# Patient Record
Sex: Female | Born: 1935 | Race: White | Hispanic: No | Smoking: Former smoker
Health system: Southern US, Community
[De-identification: ages and names within clinical notes are randomized; demographics above are authoritative.]

## PROBLEM LIST (undated history)

## (undated) DIAGNOSIS — E041 Nontoxic single thyroid nodule: Secondary | ICD-10-CM

## (undated) DIAGNOSIS — C801 Malignant (primary) neoplasm, unspecified: Secondary | ICD-10-CM

## (undated) DIAGNOSIS — D179 Benign lipomatous neoplasm, unspecified: Secondary | ICD-10-CM

## (undated) DIAGNOSIS — E669 Obesity, unspecified: Secondary | ICD-10-CM

## (undated) DIAGNOSIS — K760 Fatty (change of) liver, not elsewhere classified: Secondary | ICD-10-CM

## (undated) DIAGNOSIS — M1990 Unspecified osteoarthritis, unspecified site: Secondary | ICD-10-CM

## (undated) DIAGNOSIS — E785 Hyperlipidemia, unspecified: Secondary | ICD-10-CM

## (undated) DIAGNOSIS — E042 Nontoxic multinodular goiter: Secondary | ICD-10-CM

## (undated) DIAGNOSIS — O020 Blighted ovum and nonhydatidiform mole: Secondary | ICD-10-CM

## (undated) DIAGNOSIS — K219 Gastro-esophageal reflux disease without esophagitis: Secondary | ICD-10-CM

## (undated) DIAGNOSIS — K5000 Crohn's disease of small intestine without complications: Secondary | ICD-10-CM

## (undated) DIAGNOSIS — E559 Vitamin D deficiency, unspecified: Secondary | ICD-10-CM

## (undated) DIAGNOSIS — N189 Chronic kidney disease, unspecified: Secondary | ICD-10-CM

## (undated) DIAGNOSIS — E119 Type 2 diabetes mellitus without complications: Secondary | ICD-10-CM

## (undated) DIAGNOSIS — I10 Essential (primary) hypertension: Secondary | ICD-10-CM

## (undated) DIAGNOSIS — J189 Pneumonia, unspecified organism: Secondary | ICD-10-CM

## (undated) DIAGNOSIS — F411 Generalized anxiety disorder: Secondary | ICD-10-CM

## (undated) DIAGNOSIS — D649 Anemia, unspecified: Secondary | ICD-10-CM

## (undated) DIAGNOSIS — F329 Major depressive disorder, single episode, unspecified: Secondary | ICD-10-CM

## (undated) HISTORY — DX: Obesity, unspecified: E66.9

## (undated) HISTORY — PX: APPENDECTOMY: SHX54

## (undated) HISTORY — DX: Fatty (change of) liver, not elsewhere classified: K76.0

## (undated) HISTORY — DX: Anemia, unspecified: D64.9

## (undated) HISTORY — DX: Generalized anxiety disorder: F41.1

## (undated) HISTORY — DX: Major depressive disorder, single episode, unspecified: F32.9

## (undated) HISTORY — DX: Hyperlipidemia, unspecified: E78.5

## (undated) HISTORY — DX: Crohn's disease of small intestine without complications: K50.00

## (undated) HISTORY — DX: Gastro-esophageal reflux disease without esophagitis: K21.9

## (undated) HISTORY — PX: MECKEL DIVERTICULUM EXCISION: SHX314

## (undated) HISTORY — DX: Type 2 diabetes mellitus without complications: E11.9

## (undated) HISTORY — DX: Vitamin D deficiency, unspecified: E55.9

## (undated) HISTORY — DX: Nontoxic multinodular goiter: E04.2

## (undated) HISTORY — PX: BIOPSY THYROID: PRO38

## (undated) HISTORY — DX: Essential (primary) hypertension: I10

## (undated) HISTORY — DX: Benign lipomatous neoplasm, unspecified: D17.9

---
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
## 1965-09-28 DIAGNOSIS — O020 Blighted ovum and nonhydatidiform mole: Secondary | ICD-10-CM

## 1965-09-28 HISTORY — DX: Blighted ovum and nonhydatidiform mole: O02.0

## 1990-09-28 DIAGNOSIS — K760 Fatty (change of) liver, not elsewhere classified: Secondary | ICD-10-CM

## 1990-09-28 HISTORY — DX: Fatty (change of) liver, not elsewhere classified: K76.0

## 1990-09-28 HISTORY — PX: LEFT OOPHORECTOMY: SHX1961

## 1999-02-07 ENCOUNTER — Other Ambulatory Visit: Admission: RE | Admit: 1999-02-07 | Discharge: 1999-02-07 | Payer: Self-pay | Admitting: Gastroenterology

## 2005-09-07 ENCOUNTER — Ambulatory Visit: Payer: Self-pay | Admitting: Gastroenterology

## 2005-09-14 ENCOUNTER — Ambulatory Visit: Payer: Self-pay | Admitting: Gastroenterology

## 2005-09-29 ENCOUNTER — Ambulatory Visit: Payer: Self-pay | Admitting: Endocrinology

## 2005-10-06 ENCOUNTER — Encounter: Admission: RE | Admit: 2005-10-06 | Discharge: 2006-01-04 | Payer: Self-pay | Admitting: Endocrinology

## 2005-10-08 ENCOUNTER — Ambulatory Visit (HOSPITAL_COMMUNITY): Admission: RE | Admit: 2005-10-08 | Discharge: 2005-10-08 | Payer: Self-pay | Admitting: Endocrinology

## 2005-10-12 ENCOUNTER — Ambulatory Visit: Payer: Self-pay | Admitting: Endocrinology

## 2005-10-19 ENCOUNTER — Encounter: Admission: RE | Admit: 2005-10-19 | Discharge: 2005-10-19 | Payer: Self-pay | Admitting: Endocrinology

## 2005-10-19 ENCOUNTER — Other Ambulatory Visit: Admission: RE | Admit: 2005-10-19 | Discharge: 2005-10-19 | Payer: Self-pay | Admitting: Interventional Radiology

## 2005-10-19 ENCOUNTER — Encounter (INDEPENDENT_AMBULATORY_CARE_PROVIDER_SITE_OTHER): Payer: Self-pay | Admitting: *Deleted

## 2005-10-20 ENCOUNTER — Ambulatory Visit: Payer: Self-pay | Admitting: Gastroenterology

## 2005-10-23 ENCOUNTER — Ambulatory Visit: Payer: Self-pay

## 2005-10-26 ENCOUNTER — Ambulatory Visit: Payer: Self-pay | Admitting: Endocrinology

## 2005-12-08 ENCOUNTER — Ambulatory Visit: Payer: Self-pay | Admitting: Gastroenterology

## 2005-12-12 ENCOUNTER — Emergency Department (HOSPITAL_COMMUNITY): Admission: EM | Admit: 2005-12-12 | Discharge: 2005-12-12 | Payer: Self-pay | Admitting: Emergency Medicine

## 2006-01-26 ENCOUNTER — Ambulatory Visit: Payer: Self-pay | Admitting: Endocrinology

## 2006-02-02 ENCOUNTER — Ambulatory Visit: Payer: Self-pay | Admitting: Endocrinology

## 2006-04-26 ENCOUNTER — Ambulatory Visit: Payer: Self-pay | Admitting: Endocrinology

## 2006-05-04 ENCOUNTER — Ambulatory Visit: Payer: Self-pay | Admitting: Endocrinology

## 2006-07-27 ENCOUNTER — Ambulatory Visit: Payer: Self-pay | Admitting: Endocrinology

## 2006-08-02 ENCOUNTER — Ambulatory Visit: Payer: Self-pay | Admitting: Endocrinology

## 2006-08-02 ENCOUNTER — Ambulatory Visit: Payer: Self-pay | Admitting: Gastroenterology

## 2006-08-10 ENCOUNTER — Ambulatory Visit: Payer: Self-pay | Admitting: Gastroenterology

## 2006-08-10 HISTORY — PX: COLONOSCOPY: SHX5424

## 2006-08-10 LAB — HM COLONOSCOPY: HM Colonoscopy: NORMAL

## 2007-01-21 ENCOUNTER — Encounter: Admission: RE | Admit: 2007-01-21 | Discharge: 2007-03-28 | Payer: Self-pay | Admitting: Endocrinology

## 2007-02-04 ENCOUNTER — Ambulatory Visit: Payer: Self-pay | Admitting: Endocrinology

## 2007-02-04 ENCOUNTER — Ambulatory Visit: Payer: Self-pay | Admitting: Internal Medicine

## 2007-02-04 LAB — CONVERTED CEMR LAB
AST: 29 units/L (ref 0–37)
BUN: 18 mg/dL (ref 6–23)
Basophils Absolute: 0.1 10*3/uL (ref 0.0–0.1)
CO2: 30 meq/L (ref 19–32)
Calcium: 9.9 mg/dL (ref 8.4–10.5)
Cholesterol: 140 mg/dL (ref 0–200)
Creatinine,U: 183.2 mg/dL
HCT: 34.6 % — ABNORMAL LOW (ref 36.0–46.0)
Hemoglobin, Urine: NEGATIVE
Hgb A1c MFr Bld: 6.8 % — ABNORMAL HIGH (ref 4.6–6.0)
LDL Cholesterol: 55 mg/dL (ref 0–99)
MCHC: 34.2 g/dL (ref 30.0–36.0)
MCV: 88.8 fL (ref 78.0–100.0)
Microalb, Ur: 1.1 mg/dL (ref 0.0–1.9)
Neutro Abs: 3.8 10*3/uL (ref 1.4–7.7)
Platelets: 271 10*3/uL (ref 150–400)
RBC: 3.9 M/uL (ref 3.87–5.11)
TSH: 1.8 microintl units/mL (ref 0.35–5.50)
Total Protein: 6.8 g/dL (ref 6.0–8.3)
Urobilinogen, UA: 0.2 (ref 0.0–1.0)
VLDL: 38 mg/dL (ref 0–40)
WBC: 7.4 10*3/uL (ref 4.5–10.5)
pH: 5.5 (ref 5.0–8.0)

## 2007-02-11 ENCOUNTER — Ambulatory Visit: Payer: Self-pay | Admitting: Endocrinology

## 2007-03-09 ENCOUNTER — Ambulatory Visit: Payer: Self-pay | Admitting: Gastroenterology

## 2007-03-09 LAB — CONVERTED CEMR LAB
Basophils Absolute: 0.1 10*3/uL (ref 0.0–0.1)
Folate: >20 ng/mL
HCT: 34.5 % — ABNORMAL LOW (ref 36.0–46.0)
Lymphocytes Relative: 26 % (ref 12.0–46.0)
MCHC: 35 g/dL (ref 30.0–36.0)
Monocytes Relative: 3.3 % (ref 3.0–11.0)
Neutro Abs: 5.8 10*3/uL (ref 1.4–7.7)
Saturation Ratios: 19.1 % — ABNORMAL LOW (ref 20.0–50.0)
Transferrin: 377.6 mg/dL — ABNORMAL HIGH (ref >=212.0)
Vitamin B-12: 292 pg/mL (ref 211–911)

## 2007-04-11 ENCOUNTER — Ambulatory Visit: Payer: Self-pay | Admitting: Internal Medicine

## 2007-04-27 ENCOUNTER — Encounter: Payer: Self-pay | Admitting: Endocrinology

## 2007-04-27 DIAGNOSIS — F411 Generalized anxiety disorder: Secondary | ICD-10-CM | POA: Insufficient documentation

## 2007-04-27 DIAGNOSIS — E119 Type 2 diabetes mellitus without complications: Secondary | ICD-10-CM

## 2007-04-27 DIAGNOSIS — F329 Major depressive disorder, single episode, unspecified: Secondary | ICD-10-CM

## 2007-04-27 DIAGNOSIS — F3289 Other specified depressive episodes: Secondary | ICD-10-CM

## 2007-04-27 DIAGNOSIS — I10 Essential (primary) hypertension: Secondary | ICD-10-CM | POA: Insufficient documentation

## 2007-04-27 HISTORY — DX: Other specified depressive episodes: F32.89

## 2007-04-27 HISTORY — DX: Generalized anxiety disorder: F41.1

## 2007-04-27 HISTORY — DX: Major depressive disorder, single episode, unspecified: F32.9

## 2008-01-19 DIAGNOSIS — K219 Gastro-esophageal reflux disease without esophagitis: Secondary | ICD-10-CM | POA: Insufficient documentation

## 2008-05-04 ENCOUNTER — Ambulatory Visit: Payer: Self-pay | Admitting: Endocrinology

## 2008-05-04 DIAGNOSIS — E042 Nontoxic multinodular goiter: Secondary | ICD-10-CM

## 2008-05-04 DIAGNOSIS — D649 Anemia, unspecified: Secondary | ICD-10-CM | POA: Insufficient documentation

## 2008-05-04 HISTORY — DX: Nontoxic multinodular goiter: E04.2

## 2008-05-04 HISTORY — DX: Anemia, unspecified: D64.9

## 2008-05-10 ENCOUNTER — Telehealth: Payer: Self-pay | Admitting: Endocrinology

## 2008-05-15 ENCOUNTER — Encounter: Payer: Self-pay | Admitting: Internal Medicine

## 2008-05-15 ENCOUNTER — Ambulatory Visit: Payer: Self-pay | Admitting: Endocrinology

## 2008-05-15 ENCOUNTER — Encounter: Admission: RE | Admit: 2008-05-15 | Discharge: 2008-05-15 | Payer: Self-pay | Admitting: Obstetrics and Gynecology

## 2008-06-08 ENCOUNTER — Ambulatory Visit: Payer: Self-pay | Admitting: Internal Medicine

## 2008-06-08 DIAGNOSIS — K5000 Crohn's disease of small intestine without complications: Secondary | ICD-10-CM

## 2008-06-08 HISTORY — DX: Crohn's disease of small intestine without complications: K50.00

## 2008-07-24 ENCOUNTER — Telehealth: Payer: Self-pay | Admitting: Endocrinology

## 2008-09-03 ENCOUNTER — Encounter: Payer: Self-pay | Admitting: Endocrinology

## 2008-09-07 ENCOUNTER — Encounter: Payer: Self-pay | Admitting: Endocrinology

## 2009-04-22 ENCOUNTER — Telehealth: Payer: Self-pay | Admitting: Endocrinology

## 2009-05-30 ENCOUNTER — Telehealth: Payer: Self-pay | Admitting: Endocrinology

## 2009-06-07 ENCOUNTER — Ambulatory Visit: Payer: Self-pay | Admitting: Internal Medicine

## 2009-06-07 ENCOUNTER — Encounter: Payer: Self-pay | Admitting: Endocrinology

## 2009-06-10 ENCOUNTER — Encounter: Payer: Self-pay | Admitting: Endocrinology

## 2009-06-17 ENCOUNTER — Ambulatory Visit: Payer: Self-pay | Admitting: Endocrinology

## 2009-06-23 ENCOUNTER — Encounter: Payer: Self-pay | Admitting: Endocrinology

## 2009-06-23 DIAGNOSIS — K7581 Nonalcoholic steatohepatitis (NASH): Secondary | ICD-10-CM | POA: Insufficient documentation

## 2009-07-08 ENCOUNTER — Ambulatory Visit: Payer: Self-pay | Admitting: Internal Medicine

## 2010-03-26 ENCOUNTER — Telehealth: Payer: Self-pay | Admitting: Endocrinology

## 2010-06-24 ENCOUNTER — Ambulatory Visit: Payer: Self-pay | Admitting: Endocrinology

## 2010-06-24 ENCOUNTER — Encounter: Payer: Self-pay | Admitting: Endocrinology

## 2010-06-24 DIAGNOSIS — E7800 Pure hypercholesterolemia, unspecified: Secondary | ICD-10-CM | POA: Insufficient documentation

## 2010-07-01 ENCOUNTER — Telehealth: Payer: Self-pay | Admitting: Endocrinology

## 2010-07-01 ENCOUNTER — Telehealth: Payer: Self-pay | Admitting: Internal Medicine

## 2010-07-29 ENCOUNTER — Telehealth: Payer: Self-pay | Admitting: Endocrinology

## 2010-08-01 ENCOUNTER — Ambulatory Visit: Payer: Self-pay | Admitting: Internal Medicine

## 2010-08-01 DIAGNOSIS — E669 Obesity, unspecified: Secondary | ICD-10-CM | POA: Insufficient documentation

## 2010-08-01 HISTORY — DX: Obesity, unspecified: E66.9

## 2010-10-26 LAB — CONVERTED CEMR LAB
Albumin: 4.2 g/dL (ref 3.5–5.2)
Alkaline Phosphatase: 100 units/L (ref 39–117)
Alkaline Phosphatase: 88 units/L (ref 39–117)
Alkaline Phosphatase: 94 units/L (ref 39–117)
BUN: 26 mg/dL — ABNORMAL HIGH (ref 6–23)
BUN: 27 mg/dL — ABNORMAL HIGH (ref 6–23)
Basophils Absolute: 0 10*3/uL (ref 0.0–0.1)
Bilirubin, Direct: 0.1 mg/dL (ref 0.0–0.3)
CO2: 27 meq/L (ref 19–32)
CO2: 28 meq/L (ref 19–32)
Calcium: 9.5 mg/dL (ref 8.4–10.5)
Chloride: 103 meq/L (ref 96–112)
Cholesterol: 178 mg/dL (ref 0–200)
Creatinine, Ser: 1.1 mg/dL (ref 0.4–1.2)
Creatinine, Ser: 1.2 mg/dL (ref 0.4–1.2)
Creatinine, Ser: 1.3 mg/dL — ABNORMAL HIGH (ref 0.4–1.2)
Creatinine,U: 336.8 mg/dL
Creatinine,U: 729.3 mg/dL
Crystals: NEGATIVE
Direct LDL: 51.8 mg/dL
Direct LDL: 71.6 mg/dL
Eosinophils Relative: 4.7 % (ref 0.0–5.0)
Eosinophils Relative: 4.8 % (ref 0.0–5.0)
Folate: >20 ng/mL
GFR calc Af Amer: 63 mL/min
GFR calc non Af Amer: 52 mL/min
Glucose, Bld: 140 mg/dL — ABNORMAL HIGH (ref 70–99)
HCT: 34.8 % — ABNORMAL LOW (ref 36.0–46.0)
HCT: 37.5 % (ref 36.0–46.0)
Hemoglobin, Urine: NEGATIVE
Hemoglobin: 11.9 g/dL — ABNORMAL LOW (ref 12.0–15.0)
Hemoglobin: 12 g/dL (ref 12.0–15.0)
Hgb A1c MFr Bld: 7.4 % — ABNORMAL HIGH (ref 4.6–6.5)
Lymphocytes Relative: 25.6 % (ref 12.0–46.0)
Lymphocytes Relative: 27.8 % (ref 12.0–46.0)
Lymphocytes Relative: 29.2 % (ref 12.0–46.0)
MCHC: 33.9 g/dL (ref 30.0–36.0)
Microalb Creat Ratio: 2.4 mg/g (ref 0.0–30.0)
Microalb Creat Ratio: 2.6 mg/g (ref 0.0–30.0)
Microalb, Ur: 17.6 mg/dL — ABNORMAL HIGH (ref 0.0–1.9)
Monocytes Absolute: 0.4 10*3/uL (ref 0.1–1.0)
Monocytes Relative: 6 % (ref 3.0–12.0)
Neutro Abs: 4.7 10*3/uL (ref 1.4–7.7)
Neutro Abs: 5 10*3/uL (ref 1.4–7.7)
Neutrophils Relative %: 62.5 % (ref 43.0–77.0)
Neutrophils Relative %: 63.4 % (ref 43.0–77.0)
Nitrite: NEGATIVE
Nitrite: NEGATIVE
Platelets: 213 10*3/uL (ref 150–400)
Platelets: 235 10*3/uL (ref 150.0–400.0)
Platelets: 250 10*3/uL (ref 150.0–400.0)
Potassium: 4.5 meq/L (ref 3.5–5.1)
RBC: 4.19 M/uL (ref 3.87–5.11)
RDW: 12.5 % (ref 11.5–14.6)
RDW: 13.1 % (ref 11.5–14.6)
Sodium: 139 meq/L (ref 135–145)
Specific Gravity, Urine: >=1.03 (ref 1.000–1.030)
TSH: 2.33 microintl units/mL (ref 0.35–5.50)
TSH: 2.68 microintl units/mL (ref 0.35–5.50)
Total Bilirubin: 0.5 mg/dL (ref 0.3–1.2)
Total Bilirubin: 0.8 mg/dL (ref 0.3–1.2)
Total CHOL/HDL Ratio: 2.7
Total CHOL/HDL Ratio: 4
Total Protein, Urine: NEGATIVE mg/dL
Total Protein: 7.5 g/dL (ref 6.0–8.3)
Total Protein: 7.8 g/dL (ref 6.0–8.3)
Transferrin: 363.1 mg/dL — ABNORMAL HIGH (ref >=212.0)
Transferrin: 372.8 mg/dL — ABNORMAL HIGH (ref 212.0–360.0)
Triglycerides: 222 mg/dL — ABNORMAL HIGH (ref 0.0–149.0)
Triglycerides: 272 mg/dL (ref 0–149)
Urine Glucose: NEGATIVE mg/dL
Urine Glucose: NEGATIVE mg/dL
Urobilinogen, UA: 0.2 (ref 0.0–1.0)
Urobilinogen, UA: 0.2 (ref 0.0–1.0)
VLDL: 54 mg/dL — ABNORMAL HIGH (ref 0–40)
Vitamin B-12: 1420 pg/mL — ABNORMAL HIGH (ref 211–911)
WBC: 7.9 10*3/uL (ref 4.5–10.5)
WBC: 7.9 10*3/uL (ref 4.5–10.5)
pH: 5 (ref 5.0–8.0)
pH: 5 (ref 5.0–8.0)
pH: 5 (ref 5.0–8.0)

## 2010-10-28 NOTE — Progress Notes (Signed)
Summary: metformin/prandin/actos rx  Phone Note Refill Request Message from:  Fax from Pharmacy  Refills Requested: Medication #1:  ACTOS 45 MG  TABS take 1 by mouth qd   Dosage confirmed as above?Dosage Confirmed   Last Refilled: 07/24/2009  Medication #2:  PRANDIN 2 MG  TABS take 1 three times a day qd   Dosage confirmed as above?Dosage Confirmed   Last Refilled: 07/24/2009  Medication #3:  METFORMIN HCL 500 MG  TB24 take 2 by mouth bid   Dosage confirmed as above?Dosage Confirmed   Last Refilled: 07/24/2009 Initial call taken by: Rebeca Alert MA,  March 26, 2010 9:14 AM    Prescriptions: PRANDIN 2 MG  TABS (REPAGLINIDE) take 1 three times a day qd  #270 x 0   Entered by:   Rebeca Alert MA   Authorized by:   Donavan Foil MD   Signed by:   Rebeca Alert MA on 03/26/2010   Method used:   Faxed to ...       [facility] (retail)       [address] [place], [place]  [id number]       Ph: [phone]       Fax: [phone]   RxID:   [id number] METFORMIN HCL 500 MG  TB24 (METFORMIN HCL) take 2 by mouth bid  #360 x 0   Entered by:   Rebeca Alert MA   Authorized by:   Donavan Foil MD   Signed by:   Rebeca Alert MA on 03/26/2010   Method used:   Faxed to ...       [facility] (retail)       [address], [place]  [address]       Ph: [phone]       Fax: [phone]   RxID:   [id number] ACTOS 45 MG  TABS (PIOGLITAZONE HCL) take 1 by mouth qd  #90 x 0   Entered by:   Rebeca Alert MA   Authorized by:   Donavan Foil MD   Signed by:   Rebeca Alert MA on 03/26/2010   Method used:   Faxed to ...       [facility] (retail)       [address], [place]  [id number]       Ph: [phone]       Fax: [phone]   RxID:   [id number]

## 2010-10-28 NOTE — Assessment & Plan Note (Signed)
Summary: yearly check up...em   History of Present Illness Visit Type: Follow-up Visit Primary GI MD: Silvano Rusk MD [facility] Primary Lene Mckay: Luisa Hart Requesting Khalid Lacko: n/a Chief Complaint: Crohn's and GERD History of Present Illness:   75 yo ww with Crohn's disease in remission and GERD. She has no diarrhea or abdominal pain problems. She does well on very low-dose Pentasa and Prilosec OTC.         Preventive Screening-Counseling & Management  Caffeine-Diet-Exercise     Diet Counseling: to improve diet; diet is suboptimal     Nutrition Referrals: yes     Does Patient Exercise: no     Exercise Counseling: to improve exercise regimen  Comments: Asvised weight watchers    Clinical Reports Reviewed:  Colonoscopy:  08/10/2006:  Normal Exam Performed by Dr. Velora Heckler   Current Medications (verified): 1)  Multivitamins   Tabs (Multiple Vitamin) .... Take 1 By Mouth Qd 2)  Actos 45 Mg  Tabs (Pioglitazone Hcl) .... Take 1 By Mouth Qd 3)  Metformin Hcl 500 Mg  Tb24 (Metformin Hcl) .... Take 2 By Mouth Bid 4)  Prandin 2 Mg  Tabs (Repaglinide) .... Take 1 Three Times A Day Qd 5)  Micardis Hct 80-25 Mg  Tabs (Telmisartan-Hctz) .... Take 1 By Mouth Qd 6)  Folic Acid 093 Mcg Tabs (Folic Acid) .Marland Kitchen.. 1 Tablet By Mouth Once Daily 7)  Pravastatin Sodium 40 Mg  Tabs (Pravastatin Sodium) .... Take 1 By Mouth Qhs 8)  Metoprolol Tartrate 25 Mg  Tabs (Metoprolol Tartrate) .... 1/2 Tab Bid 9)  Alprazolam 0.25 Mg  Tabs (Alprazolam) .... As Needed Anxiety 10)  Pentasa 500 Mg Cr-Caps (Mesalamine) .... Take 1 Two Times A Day 11)  Vitamin B-12 2500 Mcg Subl (Cyanocobalamin) .Marland Kitchen.. 1 By Mouth in The Morning 12)  Prilosec Otc 20 Mg  Tbec (Omeprazole Magnesium) .Marland Kitchen.. 1 Each Day 30 Minutes Before Meal 13)  Glucose Test Strips .... Once Daily, and Lancets, Any Brand 250.00 14)  Biotin 5000 5 Mg Caps (Biotin) .Marland Kitchen.. 1 Tab Qam 15)  Onetouch Test  Strp (Glucose Blood) .... Check Blood Sugar Once  Daily 250.00  Allergies (verified): No Known Drug Allergies  Past History:  Past Medical History: Diabetes mellitus, type II Crohn's Disease, ileum, cecum Mulit-Nodular Goiter Cough due to Zestril Hypertension Obesity Mild Anemia Fatty liver Anxiety Depression THYROID BX  specialists: gessner henley bertrand jacklin  Past Surgical History: Reviewed history from 01/19/2008 and no changes required. Stress Cardiolite (10/23/2005) EKG (02/11/2007) Appendectomy Ileal and cecal resection w/Meckel's diverticulum resection and left oophorectomy(1992)  Family History: Reviewed history from 06/08/2008 and no changes required. mother had breast cancer at age 22 father had skin cancer Family History of Colon Cancer: uncle Family History of Kidney Disease:mother Family History of Diabetes:   Social History: Reviewed history from 06/24/2010 and no changes required. 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  Alcohol Use - no Illicit Drug Use - no Patient does not get regular exercise.   Review of Systems       joint pains, especially lower extremities she has had some falls with temporary injury  Vital Signs:  Patient profile:   75 year old female Height:      67 inches Weight:      244.13 pounds BMI:     38.37 Pulse rate:   90 / minute Pulse rhythm:   regular BP sitting:   124 / 64  (left arm) Cuff size:   large  Vitals Entered By: Marlon Pel CMA Deborra Medina) (August 01, 2010 4:04 PM)  Physical Exam  General:  obese.  NAD Lungs:  Clear throughout to auscultation. Heart:  Regular rate and rhythm; no murmurs, rubs,  or bruits. Abdomen:  obese, soft non-tener without obvious HSM no masses or herniae midline scar Psych:  Alert and cooperative. Normal mood and affect.   Impression & Recommendations:  Problem # 1:  CROHN'S DISEASE, SMALL INTESTINE (ICD-555.0) Assessment Unchanged  Problem # 2:  GERD (ICD-530.81) Assessment:  Unchanged  Problem # 3:  OBESITY (ICD-278.00) Assessment: Deteriorated she is gaining weight she had trie Alli but ended up cooking and eating more she thinks is aware of problem and had lost in the past with Weight Watchers - recommended to retry that advised that even a 10# weight loss could help with comorbidities due to obesitty she was receptive  Problem # 4:  DIABETES MELLITUS, TYPE II (ICD-250.00) Assessment: Comment Only We reviewed her Hgb A1C She asked why she was only getting it checked annually.  Patient Instructions: 1)  Your Pentasa and Prilosec OTC have been refilled for 1 year. 2)  You should have a colonoscpy in (around) November 2012 - you should receive a letter. Dr. Carlean Purl can see you next at that time and sooner if needed. 3)  Try Weight Watchers to lose weight and here are some other hnts. 4)  You need to lose weight. Start by limiting portions, amounts. Avoid eating when not hungry. Limit desserts.Look for high fructose corn syrup on food labels and if in first 3 ingredients, avoid that food. Also try to eat whole grains, avoid "white foods" (e.g. white rice, white bread).   5)  The medication list was reviewed and reconciled.  All changed / newly prescribed medications were explained.  A complete medication list was provided to the patient / caregiver. Prescriptions: PRILOSEC OTC 20 MG  TBEC (OMEPRAZOLE MAGNESIUM) 1 each day 30 minutes before meal  #30 x 11   Entered and Authorized by:   Gatha Mayer MD, [facility]   Signed by:   Gatha Mayer MD, FACG on 08/01/2010   Method used:   Electronically to        [facility] (retail)       [address], [place]  [address]       Ph: [phone]       Fax: [phone]   RxID:   [id number] PENTASA 500 MG CR-CAPS (MESALAMINE) take 1 two times a day  #60 Each x 11   Entered and Authorized by:   Gatha Mayer MD, [facility]   Signed by:   Gatha Mayer MD, FACG on 08/01/2010   Method  used:   Electronically to        [facility] (retail)       [place], [place]  [address]       Ph: [phone]       Fax: [phone]   RxID:   [id number]

## 2010-10-28 NOTE — Assessment & Plan Note (Signed)
Summary: YEARLY FU/ MEDICARE /NWS  #   Vital Signs:  Patient profile:   75 year old female Height:      67 inches (170.18 cm) Weight:      238 pounds (108.18 kg) BMI:     37.41 O2 Sat:      96 % on Room air Temp:     98.5 degrees F (36.94 degrees C) oral Pulse rate:   88 / minute Pulse rhythm:   regular BP sitting:   108 / 64  (left arm) Cuff size:   large  Vitals Entered By: Rebeca Alert MA (June 24, 2010 8:35 AM)  O2 Flow:  Room air CC: Yearly Medicare Visit/pt is no longer taking Alli/aj Is Patient Diabetic? Yes   Primary Provider:  Luisa Hart  CC:  Yearly Medicare Visit/pt is no longer taking Alli/aj.  History of Present Illness: pt is here for medicare welllness visit.  she denies memory loss and depression.  she says she is able to perform activities of daily living without assistance.  she has no limitations to physical activity.   Current Medications (verified): 1)  Multivitamins   Tabs (Multiple Vitamin) .... Take 1 By Mouth Qd 2)  Actos 45 Mg  Tabs (Pioglitazone Hcl) .... Take 1 By Mouth Qd 3)  Metformin Hcl 500 Mg  Tb24 (Metformin Hcl) .... Take 2 By Mouth Bid 4)  Prandin 2 Mg  Tabs (Repaglinide) .... Take 1 Three Times A Day Qd 5)  Micardis Hct 80-25 Mg  Tabs (Telmisartan-Hctz) .... Take 1 By Mouth Qd 6)  Folic Acid 840 Mcg Tabs (Folic Acid) .Marland Kitchen.. 1 Tablet By Mouth Once Daily 7)  Pravastatin Sodium 40 Mg  Tabs (Pravastatin Sodium) .... Take 1 By Mouth Qhs 8)  Metoprolol Tartrate 25 Mg  Tabs (Metoprolol Tartrate) .... 1/2 Tab Bid 9)  Alprazolam 0.25 Mg  Tabs (Alprazolam) .... As Needed Anxiety 10)  Pentasa 500 Mg Cr-Caps (Mesalamine) .... Take 1 Two Times A Day 11)  Alli .Marland Kitchen.. 1 Before Each Meal 12)  Vitamin B-12 2500 Mcg Subl (Cyanocobalamin) .Marland Kitchen.. 1 By Mouth in The Morning 13)  Prilosec Otc 20 Mg  Tbec (Omeprazole Magnesium) .Marland Kitchen.. 1 Each Day 30 Minutes Before Meal 14)  Glucose Test Strips .... Once Daily, and Lancets, Any Brand 250.00 15)  Biotin  5000 5 Mg Caps (Biotin) .Marland Kitchen.. 1 Tab Qam 16)  Onetouch Test  Strp (Glucose Blood) .... Check Blood Sugar Once Daily 250.00  Allergies (verified): No Known Drug Allergies  Past History:  Past Medical History: Diabetes mellitus, type II Crohn's Disease, ileum, cecum NASH Mulit-Nodular Goiter Cough due to Zestril Hypertension Obesity Mild Anemia Fatty liver Anxiety Depression THYROID BX  specialists: gessner henley bertrand jacklin  Family History: Reviewed history from 06/08/2008 and no changes required. mother had breast cancer at age 66 father had skin cancer Family History of Colon Cancer: uncle Family History of Kidney Disease:mother Family History of Diabetes:   Social History: Reviewed history from 06/08/2008 and no changes required. 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  Alcohol Use - no Illicit Drug Use - no Patient does not get regular exercise.   Review of Systems       normal vision with glasses  Physical Exam  General:  normal appearance.   Eyes:  (sees opthal) Breasts:  sees gyn  Rectal:  sees gyn  Genitalia:  sees gyn  Msk:  pt easily and quickly performs "get-up-and-go" from a sitting position Psych:  remembers 3/3 at 5 minutes.  excellent recall.  can easily read and write a sentence.  alert and oriented x 3    Impression & Recommendations:  Problem # 1:  ROUTINE GENERAL MEDICAL EXAM@HEALTH  CARE FACL (ICD-V70.0)  Medications Added to Medication List This Visit: 1)  Folic Acid 132 Mcg Tabs (Folic acid) .Marland Kitchen.. 1 tablet by mouth once daily 2)  Vitamin B-12 2500 Mcg Subl (Cyanocobalamin) .Marland Kitchen.. 1 by mouth in the morning  Other Orders: EKG w/ Interpretation (93000) TLB-Lipid Panel (80061-LIPID) TLB-BMP (Basic Metabolic Panel-BMET) (44010-UVOZDGU) TLB-CBC Platelet - w/Differential (85025-CBCD) TLB-Hepatic/Liver Function Pnl (80076-HEPATIC) TLB-TSH (Thyroid Stimulating Hormone) (84443-TSH) TLB-A1C / Hgb A1C  (Glycohemoglobin) (83036-A1C) TLB-Microalbumin/Creat Ratio, Urine (82043-MALB) TLB-Udip w/ Micro (81001-URINE) TLB-IBC Pnl (Iron/FE;Transferrin) (83550-IBC) TLB-B12, Serum-Total ONLY (44034-V42) Medicare -1st Annual Wellness Visit [phone])   Patient Instructions: 1)  blood tests are being ordered for you today.  please call [phone] to hear your test results. 2)  please consider these measures for your health:  minimize alcohol.  do not use tobacco products.  have a colonoscopy at least every 10 years from age 35.  keep firearms safely stored.  always use seat belts.  have working smoke alarms in your home.  see an eye doctor and dentist regularly.  never drive under the influence of alcohol or drugs (including prescription drugs).  those with fair skin should take precautions against the sun. 3)  please let me know what your wishes would be, if artificial life support measures should become necessary.  it is critically important to prevent falling down (keep floor areas well-lit, dry, and free of loose objects) 4)  Please schedule a follow-up appointment in 6 months. 5)  it is critically important to maintain a healthy body weight.  i would be happy to refer you to a dietician.  we are checking your diabetes and cholesterol today. 6)  here is a list of medicare-covered preventive services. 7)  (update: i left message on phone-tree:  i advised addition of parlodel or welchol).

## 2010-10-28 NOTE — Progress Notes (Signed)
  Phone Note Refill Request Message from:  Fax from Pharmacy on July 01, 2010 10:36 AM  Refills Requested: Medication #1:  ACTOS 45 MG  TABS take 1 by mouth qd   Dosage confirmed as above?Dosage Confirmed   Last Refilled: 02/2010   Notes: [facility] Initial call taken by: Shirlean Mylar Ewing CMA Deborra Medina),  July 01, 2010 10:37 AM    Prescriptions: ACTOS 45 MG  TABS (PIOGLITAZONE HCL) take 1 by mouth qd  #90 x 3   Entered by:   Sharon Seller CMA ([place])   Authorized by:   Donavan Foil MD   Signed by:   Sharon Seller CMA ([place]) on 07/01/2010   Method used:   Faxed to ...       [facility] (retail)       [address], [place]  [id number]       Ph: [phone]       Fax: [phone]   RxID:   [id number]

## 2010-10-28 NOTE — Progress Notes (Signed)
  Phone Note Refill Request Message from:  Fax from Pharmacy on July 01, 2010 10:38 AM  Refills Requested: Medication #1:  METOPROLOL TARTRATE 25 MG  TABS 1/2 tab bid   Dosage confirmed as above?Dosage Confirmed   Notes: [facility] Initial call taken by: Shirlean Mylar Ewing CMA Deborra Medina),  July 01, 2010 10:38 AM    Prescriptions: METOPROLOL TARTRATE 25 MG  TABS (METOPROLOL TARTRATE) 1/2 tab bid  #30 Each x 10   Entered by:   Shirlean Mylar Ewing CMA ([place])   Authorized by:   Donavan Foil MD   Signed by:   Sharon Seller CMA ([place]) on 07/01/2010   Method used:   Faxed to ...       [facility] (retail)       [address], [place]  [id number]       Ph: [phone]       Fax: [phone]   RxID:   [id number]

## 2010-10-28 NOTE — Progress Notes (Signed)
  Phone Note Refill Request Message from:  Fax from Pharmacy on July 01, 2010 11:34 AM  Refills Requested: Medication #1:  MICARDIS HCT 80-25 MG  TABS take 1 by mouth qd   Dosage confirmed as above?Dosage Confirmed   Last Refilled: 08/26/2009   Notes: [facility] Initial call taken by: Shirlean Mylar Ewing CMA Deborra Medina),  July 01, 2010 11:34 AM    Prescriptions: MICARDIS HCT 80-25 MG  TABS (TELMISARTAN-HCTZ) take 1 by mouth qd  #30 Each x 11   Entered by:   Shirlean Mylar Ewing CMA ([place])   Authorized by:   Biagio Borg MD   Signed by:   Sharon Seller CMA ([place]) on 07/01/2010   Method used:   Faxed to ...       [facility] (retail)       [address] [place], [place]  [address]       Ph: [phone]       Fax: [phone]   RxID:   [id number]

## 2010-10-28 NOTE — Progress Notes (Signed)
Summary: prandin  Phone Note Refill Request Message from:  Pharmacy on July 29, 2010 8:36 AM  Refills Requested: Medication #1:  PRANDIN 2 MG  TABS take 1 three times a day qd   Dosage confirmed as above?Dosage Confirmed   Last Refilled: 06/20/2010  Method Requested: Electronic Initial call taken by: Rebeca Alert CMA Deborra Medina),  July 29, 2010 8:37 AM    Prescriptions: PRANDIN 2 MG  TABS (REPAGLINIDE) take 1 three times a day qd  #270 x 2   Entered by:   Rebeca Alert CMA ([place])   Authorized by:   Donavan Foil MD   Signed by:   Rebeca Alert CMA ([place]) on 07/29/2010   Method used:   Electronically to        [facility] (retail)       [address] [place], [place]  [id number]       Ph: [phone]       Fax: [phone]   RxID:   [id number]

## 2010-12-22 ENCOUNTER — Other Ambulatory Visit (INDEPENDENT_AMBULATORY_CARE_PROVIDER_SITE_OTHER): Payer: Medicare Other

## 2010-12-22 ENCOUNTER — Ambulatory Visit (INDEPENDENT_AMBULATORY_CARE_PROVIDER_SITE_OTHER): Payer: Medicare Other | Admitting: Endocrinology

## 2010-12-22 ENCOUNTER — Other Ambulatory Visit (INDEPENDENT_AMBULATORY_CARE_PROVIDER_SITE_OTHER): Payer: Medicare Other | Admitting: Endocrinology

## 2010-12-22 ENCOUNTER — Encounter: Payer: Self-pay | Admitting: Endocrinology

## 2010-12-22 DIAGNOSIS — E119 Type 2 diabetes mellitus without complications: Secondary | ICD-10-CM

## 2010-12-22 DIAGNOSIS — I10 Essential (primary) hypertension: Secondary | ICD-10-CM

## 2010-12-22 DIAGNOSIS — N289 Disorder of kidney and ureter, unspecified: Secondary | ICD-10-CM

## 2010-12-22 LAB — BASIC METABOLIC PANEL
GFR: 41.98 mL/min — ABNORMAL LOW (ref >60.00)
Potassium: 4.9 mEq/L (ref 3.5–5.1)

## 2010-12-22 LAB — HEMOGLOBIN A1C: Hgb A1c MFr Bld: 8.2 % — ABNORMAL HIGH (ref 4.6–6.5)

## 2010-12-22 MED ORDER — BROMOCRIPTINE MESYLATE 2.5 MG PO TABS: 2.5000 mg | ORAL_TABLET | Freq: Every day | ORAL | Status: DC

## 2010-12-22 MED ORDER — LOSARTAN POTASSIUM-HCTZ 100-25 MG PO TABS: 1.0000 | ORAL_TABLET | Freq: Every day | ORAL | Status: DC

## 2010-12-22 MED ORDER — ALPRAZOLAM 0.25 MG PO TABS: 0.2500 mg | ORAL_TABLET | Freq: Three times a day (TID) | ORAL | Status: DC | PRN

## 2010-12-22 NOTE — Patient Instructions (Addendum)
blood tests are being ordered for you today.  please call [phone] to hear your test results. pending the test results, please change micardis-hct to hyzaar 1 tab daily.  Please schedule a physical in 6 months. Here is a refill of your alprazolam.

## 2010-12-22 NOTE — Progress Notes (Signed)
  Subjective:    Patient ID: Marie Rhodes, female    DOB: Jul 16, 1936, 75 y.o.   MRN: [id number]  HPI The state of at least three ongoing medical problems is addressed today: Dm:  She is unaware of edema Htn:  She says her med is too expensive.  Denies chest pain. Renal insufficiency.  Denies sob Past Medical History  Diagnosis Date  . Multinodular goiter   . Fatty liver   . DIABETES MELLITUS, TYPE II 04/27/2007  . CROHN'S DISEASE, SMALL INTESTINE 06/08/2008    ileum, cecum  . GOITER, MULTINODULAR 05/04/2008  . ANXIETY 04/27/2007  . UNSPECIFIED ANEMIA 05/04/2008    Mild Anemia  . HYPERTENSION 04/27/2007  . OBESITY 08/01/2010  . DEPRESSION 04/27/2007   Past Surgical History  Procedure Date  . Appendectomy   . Meckel diverticulum excision     Ileal and cecal resection   . Left oophorectomy 1992  . Biopsy thyroid     reports that she has quit smoking. She does not have any smokeless tobacco history on file. She reports that she does not drink alcohol or use illicit drugs. family history includes Cancer in her father and other; Cancer (age of onset:78) in her mother; Diabetes in her other; and Kidney disease in her mother. Allergies not on file  Review of Systems She reports weight gain.  No numbness.   Objective:   Physical Exam GENERAL: no distress Pulses: dorsalis pedis intact bilat.   Extremities: no deformity.  no ulcer on the feet.  feet are of normal color and temp.  There is trace bilat edema. Neuro: sensation is intact to touch on the feet         Assessment & Plan:  Real insuff, uncertain etiology Dm.  She may need addition of prlodel Htn:  She wants a cheaper med.

## 2010-12-22 NOTE — Progress Notes (Signed)
Addended by: Renato Shin on: 12/22/2010 06:43 PM   Modules accepted: Orders

## 2011-01-09 ENCOUNTER — Other Ambulatory Visit: Payer: Self-pay | Admitting: Endocrinology

## 2011-01-12 NOTE — Telephone Encounter (Signed)
Please advise 

## 2011-01-12 NOTE — Telephone Encounter (Signed)
Please add to med list and refill x 1, thank you

## 2011-01-13 ENCOUNTER — Encounter: Payer: Self-pay | Admitting: Endocrinology

## 2011-01-13 NOTE — Telephone Encounter (Signed)
A user error has taken place: encounter opened in error, closed for administrative reasons.

## 2011-02-10 NOTE — Assessment & Plan Note (Signed)
[facility] OFFICE NOTE   Rhodes, ZILBERMAN                      MRN:          [id number]  DATE:04/11/2007                            DOB:          04/07/1936    CHIEF COMPLAINT:  Followup of prior anemia and Crohn's disease, previous  patient of Dr. Franki Cabot.   PROBLEMS:  1. Crohn's disease, status post ileal resection and cecal resection      1992 with little, if any, symptoms since then.  Maintained on      Pentasa 50 mg b.i.d.  2. Meckel's diverticulum resected at the same time.  3. Obesity.  4. Diabetes mellitus type 2 followed by Dr. Loanne Drilling.  5. Fatty liver.  6. Multi-nodular goiter.  7. Cough due to Zestril.  8. Hypertension.  9. Prior appendectomy 1991.  10.Anxiety/depression, she is a widow.  11.History of mild anemia, normocytic indices.  Hemoglobin was 11.9 on      Feb 04, 2007; 12.1 on March 09, 2007.   She says her stools have turned coal colored on iron in the  multivitamin.  She is frustrated about her weight issues, though she  wants to try to lose and she had done well on Weight Watchers for a week  but did not stick with it but says she is going to go back to that.  Iron saturation was 19% with transferrin 377 which was slightly higher  on iron with 101 but her ferritin was 106.  B-12 was 292, folate greater  than 20.  She has started on a multivitamin Centrum Silver, as well as  vitamin B-12 p.o. 500 mcg daily.  She wonders if that is enough as she  was not given specific instructions on that.  Other medications are  listed and reviewed in the chart.  Actos has caused her to gain weight  she thinks and Dr. Loanne Drilling has told her that is good for her fatty liver  probably and that the next step would insulin and that would make her  gain weight as well.   THERE ARE NO KNOWN DRUG ALLERGIES.   PHYSICAL EXAMINATION:  Shows a weight of 216 pounds, pulse 100, blood  pressure  138/84.  ABDOMEN:  Soft, nontender, well-healed surgical scars, no  hepatosplenomegaly or mass.  No stigmata of cirrhosis on the skin.   ASSESSMENT:  1. History of Crohn's ileitis.  She has done very well with low dose      Pentasa since that time.  2. Low normal B-12 level.  3. There is fatigue, weight gain, obesity, diabetes, probably      multifactorial as far the fatigue.  She certainly has some      depressive symptomatology.  4. Family history of colon cancer in a second degree relative (uncle).      Her last colonoscopy was in 2007 and was fine into the neo-terminal      ileum.   PLAN:  1. I am going to give her a one time injection of vitamin B-12 at 1000      mcg and she will continue p.o.  1000 mcg daily.  We will need to      monitor her B-12 level at least yearly.  2. Continue her Pentasa 500 mg b.i.d., she has been on that for 10      years.  3. Continue ongoing medical care with Dr. Loanne Drilling.  4. Regarding this anemia, I would not be surprised if there was not an      element of some chronic disease anemia though it is trivial.  5. We discussed weight loss and portion control and caloric intake as      the main way to lose weight, though exercise is encouraged as it is      beneficial to her mood and losing weight.   I will see her back as needed at this point.     Gatha Mayer, MD,FACG  Electronically Signed    CEG/MedQ  DD: 04/11/2007  DT: 04/11/2007  Job #: [id number]   cc:   Hilliard Clark A. Loanne Drilling, MD

## 2011-02-10 NOTE — Assessment & Plan Note (Signed)
[facility] OFFICE NOTE   TASHONNA, DESCOTEAUX                      MRN:          [id number]  DATE:03/09/2007                            DOB:          [birth year]    Marie Rhodes comes in and says she has been a little bit anemic and been on  some Aciphex and wondered about her hemoglobin dropping with a normal  MCV.  We assessed that with her.  She has had resection of terminal  ileum.  I told her it could be some B12 deficiency and may be folate  because she has been on Pentasa which she takes 250 mg, 2 t.i.d. along  with Aciphex one a day which she has for quite some time.  She takes  Centrum Silver and Glucophage and Actos and Micardis.   ON PHYSICAL EXAMINATION:  GENERAL:  She looks good.  VITAL SIGNS:  She weighs 215.  Blood pressure 110/60.  Pulse 120.  OROPHARYNX:  Negative.  NECK:  Negative.  CHEST:  Clear.  HEART:  Revealed a regular rhythm.  ABDOMEN:  Soft.  No masses organomegaly.  Nontender.   IMPRESSION:  1. Status post Crohn's disease and __________  diverticulum with      resection of the terminal ileum as described.  2. Gastroesophageal reflux disease.  3. Mild obesity.  4. History of steatosis.  5. Mild anxiety and depression.  6. Diabetes.  7. Anemia of questionable type.   RECOMMENDATIONS:  My recommendation is that she have a CBC and anemia  profile, check on her B12, folate, and iron studies.  She was told  to  take some iron, B12 and folate in the meantime and I am going to have  her followup with Dr. Carlean Purl in the next few weeks because of this.  I  am concerned that she might have a mixed picture here and therefore the  iron deficiency and __________  B12 because of her resection of the  terminal ileum.  I am sure Dr. Carlean Purl can get this all under control.     Clarene Reamer, MD  Electronically Signed    SML/MedQ  DD: 03/09/2007  DT: 03/09/2007  Job #: [id number]

## 2011-03-04 ENCOUNTER — Other Ambulatory Visit: Payer: Self-pay | Admitting: Endocrinology

## 2011-03-05 NOTE — Telephone Encounter (Signed)
Please advise on pt's refill of Alprazolam

## 2011-03-05 NOTE — Telephone Encounter (Signed)
Rx faxed to [facility].

## 2011-05-06 ENCOUNTER — Other Ambulatory Visit: Payer: Self-pay | Admitting: *Deleted

## 2011-05-06 MED ORDER — METFORMIN HCL ER 500 MG PO TB24: ORAL_TABLET | ORAL | Status: DC

## 2011-05-06 MED ORDER — REPAGLINIDE 2 MG PO TABS: 2.0000 mg | ORAL_TABLET | Freq: Three times a day (TID) | ORAL | Status: DC

## 2011-05-06 NOTE — Telephone Encounter (Signed)
R'cd fax from [facility] for refill of Prandin and Metformin  Last OV-12/22/2010

## 2011-06-06 ENCOUNTER — Other Ambulatory Visit: Payer: Self-pay | Admitting: Endocrinology

## 2011-07-08 ENCOUNTER — Other Ambulatory Visit: Payer: Self-pay | Admitting: Endocrinology

## 2011-09-08 ENCOUNTER — Other Ambulatory Visit: Payer: Self-pay | Admitting: Internal Medicine

## 2011-09-08 ENCOUNTER — Other Ambulatory Visit: Payer: Self-pay | Admitting: Endocrinology

## 2011-09-10 ENCOUNTER — Other Ambulatory Visit (INDEPENDENT_AMBULATORY_CARE_PROVIDER_SITE_OTHER): Payer: Medicare Other

## 2011-09-10 ENCOUNTER — Encounter: Payer: Self-pay | Admitting: Endocrinology

## 2011-09-10 ENCOUNTER — Ambulatory Visit (INDEPENDENT_AMBULATORY_CARE_PROVIDER_SITE_OTHER): Payer: Medicare Other | Admitting: Endocrinology

## 2011-09-10 ENCOUNTER — Ambulatory Visit (INDEPENDENT_AMBULATORY_CARE_PROVIDER_SITE_OTHER)
Admission: RE | Admit: 2011-09-10 | Discharge: 2011-09-10 | Disposition: A | Discharge status: Home / Self Care | Payer: Medicare Other | Source: Ambulatory Visit | Attending: Endocrinology | Admitting: Endocrinology

## 2011-09-10 VITALS — BP 114/64 | HR 92 | Temp 98.3°F | Ht 67.0 in | Wt 227.4 lb

## 2011-09-10 DIAGNOSIS — Z780 Asymptomatic menopausal state: Secondary | ICD-10-CM

## 2011-09-10 DIAGNOSIS — E119 Type 2 diabetes mellitus without complications: Secondary | ICD-10-CM

## 2011-09-10 DIAGNOSIS — E7800 Pure hypercholesterolemia, unspecified: Secondary | ICD-10-CM

## 2011-09-10 DIAGNOSIS — I10 Essential (primary) hypertension: Secondary | ICD-10-CM

## 2011-09-10 DIAGNOSIS — N289 Disorder of kidney and ureter, unspecified: Secondary | ICD-10-CM

## 2011-09-10 DIAGNOSIS — E042 Nontoxic multinodular goiter: Secondary | ICD-10-CM

## 2011-09-10 DIAGNOSIS — Z0000 Encounter for general adult medical examination without abnormal findings: Secondary | ICD-10-CM

## 2011-09-10 DIAGNOSIS — Z23 Encounter for immunization: Secondary | ICD-10-CM

## 2011-09-10 DIAGNOSIS — K7689 Other specified diseases of liver: Secondary | ICD-10-CM

## 2011-09-10 DIAGNOSIS — D649 Anemia, unspecified: Secondary | ICD-10-CM

## 2011-09-10 DIAGNOSIS — Z13820 Encounter for screening for osteoporosis: Secondary | ICD-10-CM

## 2011-09-10 LAB — URINALYSIS, ROUTINE W REFLEX MICROSCOPIC
Bilirubin Urine: NEGATIVE
Ketones, ur: NEGATIVE
Leukocytes, UA: NEGATIVE
Urobilinogen, UA: 0.2 (ref 0.0–1.0)

## 2011-09-10 LAB — BASIC METABOLIC PANEL
CO2: 28 mEq/L (ref 19–32)
Calcium: 10.2 mg/dL (ref 8.4–10.5)
GFR: 43.41 mL/min — ABNORMAL LOW (ref >60.00)
Sodium: 139 mEq/L (ref 135–145)

## 2011-09-10 LAB — MICROALBUMIN / CREATININE URINE RATIO
Creatinine,U: 160.8 mg/dL
Microalb Creat Ratio: 0.4 mg/g (ref 0.0–30.0)

## 2011-09-10 LAB — CBC WITH DIFFERENTIAL/PLATELET
Basophils Absolute: 0 10*3/uL (ref 0.0–0.1)
Eosinophils Absolute: 0.3 10*3/uL (ref 0.0–0.7)
HCT: 36 % (ref 36.0–46.0)
Lymphocytes Relative: 30.3 % (ref 12.0–46.0)
Lymphs Abs: 1.9 10*3/uL (ref 0.7–4.0)
Monocytes Relative: 6.4 % (ref 3.0–12.0)
Platelets: 258 10*3/uL (ref 150.0–400.0)
RDW: 13.2 % (ref 11.5–14.6)

## 2011-09-10 LAB — HEPATIC FUNCTION PANEL
AST: 26 U/L (ref 0–37)
Total Bilirubin: 0.5 mg/dL (ref 0.3–1.2)

## 2011-09-10 LAB — LIPID PANEL
HDL: 55.8 mg/dL (ref >39.00)
LDL Cholesterol: 51 mg/dL (ref 0–99)
Total CHOL/HDL Ratio: 2
Triglycerides: 126 mg/dL (ref 0.0–149.0)

## 2011-09-10 LAB — IBC PANEL
Iron: 62 ug/dL (ref 42–145)
Saturation Ratios: 11.3 % — ABNORMAL LOW (ref 20.0–50.0)

## 2011-09-10 LAB — TSH: TSH: 1.46 u[IU]/mL (ref 0.35–5.50)

## 2011-09-10 NOTE — Patient Instructions (Addendum)
blood tests are being requested for you today.  please call [phone] to hear your test results.  You will be prompted to enter the 9-digit "MRN" number that appears at the top left of this page, followed by #.  Then you will hear the message. please consider these measures for your health:  minimize alcohol.  do not use tobacco products.  have a colonoscopy at least every 10 years from age 73.  Women should have an annual mammogram from age 62.  keep firearms safely stored.  always use seat belts.  have working smoke alarms in your home.  see an eye doctor and dentist regularly.  never drive under the influence of alcohol or drugs (including prescription drugs).  those with fair skin should take precautions against the sun. please let me know what your wishes would be, if artificial life support measures should become necessary.  it is critically important to prevent falling down (keep floor areas well-lit, dry, and free of loose objects.  If you have a cane, walker, or wheelchair, you should use it, even for short trips around the house.  Also, try not to rush) most of the time, a "lumpy thyroid" will eventually become overactive.  this is usually a slow process, happening over the span of many years. good diet and exercise habits significanly improve the control of your diabetes.  please let me know if you wish to be referred to a dietician.  high blood sugar is very risky to your health.  you should see an eye doctor every year. controlling your blood pressure and cholesterol drastically reduces the damage diabetes does to your body.  this also applies to quitting smoking.  please discuss these with your doctor.  you should take an aspirin every day, unless you have been advised by a doctor not to. Please come back for a follow-up appointment in 6 months. Check bone-density test.  Please make an appointment. please call if you want to get the shingles vaccine. (update: i left message on phone-tree:  rx  as we discussed)

## 2011-09-10 NOTE — Progress Notes (Signed)
Subjective:    Patient ID: Marie Rhodes, female    DOB: 1935-11-29, 75 y.o.   MRN: [id number]  HPI The state of at least three ongoing medical problems is addressed today: DM: she is not taking parlodel. HTN: she denies sob.  she has lost weight, due to her efforts. Multinodular goiter: she does not notice the goiter Dyslipidemia:  Denies chest pain Past Medical History  Diagnosis Date  . Multinodular goiter   . Fatty liver   . DIABETES MELLITUS, TYPE II 04/27/2007  . CROHN'S DISEASE, SMALL INTESTINE 06/08/2008    ileum, cecum  . GOITER, MULTINODULAR 05/04/2008  . ANXIETY 04/27/2007  . UNSPECIFIED ANEMIA 05/04/2008    Mild Anemia  . HYPERTENSION 04/27/2007  . OBESITY 08/01/2010  . DEPRESSION 04/27/2007    Past Surgical History  Procedure Date  . Appendectomy   . Meckel diverticulum excision     Ileal and cecal resection   . Left oophorectomy 1992  . Biopsy thyroid     History   Social History  . Marital Status: Widowed    Spouse Name: N/A    Number of Children: N/A  . Years of Education: N/A   Occupational History  .      retired [employer]chant [employer] officer)   Social History Main Topics  . Smoking status: Former Research scientist (life sciences)  . Smokeless tobacco: Not on file  . Alcohol Use: No  . Drug Use: No  . Sexually Active:    Other Topics Concern  . Not on file   Social History Narrative   Pt does not get regular exerciseFormer Smoker (college only)    Current Outpatient Prescriptions on File Prior to Visit  Medication Sig Dispense Refill  . ACTOS 45 MG tablet TAKE 1 TABLET EACH DAY.  30 each  5  . Cyanocobalamin (VITAMIN B-12) 2500 MCG SUBL Place under the tongue. 1 by mouth in the morning       . folic acid (FOLVITE) 631 MCG tablet Take 400 mcg by mouth daily.        Marland Kitchen GLUCOPHAGE XR 500 MG 24 hr tablet TAKE (2) TABLETS TWICE DAILY.  120 each  3  . glucose blood (ONE TOUCH TEST STRIPS) test strip Check blood sugar once daily dx 250.00       . Hydrocortisone Micronized POWD RINSE AND SWALLOW  1      TEASPOONFUL EVERY 4 HOURSAS NEEDED.  1 Bottle  1  . losartan-hydrochlorothiazide (HYZAAR) 100-25 MG per tablet Take 1 tablet by mouth daily.  30 tablet  11  . metoprolol tartrate (LOPRESSOR) 25 MG tablet TAKE (1/2) TABLET TWICE DAILY.  30 tablet  5  . Multiple Vitamin (MULTIVITAMIN) tablet Take 1 tablet by mouth daily.        Marland Kitchen omeprazole (PRILOSEC OTC) 20 MG tablet 1 each day 30 minutes before meal       . PENTASA 500 MG CR capsule TAKE (1) CAPSULE TWICE DAILY.  60 each  0  . PRANDIN 2 MG tablet TAKE 1 TABLET THREE TIMES DAILY BEFORE MEALS.  90 each  3  . PRAVACHOL 40 MG tablet TAKE (1) TABLET DAILY AT BEDTIME.  30 each  5  . bromocriptine (PARLODEL) 2.5 MG tablet Take 1 tablet (2.5 mg total) by mouth at bedtime.  30 tablet  11  . XANAX 0.25 MG tablet TAKE AS NEEDED FOR       ANXIETY.  30 each  2    No Known Allergies  Family History  Problem Relation  Age of Onset  . Cancer Mother 73    Breast Cancer  . Kidney disease Mother   . Cancer Father     skin cancer  . Cancer Other     colon cancer (uncle)  . Diabetes Other     BP 114/64  Pulse 92  Temp(Src) 98.3 F (36.8 C) (Oral)  Ht 5' 7"  (1.702 m)  Wt 227 lb 6.4 oz (103.148 kg)  BMI 35.62 kg/m2  SpO2 95%   Review of Systems  Gastrointestinal: Negative for anal bleeding.  Genitourinary: Negative for hematuria.       Objective:   Physical Exam VS: see vs page GEN: no distress HEAD: head: no deformity eyes: no periorbital swelling, no proptosis external nose and ears are normal NECK: small multinodular goiter, left >> right CHEST WALL: no deformity LUNGS:  Clear to auscultation BREASTS:  sees gyn CV: reg rate and rhythm, no murmur GENITALIA/RECTAL: sees gyn MUSCULOSKELETAL: muscle bulk and strength are grossly normal.  no obvious joint swelling.  gait is normal and steady EXTEMITIES: no deformity.  no ulcer on the feet.  feet are of normal color and temp.  no edema PULSES: dorsalis pedis intact bilat.  no  carotid bruit NEURO:  cn 2-12 grossly intact.   readily moves all 4's.  sensation is intact to touch on the feet SKIN:  Normal texture and temperature.  No rash or suspicious lesion is visible.   NODES:  None palpable at the neck PSYCH: alert, oriented x3.  Does not appear anxious nor depressed.    Lab Results  Component Value Date   WBC 6.1 09/10/2011   HGB 12.2 09/10/2011   HCT 36.0 09/10/2011   PLT 258.0 09/10/2011   GLUCOSE 109* 09/10/2011   CHOL 132 09/10/2011   TRIG 126.0 09/10/2011   HDL 55.80 09/10/2011   LDLDIRECT 71.6 06/24/2010   LDLCALC 51 09/10/2011   ALT 27 09/10/2011   AST 26 09/10/2011   NA 139 09/10/2011   K 4.4 09/10/2011   CL 102 09/10/2011   CREATININE 1.3* 09/10/2011   BUN 33* 09/10/2011   CO2 28 09/10/2011   TSH 1.46 09/10/2011   HGBA1C 6.8* 09/10/2011   MICROALBUR 0.6 09/10/2011      Assessment & Plan:  DM, well-controlled HTN, well-controlled Goiter, unchanged Dyslipidemia, well-controlled   Subjective:   Patient here for Medicare annual wellness visit and management of other chronic and acute problems.     Risk factors: advanced age    26 of Physicians Providing Medical Care to Patient: GI: gessner Gyn: henley Opthal: jacklin   Activities of Daily Living: In your present state of health, do you have any difficulty performing the following activities?:  Preparing food and eating?: No  Bathing yourself: No  Getting dressed: No  Using the toilet:No  Moving around from place to place: No  In the past year have you fallen or had a near fall?: No    Home Safety: Has smoke detector and wears seat belts. No firearms. No excess sun exposure.  Diet and Exercise  Current exercise habits: pt says "better" Dietary issues discussed: pt reports a healthy diet   Depression Screen  Q1: Over the past two weeks, have you felt down, depressed or hopeless? no  Q2: Over the past two weeks, have you felt little interest or pleasure in doing  things? no   The following portions of the patient's history were reviewed and updated as appropriate: allergies, current medications, past family history, past medical  history, past social history, past surgical history and problem list.  Past Medical History  Diagnosis Date  . Multinodular goiter   . Fatty liver   . DIABETES MELLITUS, TYPE II 04/27/2007  . CROHN'S DISEASE, SMALL INTESTINE 06/08/2008    ileum, cecum  . GOITER, MULTINODULAR 05/04/2008  . ANXIETY 04/27/2007  . UNSPECIFIED ANEMIA 05/04/2008    Mild Anemia  . HYPERTENSION 04/27/2007  . OBESITY 08/01/2010  . DEPRESSION 04/27/2007    Past Surgical History  Procedure Date  . Appendectomy   . Meckel diverticulum excision     Ileal and cecal resection   . Left oophorectomy 1992  . Biopsy thyroid     History   Social History  . Marital Status: Widowed    Spouse Name: N/A    Number of Children: N/A  . Years of Education: N/A   Occupational History  .      retired Merchant [employer] officer)   Social History Main Topics  . Smoking status: Former Research scientist (life sciences)  . Smokeless tobacco: Not on file  . Alcohol Use: No  . Drug Use: No  . Sexually Active:    Other Topics Concern  . Not on file   Social History Narrative   Pt does not get regular exerciseFormer Smoker (college only)    Current Outpatient Prescriptions on File Prior to Visit  Medication Sig Dispense Refill  . ACTOS 45 MG tablet TAKE 1 TABLET EACH DAY.  30 each  5  . Cyanocobalamin (VITAMIN B-12) 2500 MCG SUBL Place under the tongue. 1 by mouth in the morning       . folic acid (FOLVITE) 716 MCG tablet Take 400 mcg by mouth daily.        Marland Kitchen GLUCOPHAGE XR 500 MG 24 hr tablet TAKE (2) TABLETS TWICE DAILY.  120 each  3  . glucose blood (ONE TOUCH TEST STRIPS) test strip Check blood sugar once daily dx 250.00       . Hydrocortisone Micronized POWD RINSE AND SWALLOW 1      TEASPOONFUL EVERY 4 HOURSAS NEEDED.  1 Bottle  1  . losartan-hydrochlorothiazide (HYZAAR) 100-25 MG per tablet  Take 1 tablet by mouth daily.  30 tablet  11  . metoprolol tartrate (LOPRESSOR) 25 MG tablet TAKE (1/2) TABLET TWICE DAILY.  30 tablet  5  . Multiple Vitamin (MULTIVITAMIN) tablet Take 1 tablet by mouth daily.        Marland Kitchen omeprazole (PRILOSEC OTC) 20 MG tablet 1 each day 30 minutes before meal       . PENTASA 500 MG CR capsule TAKE (1) CAPSULE TWICE DAILY.  60 each  0  . PRANDIN 2 MG tablet TAKE 1 TABLET THREE TIMES DAILY BEFORE MEALS.  90 each  3  . PRAVACHOL 40 MG tablet TAKE (1) TABLET DAILY AT BEDTIME.  30 each  5  . bromocriptine (PARLODEL) 2.5 MG tablet Take 1 tablet (2.5 mg total) by mouth at bedtime.  30 tablet  11  . XANAX 0.25 MG tablet TAKE AS NEEDED FOR       ANXIETY.  30 each  2    No Known Allergies  Family History  Problem Relation Age of Onset  . Cancer Mother 8    Breast Cancer  . Kidney disease Mother   . Cancer Father     skin cancer  . Cancer Other     colon cancer (uncle)  . Diabetes Other     BP 114/64  Pulse 92  Temp(Src) 98.3  F (36.8 C) (Oral)  Ht 5' 7"  (1.702 m)  Wt 227 lb 6.4 oz (103.148 kg)  BMI 35.62 kg/m2  SpO2 95%   Review of Systems  Denies hearing loss, and visual loss Objective:   Vision:  Sees opthalmologist Hearing: grossly normal Body mass index:  See vs page Msk: pt easily and quickly performs "get-up-and-go" from a sitting position Cognitive Impairment Assessment: cognition, memory and judgment appear normal.  remembers 3/3 at 5 minutes.  excellent recall.  can easily read and write a sentence.  alert and oriented x 3   Assessment:   Medicare wellness utd on preventive parameters    Plan:   During the course of the visit the patient was educated and counseled about appropriate screening and preventive services including:        Fall prevention   Screening mammography (gyn does this) Bone densitometry screening--ordered Diabetes screening--today's labs  Nutrition counseling   Vaccines / LABS  Patient Instructions (the  written plan) was given to the patient.

## 2011-10-01 ENCOUNTER — Ambulatory Visit (INDEPENDENT_AMBULATORY_CARE_PROVIDER_SITE_OTHER): Payer: Medicare Other | Admitting: Internal Medicine

## 2011-10-01 ENCOUNTER — Encounter: Payer: Self-pay | Admitting: Internal Medicine

## 2011-10-01 DIAGNOSIS — E669 Obesity, unspecified: Secondary | ICD-10-CM

## 2011-10-01 DIAGNOSIS — K219 Gastro-esophageal reflux disease without esophagitis: Secondary | ICD-10-CM

## 2011-10-01 DIAGNOSIS — K5000 Crohn's disease of small intestine without complications: Secondary | ICD-10-CM

## 2011-10-01 MED ORDER — MESALAMINE ER 500 MG PO CPCR: 500.0000 mg | ORAL_CAPSULE | Freq: Two times a day (BID) | ORAL | Status: DC

## 2011-10-01 MED ORDER — OMEPRAZOLE MAGNESIUM 20 MG PO TBEC: DELAYED_RELEASE_TABLET | ORAL | Status: DC

## 2011-10-01 NOTE — Progress Notes (Signed)
Subjective:    Patient ID: Marie Rhodes, female    DOB: Aug 02, 1936, 76 y.o.   MRN: [id number]  HPI Crohn's disease of the small bowel is asymptomatic. She takes low-dose Pentasa and is not having any problems. She had a resection of the ileum and cecum years ago.  GERD is under control with no heartburn or indigestion on omeprazole. She is considering trying to stop the medication.  Obesity is improved. She has been walking and using Weight Watchers. Wt Readings from Last 3 Encounters:  10/01/11 230 lb (104.327 kg)  09/10/11 227 lb 6.4 oz (103.148 kg)  12/22/10 241 lb 12.8 oz (109.68 kg)   No Known Allergies Outpatient Prescriptions Prior to Visit  Medication Sig Dispense Refill  . ACTOS 45 MG tablet TAKE 1 TABLET EACH DAY.  30 each  5  . Cyanocobalamin (VITAMIN B-12) 2500 MCG SUBL Place under the tongue. 1 by mouth in the morning       . folic acid (FOLVITE) 557 MCG tablet Take 400 mcg by mouth daily.        Marland Kitchen GLUCOPHAGE XR 500 MG 24 hr tablet TAKE (2) TABLETS TWICE DAILY.  120 each  3  . glucose blood (ONE TOUCH TEST STRIPS) test strip Check blood sugar once daily dx 250.00       . Hydrocortisone Micronized POWD RINSE AND SWALLOW 1      TEASPOONFUL EVERY 4 HOURSAS NEEDED.  1 Bottle  1  . losartan-hydrochlorothiazide (HYZAAR) 100-25 MG per tablet Take 1 tablet by mouth daily.  30 tablet  11  . metoprolol tartrate (LOPRESSOR) 25 MG tablet TAKE (1/2) TABLET TWICE DAILY.  30 tablet  5  . Multiple Vitamin (MULTIVITAMIN) tablet Take 1 tablet by mouth daily.        Marland Kitchen PRANDIN 2 MG tablet TAKE 1 TABLET THREE TIMES DAILY BEFORE MEALS.  90 each  3  . PRAVACHOL 40 MG tablet TAKE (1) TABLET DAILY AT BEDTIME.  30 each  5  . XANAX 0.25 MG tablet TAKE AS NEEDED FOR       ANXIETY.  30 each  2  . omeprazole (PRILOSEC OTC) 20 MG tablet 1 each day 30 minutes before meal       . PENTASA 500 MG CR capsule TAKE (1) CAPSULE TWICE DAILY.  60 each  0  . bromocriptine (PARLODEL) 2.5 MG tablet Take 1 tablet  (2.5 mg total) by mouth at bedtime.  30 tablet  11   Past Medical History  Diagnosis Date  . Fatty liver   . DIABETES MELLITUS, TYPE II 04/27/2007  . CROHN'S DISEASE, SMALL INTESTINE 06/08/2008    ileum, cecum  . GOITER, MULTINODULAR 05/04/2008  . ANXIETY 04/27/2007  . UNSPECIFIED ANEMIA 05/04/2008    Mild Anemia  . HYPERTENSION 04/27/2007  . OBESITY 08/01/2010  . DEPRESSION 04/27/2007  . Hyperlipemia   . GERD (gastroesophageal reflux disease)    Past Surgical History  Procedure Date  . Appendectomy   . Meckel diverticulum excision     Ileal and cecal resection   . Left oophorectomy 1992  . Biopsy thyroid   . Colonoscopy 08/10/2006    normal   History   Social History  . Marital Status: Widowed            .     Occupational History  . 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 Smoking status: Former Research scientist (life sciences)  . Smokeless tobacco: Never Used  .  Alcohol Use: No  . Drug Use: No    Social History Narrative   Walking at gym fairly regularlyFormer Smoker (college only)   Family History  Problem Relation Age of Onset  . Breast cancer Mother 30  . Kidney disease Mother   . Skin cancer Father   . Colon cancer Other     uncle  . Diabetes Other          Review of Systems Diabetes is under better control with hemoglobin A1c 6.8% last month.    Objective:   Physical Exam General:  NAD obese Eyes:   anicteric Lungs:  clear Heart:  S1S2 no rubs, murmurs or gallops Abdomen:  soft and nontender, BS+     Data Reviewed: December laboratory studies, including CBC, hemoglobin A1c, chemistries and lipids         Assessment & Plan:

## 2011-10-01 NOTE — Assessment & Plan Note (Signed)
Stable - no symptoms. Continue low-dose Pentasa

## 2011-10-01 NOTE — Patient Instructions (Addendum)
If you decide to stop the Omeprazole try this by taking the medication every other day over a few weeks as discussed then stop. Your prescription(s) has(have) been sent to your pharmacy for you to pick up. Return to see Dr. Carlean Purl in 1 year.

## 2011-10-01 NOTE — Assessment & Plan Note (Signed)
Improved - doing weight watchers  Wt Readings from Last 3 Encounters:  10/01/11 230 lb (104.327 kg)  09/10/11 227 lb 6.4 oz (103.148 kg)  12/22/10 241 lb 12.8 oz (109.68 kg)

## 2011-10-01 NOTE — Assessment & Plan Note (Addendum)
No problems - continue current therapy - she is considering stopping the omeprazole as has been on for years (started after Crohn's surgery) and cannot remember exactly why it was started. She is losing weight (Weight Watchers) and will continue to do that and consider trying to wean the omeprazole to off at some point.

## 2011-10-13 ENCOUNTER — Encounter: Payer: Self-pay | Admitting: Endocrinology

## 2011-12-13 ENCOUNTER — Other Ambulatory Visit: Payer: Self-pay | Admitting: Endocrinology

## 2012-01-12 ENCOUNTER — Other Ambulatory Visit: Payer: Self-pay | Admitting: Endocrinology

## 2012-01-13 ENCOUNTER — Other Ambulatory Visit: Payer: Self-pay | Admitting: *Deleted

## 2012-01-13 DIAGNOSIS — I10 Essential (primary) hypertension: Secondary | ICD-10-CM

## 2012-01-13 MED ORDER — LOSARTAN POTASSIUM-HCTZ 100-25 MG PO TABS: 1.0000 | ORAL_TABLET | Freq: Every day | ORAL | Status: DC

## 2012-01-13 MED ORDER — METFORMIN HCL ER 500 MG PO TB24: ORAL_TABLET | ORAL | Status: DC

## 2012-01-13 NOTE — Telephone Encounter (Signed)
R'cd fax from [facility] for refill of Losartan-HCTZ and Metformin

## 2012-02-12 ENCOUNTER — Other Ambulatory Visit: Payer: Self-pay | Admitting: *Deleted

## 2012-02-12 MED ORDER — REPAGLINIDE 2 MG PO TABS: ORAL_TABLET | ORAL | Status: DC

## 2012-02-12 NOTE — Telephone Encounter (Signed)
R'cd fax from [facility] for refill of Prandin.

## 2012-02-26 ENCOUNTER — Other Ambulatory Visit: Payer: Self-pay | Admitting: Dermatology

## 2012-03-10 ENCOUNTER — Encounter: Payer: Self-pay | Admitting: Endocrinology

## 2012-03-10 ENCOUNTER — Other Ambulatory Visit (INDEPENDENT_AMBULATORY_CARE_PROVIDER_SITE_OTHER): Payer: Medicare Other

## 2012-03-10 ENCOUNTER — Ambulatory Visit (INDEPENDENT_AMBULATORY_CARE_PROVIDER_SITE_OTHER): Payer: Medicare Other | Admitting: Endocrinology

## 2012-03-10 VITALS — BP 114/78 | HR 95 | Temp 98.5°F | Ht 67.0 in | Wt 245.0 lb

## 2012-03-10 DIAGNOSIS — E119 Type 2 diabetes mellitus without complications: Secondary | ICD-10-CM

## 2012-03-10 LAB — HEMOGLOBIN A1C: Hgb A1c MFr Bld: 6.3 % (ref 4.6–6.5)

## 2012-03-10 NOTE — Progress Notes (Signed)
Subjective:    Patient ID: Marie Rhodes, female    DOB: 1935/12/28, 76 y.o.   MRN: [id number]  HPI Pt returns for f/u of type 2 DM (6063; complicated by renal insufficiency).  pt states she feels well in general.  no cbg record, but states cbg's are well-controlled. Past Medical History  Diagnosis Date  . Fatty liver   . DIABETES MELLITUS, TYPE II 04/27/2007  . CROHN'S DISEASE, SMALL INTESTINE 06/08/2008    ileum, cecum  . GOITER, MULTINODULAR 05/04/2008  . ANXIETY 04/27/2007  . UNSPECIFIED ANEMIA 05/04/2008    Mild Anemia  . HYPERTENSION 04/27/2007  . OBESITY 08/01/2010  . DEPRESSION 04/27/2007  . Hyperlipemia   . GERD (gastroesophageal reflux disease)     Past Surgical History  Procedure Date  . Appendectomy   . Meckel diverticulum excision     Ileal and cecal resection   . Left oophorectomy 1992  . Biopsy thyroid   . Colonoscopy 08/10/2006    normal    History   Social History  . Marital Status: Widowed    Spouse Name: N/A    Number of Children: N/A  . Years of Education: N/A   Occupational History  . 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 Smoking status: Former Research scientist (life sciences)  . Smokeless tobacco: Never Used  . Alcohol Use: No  . Drug Use: No  . Sexually Active: Not on file   Other Topics Concern  . Not on file   Social History Narrative   Pt does get regular exercise - walking at gymFormer Smoker (college only)    Current Outpatient Prescriptions on File Prior to Visit  Medication Sig Dispense Refill  . ACTOS 45 MG tablet TAKE 1 TABLET EACH DAY.  30 each  5  . Cyanocobalamin (VITAMIN B-12) 2500 MCG SUBL Place under the tongue. 1 by mouth in the morning       . folic acid (FOLVITE) 016 MCG tablet Take 400 mcg by mouth daily.        Marland Kitchen glucose blood (ONE TOUCH TEST STRIPS) test strip Check blood sugar once daily dx 250.00       . Hydrocortisone Micronized POWD RINSE AND SWALLOW 1      TEASPOONFUL EVERY 4 HOURSAS NEEDED.  1 Bottle  1    . losartan-hydrochlorothiazide (HYZAAR) 100-25 MG per tablet Take 1 tablet by mouth daily.  30 tablet  7  . mesalamine (PENTASA) 500 MG CR capsule Take 1 capsule (500 mg total) by mouth 2 (two) times daily.  60 capsule  11  . metFORMIN (GLUCOPHAGE XR) 500 MG 24 hr tablet TAKE (2) TABLETS TWICE DAILY.  120 tablet  7  . metoprolol tartrate (LOPRESSOR) 25 MG tablet TAKE (1/2) TABLET TWICE DAILY.  30 tablet  5  . Multiple Vitamin (MULTIVITAMIN) tablet Take 1 tablet by mouth daily.        Marland Kitchen omeprazole (PRILOSEC OTC) 20 MG tablet take1 each day 30 minutes before a meal  30 tablet  11  . PRAVACHOL 40 MG tablet TAKE (1) TABLET DAILY AT BEDTIME.  30 each  5  . repaglinide (PRANDIN) 2 MG tablet TAKE 1 TABLET THREE TIMES DAILY BEFORE MEALS.  90 tablet  3  . XANAX 0.25 MG tablet TAKE AS NEEDED FOR       ANXIETY.  30 each  2    No Known Allergies  Family History  Problem Relation Age of Onset  .  Breast cancer Mother 72  . Kidney disease Mother   . Skin cancer Father   . Colon cancer Other     uncle  . Diabetes Other     BP 114/78  Pulse 95  Temp 98.5 F (36.9 C) (Oral)  Ht 5' 7"  (1.702 m)  Wt 245 lb (111.131 kg)  BMI 38.37 kg/m2  SpO2 95%  Review of Systems denies sob    Objective:   Physical Exam VITAL SIGNS:  See vs page GENERAL: no distress Pulses: dorsalis pedis intact bilat.   Feet: no deformity.  no ulcer on the feet.  feet are of normal color and temp.  no edema Neuro: sensation is intact to touch on the feet.     Lab Results  Component Value Date   HGBA1C 6.3 03/10/2012      Assessment & Plan:  DM.  well-controlled

## 2012-03-10 NOTE — Patient Instructions (Addendum)
Please come back for a "medicare wellness" appointment in 6 months. blood tests are being requested for you today.  You will receive a letter with results.

## 2012-03-11 ENCOUNTER — Telehealth: Payer: Self-pay | Admitting: *Deleted

## 2012-03-11 NOTE — Telephone Encounter (Signed)
Called pt to inform of lab results, pt informed (letter also mailed to pt). 

## 2012-05-16 ENCOUNTER — Other Ambulatory Visit: Payer: Self-pay | Admitting: *Deleted

## 2012-05-16 MED ORDER — REPAGLINIDE 2 MG PO TABS: ORAL_TABLET | ORAL | Status: DC

## 2012-05-16 NOTE — Telephone Encounter (Signed)
R'cd fax form [facility] for refill of Prandin.

## 2012-06-15 ENCOUNTER — Other Ambulatory Visit: Payer: Self-pay | Admitting: *Deleted

## 2012-06-15 MED ORDER — PRAVASTATIN SODIUM 40 MG PO TABS: ORAL_TABLET | ORAL | Status: DC

## 2012-06-15 MED ORDER — METOPROLOL TARTRATE 25 MG PO TABS: ORAL_TABLET | ORAL | Status: DC

## 2012-06-15 NOTE — Telephone Encounter (Signed)
R'cd fax from [facility] for refill of Metoprolol and Pravastatin.

## 2012-06-22 ENCOUNTER — Encounter: Payer: Self-pay | Admitting: Internal Medicine

## 2012-06-28 ENCOUNTER — Ambulatory Visit (INDEPENDENT_AMBULATORY_CARE_PROVIDER_SITE_OTHER): Payer: Medicare Other | Admitting: Family Medicine

## 2012-06-28 ENCOUNTER — Ambulatory Visit: Payer: Medicare Other

## 2012-06-28 VITALS — BP 115/70 | HR 94 | Temp 98.4°F | Resp 16 | Ht 67.0 in | Wt 246.0 lb

## 2012-06-28 DIAGNOSIS — J9801 Acute bronchospasm: Secondary | ICD-10-CM

## 2012-06-28 DIAGNOSIS — R05 Cough: Secondary | ICD-10-CM

## 2012-06-28 DIAGNOSIS — R059 Cough, unspecified: Secondary | ICD-10-CM

## 2012-06-28 MED ORDER — AZITHROMYCIN 250 MG PO TABS: ORAL_TABLET | ORAL | Status: DC

## 2012-06-28 MED ORDER — ALBUTEROL SULFATE HFA 108 (90 BASE) MCG/ACT IN AERS: 2.0000 | INHALATION_SPRAY | Freq: Four times a day (QID) | RESPIRATORY_TRACT | Status: DC | PRN

## 2012-06-28 MED ORDER — PREDNISONE 50 MG PO TABS: 50.0000 mg | ORAL_TABLET | Freq: Every day | ORAL | Status: DC

## 2012-06-28 NOTE — Patient Instructions (Signed)
Thank you for coming in today. Take prednisone 1 daily for 5 days.  If not getting better by Friday please take the zpack.  Use albuterol every 6 hours as needed.  Come back if not getting better.  Call or go to the emergency room if you get worse, have trouble breathing, have chest pains, or palpitations.

## 2012-06-28 NOTE — Progress Notes (Signed)
Marie Rhodes is a 76 y.o. female who presents to [facility] today for cough congestion wheezing for 8 days. No fever or chills. Subjective shortness of breath. No chest pains. Consistent with prior episodes of bronchitis. Bothersome enough that she needs attention tonight. Feels well otherwise. No medications tried. No history of COPD or asthma. Additionally patient notes productive cough to clear sputum.   PMH: Reviewed diabetes Crohn's disease no history of COPD or asthma. Light former smoker History  Substance Use Topics  . Smoking status: Former Research scientist (life sciences)  . Smokeless tobacco: Never Used  . Alcohol Use: No   ROS as above no chest pain palpitations Medications reviewed. Current Outpatient Prescriptions  Medication Sig Dispense Refill  . ACTOS 45 MG tablet TAKE 1 TABLET EACH DAY.  30 each  5  . Cyanocobalamin (VITAMIN B-12) 2500 MCG SUBL Place under the tongue. 1 by mouth in the morning       . folic acid (FOLVITE) 680 MCG tablet Take 400 mcg by mouth daily.        Marland Kitchen glucose blood (ONE TOUCH TEST STRIPS) test strip Check blood sugar once daily dx 250.00       . losartan-hydrochlorothiazide (HYZAAR) 100-25 MG per tablet Take 1 tablet by mouth daily.  30 tablet  7  . mesalamine (PENTASA) 500 MG CR capsule Take 1 capsule (500 mg total) by mouth 2 (two) times daily.  60 capsule  11  . metFORMIN (GLUCOPHAGE XR) 500 MG 24 hr tablet TAKE (2) TABLETS TWICE DAILY.  120 tablet  7  . metoprolol tartrate (LOPRESSOR) 25 MG tablet TAKE (1/2) TABLET TWICE DAILY.  30 tablet  5  . Multiple Vitamin (MULTIVITAMIN) tablet Take 1 tablet by mouth daily.        Marland Kitchen omeprazole (PRILOSEC OTC) 20 MG tablet take1 each day 30 minutes before a meal  30 tablet  11  . pravastatin (PRAVACHOL) 40 MG tablet TAKE (1) TABLET DAILY AT BEDTIME.  30 tablet  5  . repaglinide (PRANDIN) 2 MG tablet TAKE 1 TABLET THREE TIMES DAILY BEFORE MEALS.  90 tablet  5  . XANAX 0.25 MG tablet TAKE AS NEEDED FOR       ANXIETY.  30 each  2    Exam:   BP 115/70  Pulse 94  Temp 98.4 F (36.9 C)  Resp 16  Ht 5' 7"  (1.702 m)  Wt 246 lb (111.585 kg)  BMI 38.53 kg/m2  SpO2 95% Gen: Well NAD HEENT: EOMI,  MMM Lungs: Expiratory wheeze with prolonged expiratory phase bilaterally no crackles present. Normal work of breathing. Heart: RRR no MRG Abd: NABS, NT, ND Exts: Non edematous BL  LE, warm and well perfused.   2 view chest x-ray no infiltrates seen .  Haziness on left lower corner on AP but not on the lateral. No definitive acute changes   Patient received a albuterol and Atrovent breathing treatment. Following the treatment her lungs cleared however she still maintained a cough.  Assessment and Plan: 76 y.o. female with bronchitis versus COPD exacerbation. No prior history of asthma or COPD however was a smoker in the past. Plan to treat as though COPD with prednisone and albuterol. Azithromycin if not improving. Discussed warning signs or symptoms. Please see discharge instructions. Patient expresses understanding.

## 2012-06-29 ENCOUNTER — Other Ambulatory Visit: Payer: Self-pay | Admitting: General Practice

## 2012-06-29 MED ORDER — GLUCOSE BLOOD VI STRP: ORAL_STRIP | Status: DC

## 2012-07-11 ENCOUNTER — Other Ambulatory Visit: Payer: Self-pay | Admitting: General Practice

## 2012-07-11 MED ORDER — PIOGLITAZONE HCL 45 MG PO TABS: ORAL_TABLET | ORAL | Status: DC

## 2012-08-16 ENCOUNTER — Other Ambulatory Visit: Payer: Self-pay | Admitting: *Deleted

## 2012-08-16 MED ORDER — METFORMIN HCL ER 500 MG PO TB24: ORAL_TABLET | ORAL | Status: DC

## 2012-08-16 NOTE — Telephone Encounter (Signed)
Refill sent to [facility] for metfromin

## 2012-08-19 ENCOUNTER — Other Ambulatory Visit: Payer: Self-pay | Admitting: Dermatology

## 2012-09-12 ENCOUNTER — Ambulatory Visit (INDEPENDENT_AMBULATORY_CARE_PROVIDER_SITE_OTHER): Payer: Medicare Other | Admitting: Endocrinology

## 2012-09-12 ENCOUNTER — Encounter: Payer: Self-pay | Admitting: Endocrinology

## 2012-09-12 ENCOUNTER — Other Ambulatory Visit: Payer: Self-pay | Admitting: *Deleted

## 2012-09-12 VITALS — BP 134/80 | HR 100 | Wt 247.0 lb

## 2012-09-12 DIAGNOSIS — I10 Essential (primary) hypertension: Secondary | ICD-10-CM

## 2012-09-12 DIAGNOSIS — N289 Disorder of kidney and ureter, unspecified: Secondary | ICD-10-CM

## 2012-09-12 DIAGNOSIS — Z0000 Encounter for general adult medical examination without abnormal findings: Secondary | ICD-10-CM

## 2012-09-12 DIAGNOSIS — E119 Type 2 diabetes mellitus without complications: Secondary | ICD-10-CM

## 2012-09-12 DIAGNOSIS — R9431 Abnormal electrocardiogram [ECG] [EKG]: Secondary | ICD-10-CM

## 2012-09-12 DIAGNOSIS — E7800 Pure hypercholesterolemia, unspecified: Secondary | ICD-10-CM

## 2012-09-12 DIAGNOSIS — K5000 Crohn's disease of small intestine without complications: Secondary | ICD-10-CM

## 2012-09-12 DIAGNOSIS — H3320 Serous retinal detachment, unspecified eye: Secondary | ICD-10-CM

## 2012-09-12 DIAGNOSIS — K7689 Other specified diseases of liver: Secondary | ICD-10-CM

## 2012-09-12 DIAGNOSIS — E042 Nontoxic multinodular goiter: Secondary | ICD-10-CM

## 2012-09-12 LAB — CBC WITH DIFFERENTIAL/PLATELET
Basophils Absolute: 0 10*3/uL (ref 0.0–0.1)
Basophils Relative: 0 % (ref 0–1)
Hemoglobin: 12.4 g/dL (ref 12.0–15.0)
MCHC: 34.3 g/dL (ref 30.0–36.0)
Monocytes Relative: 6 % (ref 3–12)
Neutro Abs: 4.1 10*3/uL (ref 1.7–7.7)
Neutrophils Relative %: 60 % (ref 43–77)
RDW: 13.8 % (ref 11.5–15.5)

## 2012-09-12 LAB — LIPID PANEL: Cholesterol: 146 mg/dL (ref 0–200)

## 2012-09-12 LAB — BASIC METABOLIC PANEL
Chloride: 103 mEq/L (ref 96–112)
Creat: 1.25 mg/dL — ABNORMAL HIGH (ref 0.50–1.10)
Potassium: 4.5 mEq/L (ref 3.5–5.3)

## 2012-09-12 LAB — HEPATIC FUNCTION PANEL
Albumin: 4.4 g/dL (ref 3.5–5.2)
Alkaline Phosphatase: 90 U/L (ref 39–117)
Total Protein: 6.9 g/dL (ref 6.0–8.3)

## 2012-09-12 LAB — HEMOGLOBIN A1C: Mean Plasma Glucose: 160 mg/dL — ABNORMAL HIGH (ref <117)

## 2012-09-12 LAB — TSH: TSH: 2.136 u[IU]/mL (ref 0.350–4.500)

## 2012-09-12 MED ORDER — LOSARTAN POTASSIUM-HCTZ 100-25 MG PO TABS: 1.0000 | ORAL_TABLET | Freq: Every day | ORAL | Status: DC

## 2012-09-12 NOTE — Progress Notes (Signed)
Subjective:    Patient ID: Marie Rhodes, female    DOB: January 23, 1936, 76 y.o.   MRN: [id number]  HPI The state of at least three ongoing medical problems is addressed today, with interval history of each noted here: DM: she denies chest pain Dyslipidemia: she denies SOB Renal insufficiency: she denies dysuria Past Medical History  Diagnosis Date  . Fatty liver   . DIABETES MELLITUS, TYPE II 04/27/2007  . CROHN'S DISEASE, SMALL INTESTINE 06/08/2008    ileum, cecum  . GOITER, MULTINODULAR 05/04/2008  . ANXIETY 04/27/2007  . UNSPECIFIED ANEMIA 05/04/2008    Mild Anemia  . HYPERTENSION 04/27/2007  . OBESITY 08/01/2010  . DEPRESSION 04/27/2007  . Hyperlipemia   . GERD (gastroesophageal reflux disease)     Past Surgical History  Procedure Date  . Appendectomy   . Meckel diverticulum excision     Ileal and cecal resection   . Left oophorectomy 1992  . Biopsy thyroid   . Colonoscopy 08/10/2006    normal    History   Social History  . Marital Status: Widowed    Spouse Name: N/A    Number of Children: N/A  . Years of Education: N/A   Occupational History  . 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 Smoking status: Former Research scientist (life sciences)  . Smokeless tobacco: Never Used  . Alcohol Use: No  . Drug Use: No  . Sexually Active: Not on file   Other Topics Concern  . Not on file   Social History Narrative   Pt does get regular exercise - walking at gymFormer Smoker (college only)    Current Outpatient Prescriptions on File Prior to Visit  Medication Sig Dispense Refill  . albuterol (PROVENTIL HFA;VENTOLIN HFA) 108 (90 BASE) MCG/ACT inhaler Inhale 2 puffs into the lungs every 6 (six) hours as needed for wheezing.  1 Inhaler  0  . Cyanocobalamin (VITAMIN B-12) 2500 MCG SUBL Place under the tongue. 1 by mouth in the morning       . folic acid (FOLVITE) 937 MCG tablet Take 400 mcg by mouth daily.        Marland Kitchen glucose blood (ONE TOUCH TEST STRIPS) test strip Check  blood sugar once daily dx 250.00  100 each  3  . mesalamine (PENTASA) 500 MG CR capsule Take 1 capsule (500 mg total) by mouth 2 (two) times daily.  60 capsule  11  . metFORMIN (GLUCOPHAGE XR) 500 MG 24 hr tablet TAKE (2) TABLETS TWICE DAILY.  60 tablet  6  . metoprolol tartrate (LOPRESSOR) 25 MG tablet TAKE (1/2) TABLET TWICE DAILY.  30 tablet  5  . Multiple Vitamin (MULTIVITAMIN) tablet Take 1 tablet by mouth daily.        Marland Kitchen omeprazole (PRILOSEC OTC) 20 MG tablet take1 each day 30 minutes before a meal  30 tablet  11  . pioglitazone (ACTOS) 45 MG tablet TAKE 1 TABLET EACH DAY.  30 tablet  5  . pravastatin (PRAVACHOL) 40 MG tablet TAKE (1) TABLET DAILY AT BEDTIME.  30 tablet  5  . repaglinide (PRANDIN) 2 MG tablet TAKE 1 TABLET THREE TIMES DAILY BEFORE MEALS.  90 tablet  5  . XANAX 0.25 MG tablet TAKE AS NEEDED FOR       ANXIETY.  30 each  2  . bromocriptine (PARLODEL) 2.5 MG tablet Take 1 tablet (2.5 mg total) by mouth daily.  30 tablet  11  . losartan-hydrochlorothiazide (HYZAAR) 100-25  MG per tablet Take 1 tablet by mouth daily.  30 tablet  7    No Known Allergies  Family History  Problem Relation Age of Onset  . Breast cancer Mother 94  . Kidney disease Mother   . Skin cancer Father   . Colon cancer Other     uncle  . Diabetes Other     BP 134/80  Pulse 100  Wt 247 lb (112.038 kg)  SpO2 93%    Review of Systems Denies brbpr and hematuria    Objective:   Physical Exam VS: see vs page GEN: no distress HEAD: head: no deformity eyes: no periorbital swelling, no proptosis external nose and ears are normal mouth: no lesion seen NECK: multinodular goiter is noted CHEST WALL: no deformity LUNGS:  Clear to auscultation BREASTS:  sees gyn CV: reg rate and rhythm, no murmur ABD: abdomen is soft, nontender.  no hepatosplenomegaly.  not distended.  no hernia GENITALIA/RECTAL: sees gyn MUSCULOSKELETAL: muscle bulk and strength are grossly normal.  no obvious joint swelling.   gait is normal and steady EXTEMITIES: no deformity.  no ulcer on the feet.  feet are of normal color and temp.  no edema PULSES: dorsalis pedis intact bilat.  no carotid bruit NEURO:  cn 2-12 grossly intact.   readily moves all 4's.  sensation is intact to touch on the feet SKIN:  Sees derm NODES:  None palpable at the neck PSYCH: alert, oriented x3.  Does not appear anxious nor depressed.  Lab Results  Component Value Date   WBC 6.8 09/12/2012   HGB 12.4 09/12/2012   HCT 36.1 09/12/2012   PLT 215 09/12/2012   GLUCOSE 163* 09/12/2012   CHOL 146 09/12/2012   TRIG 229* 09/12/2012   HDL 56 09/12/2012   LDLDIRECT 71.6 06/24/2010   LDLCALC 44 09/12/2012   ALT 31 09/12/2012   AST 25 09/12/2012   NA 142 09/12/2012   K 4.5 09/12/2012   CL 103 09/12/2012   CREATININE 1.25* 09/12/2012   BUN 26* 09/12/2012   CO2 23 09/12/2012   TSH 2.136 09/12/2012   HGBA1C 7.2* 09/12/2012   MICROALBUR 1.75 09/12/2012      Assessment & Plan:  DM, Needs increased rx, if it can be done with a regimen that avoids or minimizes hypoglycemia. Renal insuff, stable. UTI, new Dyslipidemia: well-controlled, considering this lab is not fasting     Subjective:   Patient here for Medicare annual wellness visit and management of other chronic and acute problems.     Risk factors: advanced age    69 of Physicians Providing Medical Care to Patient:  See "snapshot"   Activities of Daily Living: In your present state of health, do you have any difficulty performing the following activities?:  Preparing food and eating?: No  Bathing yourself: No  Getting dressed: No  Using the toilet:No  Moving around from place to place: No  In the past year have you fallen or had a near fall?: No    Home Safety: Has smoke detector and wears seat belts. No firearms. No excess sun exposure.  Diet and Exercise  Current exercise habits:  Pt says not good Dietary issues discussed: pt reports a healthy diet    Depression Screen  Q1: Over the past two weeks, have you felt down, depressed or hopeless? no  Q2: Over the past two weeks, have you felt little interest or pleasure in doing things? no   The following portions of the patient's history  were reviewed and updated as appropriate: allergies, current medications, past family history, past medical history, past social history, past surgical history and problem list.  Past Medical History  Diagnosis Date  . Fatty liver   . DIABETES MELLITUS, TYPE II 04/27/2007  . CROHN'S DISEASE, SMALL INTESTINE 06/08/2008    ileum, cecum  . GOITER, MULTINODULAR 05/04/2008  . ANXIETY 04/27/2007  . UNSPECIFIED ANEMIA 05/04/2008    Mild Anemia  . HYPERTENSION 04/27/2007  . OBESITY 08/01/2010  . DEPRESSION 04/27/2007  . Hyperlipemia   . GERD (gastroesophageal reflux disease)     Past Surgical History  Procedure Date  . Appendectomy   . Meckel diverticulum excision     Ileal and cecal resection   . Left oophorectomy 1992  . Biopsy thyroid   . Colonoscopy 08/10/2006    normal    History   Social History  . Marital Status: Widowed    Spouse Name: N/A    Number of Children: N/A  . Years of Education: N/A   Occupational History  . 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 Smoking status: Former Research scientist (life sciences)  . Smokeless tobacco: Never Used  . Alcohol Use: No  . Drug Use: No  . Sexually Active: Not on file   Other Topics Concern  . Not on file   Social History Narrative   Pt does get regular exercise - walking at gymFormer Smoker (college only)    Current Outpatient Prescriptions on File Prior to Visit  Medication Sig Dispense Refill  . albuterol (PROVENTIL HFA;VENTOLIN HFA) 108 (90 BASE) MCG/ACT inhaler Inhale 2 puffs into the lungs every 6 (six) hours as needed for wheezing.  1 Inhaler  0  . azithromycin (ZITHROMAX) 250 MG tablet 2 pills day 1, 1 pill days 2-5 po  6 each  0  . Cyanocobalamin (VITAMIN B-12) 2500 MCG  SUBL Place under the tongue. 1 by mouth in the morning       . folic acid (FOLVITE) 644 MCG tablet Take 400 mcg by mouth daily.        Marland Kitchen glucose blood (ONE TOUCH TEST STRIPS) test strip Check blood sugar once daily dx 250.00  100 each  3  . losartan-hydrochlorothiazide (HYZAAR) 100-25 MG per tablet Take 1 tablet by mouth daily.  30 tablet  7  . mesalamine (PENTASA) 500 MG CR capsule Take 1 capsule (500 mg total) by mouth 2 (two) times daily.  60 capsule  11  . metFORMIN (GLUCOPHAGE XR) 500 MG 24 hr tablet TAKE (2) TABLETS TWICE DAILY.  60 tablet  6  . metoprolol tartrate (LOPRESSOR) 25 MG tablet TAKE (1/2) TABLET TWICE DAILY.  30 tablet  5  . Multiple Vitamin (MULTIVITAMIN) tablet Take 1 tablet by mouth daily.        Marland Kitchen omeprazole (PRILOSEC OTC) 20 MG tablet take1 each day 30 minutes before a meal  30 tablet  11  . pioglitazone (ACTOS) 45 MG tablet TAKE 1 TABLET EACH DAY.  30 tablet  5  . pravastatin (PRAVACHOL) 40 MG tablet TAKE (1) TABLET DAILY AT BEDTIME.  30 tablet  5  . predniSONE (DELTASONE) 50 MG tablet Take 1 tablet (50 mg total) by mouth daily.  5 tablet  0  . repaglinide (PRANDIN) 2 MG tablet TAKE 1 TABLET THREE TIMES DAILY BEFORE MEALS.  90 tablet  5  . XANAX 0.25 MG tablet TAKE AS NEEDED FOR       ANXIETY.  30 each  2    No Known Allergies  Family History  Problem Relation Age of Onset  . Breast cancer Mother 69  . Kidney disease Mother   . Skin cancer Father   . Colon cancer Other     uncle  . Diabetes Other     BP 134/80  Pulse 100  Wt 247 lb (112.038 kg)  SpO2 93%   Review of Systems  Denies hearing loss, and visual loss Objective:   Vision:  Sees opthalmologist Hearing: grossly normal Body mass index:  See vs page Msk: pt easily and quickly performs "get-up-and-go" from a sitting position Cognitive Impairment Assessment: cognition, memory and judgment appear normal.  remembers 3/3 at 5 minutes.  excellent recall.  can easily read and write a sentence.  alert  and oriented x 3   Assessment:   Medicare wellness utd on preventive parameters    Plan:   During the course of the visit the patient was educated and counseled about appropriate screening and preventive services including:        Fall prevention   Screening mammography  Bone densitometry screening  Diabetes screening  Nutrition counseling   Vaccines / LABS Zostavax / Pnemonccoal Vaccine  today   Patient Instructions (the written plan) was given to the patient.

## 2012-09-12 NOTE — Patient Instructions (Addendum)
please consider these measures for your health:  minimize alcohol.  do not use tobacco products.  have a colonoscopy at least every 10 years from age 74.  Women should have an annual mammogram from age 50.  keep firearms safely stored.  always use seat belts.  have working smoke alarms in your home.  see an eye doctor and dentist regularly.  never drive under the influence of alcohol or drugs (including prescription drugs).  those with fair skin should take precautions against the sun. please let me know what your wishes would be, if artificial life support measures should become necessary.  it is critically important to prevent falling down (keep floor areas well-lit, dry, and free of loose objects.  If you have a cane, walker, or wheelchair, you should use it, even for short trips around the house.  Also, try not to rush) blood tests are being requested for you today.  We'll contact you with results. Please come back for a follow-up appointment in 6 months.   (update: we discussed code status.  pt requests full code, but would not want to be started or maintained on artificial life-support measures if there was not a reasonable chance of recovery).

## 2012-09-13 LAB — URINALYSIS, ROUTINE W REFLEX MICROSCOPIC
Protein, ur: NEGATIVE mg/dL
Urobilinogen, UA: 0.2 mg/dL (ref 0.0–1.0)

## 2012-09-13 LAB — MICROALBUMIN / CREATININE URINE RATIO
Creatinine, Urine: 298.1 mg/dL
Microalb Creat Ratio: 5.9 mg/g (ref 0.0–30.0)

## 2012-09-13 LAB — URINALYSIS, MICROSCOPIC ONLY
Casts: NONE SEEN
Crystals: NONE SEEN

## 2012-09-13 MED ORDER — BROMOCRIPTINE MESYLATE 2.5 MG PO TABS: 2.5000 mg | ORAL_TABLET | Freq: Every day | ORAL | Status: DC

## 2012-09-14 MED ORDER — CIPROFLOXACIN HCL 250 MG PO TABS: 250.0000 mg | ORAL_TABLET | Freq: Two times a day (BID) | ORAL | Status: DC

## 2012-10-04 ENCOUNTER — Encounter: Payer: Self-pay | Admitting: Endocrinology

## 2012-10-17 ENCOUNTER — Other Ambulatory Visit: Payer: Self-pay | Admitting: Internal Medicine

## 2012-10-17 ENCOUNTER — Other Ambulatory Visit: Payer: Self-pay | Admitting: Endocrinology

## 2012-11-17 ENCOUNTER — Other Ambulatory Visit: Payer: Self-pay | Admitting: Internal Medicine

## 2012-12-09 ENCOUNTER — Other Ambulatory Visit: Payer: Self-pay | Admitting: Dermatology

## 2012-12-26 ENCOUNTER — Other Ambulatory Visit: Payer: Self-pay | Admitting: Internal Medicine

## 2013-01-23 ENCOUNTER — Other Ambulatory Visit: Payer: Self-pay | Admitting: *Deleted

## 2013-01-23 MED ORDER — PRAVASTATIN SODIUM 40 MG PO TABS: ORAL_TABLET | ORAL | Status: DC

## 2013-01-23 MED ORDER — PIOGLITAZONE HCL 45 MG PO TABS: ORAL_TABLET | ORAL | Status: DC

## 2013-01-23 MED ORDER — METOPROLOL TARTRATE 25 MG PO TABS: ORAL_TABLET | ORAL | Status: DC

## 2013-03-13 ENCOUNTER — Ambulatory Visit (INDEPENDENT_AMBULATORY_CARE_PROVIDER_SITE_OTHER): Payer: Medicare Other | Admitting: Endocrinology

## 2013-03-13 ENCOUNTER — Encounter: Payer: Self-pay | Admitting: Endocrinology

## 2013-03-13 VITALS — BP 126/74 | HR 80 | Ht 67.0 in | Wt 247.0 lb

## 2013-03-13 DIAGNOSIS — E119 Type 2 diabetes mellitus without complications: Secondary | ICD-10-CM

## 2013-03-13 DIAGNOSIS — K5000 Crohn's disease of small intestine without complications: Secondary | ICD-10-CM

## 2013-03-13 DIAGNOSIS — N289 Disorder of kidney and ureter, unspecified: Secondary | ICD-10-CM

## 2013-03-13 DIAGNOSIS — E1129 Type 2 diabetes mellitus with other diabetic kidney complication: Secondary | ICD-10-CM

## 2013-03-13 DIAGNOSIS — K50019 Crohn's disease of small intestine with unspecified complications: Secondary | ICD-10-CM

## 2013-03-13 DIAGNOSIS — N390 Urinary tract infection, site not specified: Secondary | ICD-10-CM

## 2013-03-13 LAB — URINALYSIS, ROUTINE W REFLEX MICROSCOPIC
Bilirubin Urine: NEGATIVE
Hgb urine dipstick: NEGATIVE
Nitrite: NEGATIVE
Urobilinogen, UA: 0.2 (ref 0.0–1.0)

## 2013-03-13 LAB — BASIC METABOLIC PANEL
BUN: 21 mg/dL (ref 6–23)
Calcium: 10.3 mg/dL (ref 8.4–10.5)
Chloride: 106 mEq/L (ref 96–112)
Creatinine, Ser: 1.2 mg/dL (ref 0.4–1.2)
GFR: 47.03 mL/min — ABNORMAL LOW (ref >60.00)

## 2013-03-13 LAB — HEMOGLOBIN A1C: Hgb A1c MFr Bld: 8 % — ABNORMAL HIGH (ref 4.6–6.5)

## 2013-03-13 MED ORDER — BROMOCRIPTINE MESYLATE 2.5 MG PO TABS: 2.5000 mg | ORAL_TABLET | Freq: Every day | ORAL | Status: DC

## 2013-03-13 NOTE — Patient Instructions (Addendum)
blood tests are being requested for you today.  We'll contact you with results. check your blood sugar once a day.  vary the time of day when you check, between before the 3 meals, and at bedtime.  also check if you have symptoms of your blood sugar being too high or too low.  please keep a record of the readings and bring it to your next appointment here.  please call us sooner if your blood sugar goes below 70, or if you have a lot of readings over 200.   Please come back for a regular physical appointment in 6 months (after 09/12/13).

## 2013-03-13 NOTE — Progress Notes (Signed)
Subjective:    Patient ID: Marie Rhodes, female    DOB: [birth year], 77 y.o.   MRN: [id number]  HPI Pt returns for f/u of type 2 DM (2008; she has mild if any neuropathy of the lower extremities; she has associated renal insufficiency).  pt states she feels well in general.  no cbg record, but states cbg's are well-controlled.  She did not start parlodel.   Past Medical History  Diagnosis Date  . Fatty liver   . DIABETES MELLITUS, TYPE II 04/27/2007  . CROHN'S DISEASE, SMALL INTESTINE 06/08/2008    ileum, cecum  . GOITER, MULTINODULAR 05/04/2008  . ANXIETY 04/27/2007  . UNSPECIFIED ANEMIA 05/04/2008    Mild Anemia  . HYPERTENSION 04/27/2007  . OBESITY 08/01/2010  . DEPRESSION 04/27/2007  . Hyperlipemia   . GERD (gastroesophageal reflux disease)     Past Surgical History  Procedure Laterality Date  . Appendectomy    . Meckel diverticulum excision      Ileal and cecal resection   . Left oophorectomy  1992  . Biopsy thyroid    . Colonoscopy  08/10/2006    normal    History   Social History  . Marital Status: Widowed    Spouse Name: N/A    Number of Children: N/A  . Years of Education: N/A   Occupational History  . 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 Smoking status: Former Research scientist (life sciences)  . Smokeless tobacco: Never Used  . Alcohol Use: No  . Drug Use: No  . Sexually Active: Not on file   Other Topics Concern  . Not on file   Social History Narrative   Pt does get regular exercise - walking at gym   Former Smoker (college only)    Current Outpatient Prescriptions on File Prior to Visit  Medication Sig Dispense Refill  . Cyanocobalamin (VITAMIN B-12) 2500 MCG SUBL Place under the tongue. 1 by mouth in the morning       . folic acid (FOLVITE) 017 MCG tablet Take 400 mcg by mouth daily.        Marland Kitchen glucose blood (ONE TOUCH TEST STRIPS) test strip Check blood sugar once daily dx 250.00  100 each  3  . losartan-hydrochlorothiazide (HYZAAR) 100-25  MG per tablet Take 1 tablet by mouth daily.  30 tablet  6  . losartan-hydrochlorothiazide (HYZAAR) 100-25 MG per tablet Take 1 tablet by mouth daily.  30 tablet  7  . metFORMIN (GLUCOPHAGE XR) 500 MG 24 hr tablet TAKE (2) TABLETS TWICE DAILY.  60 tablet  6  . metoprolol tartrate (LOPRESSOR) 25 MG tablet TAKE (1/2) TABLET TWICE DAILY.  30 tablet  2  . Multiple Vitamin (MULTIVITAMIN) tablet Take 1 tablet by mouth daily.        Marland Kitchen omeprazole (PRILOSEC) 20 MG capsule TAKE 1 TABLET ONCE DAILY 30 MINUTES BEFORE A MEAL.  30 capsule  6  . PENTASA 500 MG CR capsule TAKE (1) CAPSULE TWICE DAILY.  60 capsule  10  . pioglitazone (ACTOS) 45 MG tablet TAKE 1 TABLET EACH DAY.  30 tablet  2  . pravastatin (PRAVACHOL) 40 MG tablet TAKE (1) TABLET DAILY AT BEDTIME.  30 tablet  2  . PRILOSEC OTC 20 MG tablet TAKE 1 TABLET ONCE DAILY 30 MINUTES BEFORE A MEAL.  42 tablet  0  . repaglinide (PRANDIN) 2 MG tablet TAKE 1 TABLET THREE TIMES DAILY BEFORE MEALS.  90 tablet  3  .  XANAX 0.25 MG tablet TAKE AS NEEDED FOR       ANXIETY.  30 each  2  . albuterol (PROVENTIL HFA;VENTOLIN HFA) 108 (90 BASE) MCG/ACT inhaler Inhale 2 puffs into the lungs every 6 (six) hours as needed for wheezing.  1 Inhaler  0  . ciprofloxacin (CIPRO) 250 MG tablet Take 1 tablet (250 mg total) by mouth 2 (two) times daily.  20 tablet  0   No current facility-administered medications on file prior to visit.    No Known Allergies  Family History  Problem Relation Age of Onset  . Breast cancer Mother 14  . Kidney disease Mother   . Skin cancer Father   . Colon cancer Other     uncle  . Diabetes Other     BP 126/74  Pulse 80  Ht 5' 7"  (1.702 m)  Wt 247 lb (112.038 kg)  BMI 38.68 kg/m2  SpO2 98%  Review of Systems denies hypoglycemia.  She has weight gain.     Objective:   Physical Exam VITAL SIGNS:  See vs page GENERAL: no distress  Lab Results  Component Value Date   WBC 6.8 09/12/2012   HGB 12.4 09/12/2012   HCT 36.1  09/12/2012   PLT 215 09/12/2012   GLUCOSE 88 03/13/2013   CHOL 146 09/12/2012   TRIG 229* 09/12/2012   HDL 56 09/12/2012   LDLDIRECT 71.6 06/24/2010   LDLCALC 44 09/12/2012   ALT 31 09/12/2012   AST 25 09/12/2012   NA 140 03/13/2013   K 4.6 03/13/2013   CL 106 03/13/2013   CREATININE 1.2 03/13/2013   BUN 21 03/13/2013   CO2 27 03/13/2013   TSH 2.136 09/12/2012   HGBA1C 8.0* 03/13/2013   MICROALBUR 1.75 09/12/2012      Assessment & Plan:  We discussed the nine oral agents available for type 2 diabetes.  This regimen gives the best risk-benefit ratio.  She needs increased rx. Renal insuff, improved

## 2013-04-05 ENCOUNTER — Other Ambulatory Visit: Payer: Self-pay | Admitting: Endocrinology

## 2013-05-08 ENCOUNTER — Other Ambulatory Visit: Payer: Self-pay | Admitting: Endocrinology

## 2013-05-10 ENCOUNTER — Other Ambulatory Visit: Payer: Self-pay | Admitting: Endocrinology

## 2013-06-06 ENCOUNTER — Other Ambulatory Visit: Payer: Self-pay | Admitting: Endocrinology

## 2013-06-07 ENCOUNTER — Other Ambulatory Visit: Payer: Self-pay | Admitting: *Deleted

## 2013-06-07 DIAGNOSIS — I10 Essential (primary) hypertension: Secondary | ICD-10-CM

## 2013-06-07 MED ORDER — LOSARTAN POTASSIUM-HCTZ 100-25 MG PO TABS: 1.0000 | ORAL_TABLET | Freq: Every day | ORAL | Status: DC

## 2013-06-07 NOTE — Telephone Encounter (Signed)
rx refill

## 2013-06-13 ENCOUNTER — Ambulatory Visit (INDEPENDENT_AMBULATORY_CARE_PROVIDER_SITE_OTHER): Payer: Medicare Other | Admitting: Endocrinology

## 2013-06-13 ENCOUNTER — Encounter: Payer: Self-pay | Admitting: Endocrinology

## 2013-06-13 VITALS — BP 128/80 | HR 80 | Wt 247.0 lb

## 2013-06-13 DIAGNOSIS — E119 Type 2 diabetes mellitus without complications: Secondary | ICD-10-CM

## 2013-06-13 NOTE — Patient Instructions (Addendum)
A diabetes blood test is requested for you today.  We'll contact you with results. check your blood sugar once a day.  vary the time of day when you check, between before the 3 meals, and at bedtime.  also check if you have symptoms of your blood sugar being too high or too low.  please keep a record of the readings and bring it to your next appointment here.  please call us sooner if your blood sugar goes below 70, or if you have a lot of readings over 200.   Please come back for a regular physical appointment in early 2015.

## 2013-06-13 NOTE — Progress Notes (Signed)
Subjective:    Patient ID: Marie Rhodes, female    DOB: 1935-11-14, 77 y.o.   MRN: [id number]  HPI Pt returns for f/u of type 2 DM (2008; she has mild if any neuropathy of the lower extremities; she has associated renal insufficiency).  pt states she feels well in general.  no cbg record, but states cbg's are well-controlled.  She again did not start parlodel.  She says she will consider if today's a1c is high.  She says she tolerates the metformin well. Past Medical History  Diagnosis Date  . Fatty liver   . DIABETES MELLITUS, TYPE II 04/27/2007  . CROHN'S DISEASE, SMALL INTESTINE 06/08/2008    ileum, cecum  . GOITER, MULTINODULAR 05/04/2008  . ANXIETY 04/27/2007  . UNSPECIFIED ANEMIA 05/04/2008    Mild Anemia  . HYPERTENSION 04/27/2007  . OBESITY 08/01/2010  . DEPRESSION 04/27/2007  . Hyperlipemia   . GERD (gastroesophageal reflux disease)     Past Surgical History  Procedure Laterality Date  . Appendectomy    . Meckel diverticulum excision      Ileal and cecal resection   . Left oophorectomy  1992  . Biopsy thyroid    . Colonoscopy  08/10/2006    normal    History   Social History  . Marital Status: Widowed    Spouse Name: N/A    Number of Children: N/A  . Years of Education: N/A   Occupational History  . 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 Smoking status: Former Research scientist (life sciences)  . Smokeless tobacco: Never Used  . Alcohol Use: No  . Drug Use: No  . Sexual Activity: Not on file   Other Topics Concern  . Not on file   Social History Narrative   Pt does get regular exercise - walking at gym   Former Smoker (college only)    Current Outpatient Prescriptions on File Prior to Visit  Medication Sig Dispense Refill  . albuterol (PROVENTIL HFA;VENTOLIN HFA) 108 (90 BASE) MCG/ACT inhaler Inhale 2 puffs into the lungs every 6 (six) hours as needed for wheezing.  1 Inhaler  0  . Cyanocobalamin (VITAMIN B-12) 2500 MCG SUBL Place under the tongue.  1 by mouth in the morning       . folic acid (FOLVITE) 606 MCG tablet Take 400 mcg by mouth daily.        Marland Kitchen glucose blood (ONE TOUCH TEST STRIPS) test strip Check blood sugar once daily dx 250.00  100 each  3  . losartan-hydrochlorothiazide (HYZAAR) 100-25 MG per tablet TAKE 1 TABLET DAILY.  30 tablet  3  . losartan-hydrochlorothiazide (HYZAAR) 100-25 MG per tablet Take 1 tablet by mouth daily.  30 tablet  7  . metFORMIN (GLUCOPHAGE-XR) 500 MG 24 hr tablet TAKE (2) TABLETS TWICE DAILY.  120 tablet  6  . metoprolol tartrate (LOPRESSOR) 25 MG tablet TAKE (1/2) TABLET TWICE DAILY.  30 tablet  3  . Multiple Vitamin (MULTIVITAMIN) tablet Take 1 tablet by mouth daily.        Marland Kitchen omeprazole (PRILOSEC) 20 MG capsule TAKE 1 TABLET ONCE DAILY 30 MINUTES BEFORE A MEAL.  30 capsule  6  . PENTASA 500 MG CR capsule TAKE (1) CAPSULE TWICE DAILY.  60 capsule  10  . pioglitazone (ACTOS) 45 MG tablet TAKE 1 TABLET EACH DAY.  30 tablet  3  . pravastatin (PRAVACHOL) 40 MG tablet TAKE (1) TABLET DAILY AT BEDTIME.  [place]  tablet  3  . repaglinide (PRANDIN) 2 MG tablet TAKE 1 TABLET THREE TIMES DAILY BEFORE MEALS.  90 tablet  3  . XANAX 0.25 MG tablet TAKE AS NEEDED FOR       ANXIETY.  30 each  2  . PRILOSEC OTC 20 MG tablet TAKE 1 TABLET ONCE DAILY 30 MINUTES BEFORE A MEAL.  42 tablet  0   No current facility-administered medications on file prior to visit.    No Known Allergies  Family History  Problem Relation Age of Onset  . Breast cancer Mother 85  . Kidney disease Mother   . Skin cancer Father   . Colon cancer Other     uncle  . Diabetes Other    BP 128/80  Pulse 80  Wt 247 lb (112.038 kg)  BMI 38.68 kg/m2  SpO2 97%  Review of Systems denies hypoglycemia and weight change    Objective:   Physical Exam VITAL SIGNS:  See vs page GENERAL: no distress   Lab Results  Component Value Date   HGBA1C 6.4 06/13/2013      Assessment & Plan:  DM: well-controlled Renal insufficiency: this increases  the risk of hypoglycemia. Crohn's dz.  Despite this, she tolerates the metformin well.

## 2013-09-08 ENCOUNTER — Other Ambulatory Visit: Payer: Self-pay | Admitting: Endocrinology

## 2013-09-15 ENCOUNTER — Encounter: Payer: Medicare Other | Admitting: Endocrinology

## 2013-10-02 ENCOUNTER — Encounter: Payer: Self-pay | Admitting: Endocrinology

## 2013-10-02 ENCOUNTER — Ambulatory Visit (INDEPENDENT_AMBULATORY_CARE_PROVIDER_SITE_OTHER): Payer: Medicare Other | Admitting: Endocrinology

## 2013-10-02 VITALS — BP 116/84 | HR 102 | Temp 97.8°F | Ht 67.0 in | Wt 247.0 lb

## 2013-10-02 DIAGNOSIS — R9431 Abnormal electrocardiogram [ECG] [EKG]: Secondary | ICD-10-CM

## 2013-10-02 DIAGNOSIS — Z780 Asymptomatic menopausal state: Secondary | ICD-10-CM

## 2013-10-02 DIAGNOSIS — E119 Type 2 diabetes mellitus without complications: Secondary | ICD-10-CM

## 2013-10-02 DIAGNOSIS — E7800 Pure hypercholesterolemia, unspecified: Secondary | ICD-10-CM

## 2013-10-02 DIAGNOSIS — K5000 Crohn's disease of small intestine without complications: Secondary | ICD-10-CM

## 2013-10-02 DIAGNOSIS — K7689 Other specified diseases of liver: Secondary | ICD-10-CM

## 2013-10-02 DIAGNOSIS — N951 Menopausal and female climacteric states: Secondary | ICD-10-CM

## 2013-10-02 DIAGNOSIS — E042 Nontoxic multinodular goiter: Secondary | ICD-10-CM

## 2013-10-02 DIAGNOSIS — N289 Disorder of kidney and ureter, unspecified: Secondary | ICD-10-CM

## 2013-10-02 LAB — LIPID PANEL
CHOL/HDL RATIO: 3
Cholesterol: 134 mg/dL (ref 0–200)
HDL: 49.2 mg/dL (ref >39.00)
LDL Cholesterol: 50 mg/dL (ref 0–99)
Triglycerides: 174 mg/dL — ABNORMAL HIGH (ref 0.0–149.0)
VLDL: 34.8 mg/dL (ref 0.0–40.0)

## 2013-10-02 LAB — URINALYSIS, ROUTINE W REFLEX MICROSCOPIC
Hgb urine dipstick: NEGATIVE
NITRITE: NEGATIVE
PH: 5 (ref 5.0–8.0)
UROBILINOGEN UA: 0.2 (ref 0.0–1.0)
Urine Glucose: NEGATIVE

## 2013-10-02 LAB — CBC WITH DIFFERENTIAL/PLATELET
Basophils Absolute: 0 10*3/uL (ref 0.0–0.1)
Basophils Relative: 0.3 % (ref 0.0–3.0)
Eosinophils Absolute: 0.3 10*3/uL (ref 0.0–0.7)
Eosinophils Relative: 4.7 % (ref 0.0–5.0)
HCT: 34.7 % — ABNORMAL LOW (ref 36.0–46.0)
HEMOGLOBIN: 11.6 g/dL — AB (ref 12.0–15.0)
LYMPHS PCT: 26.1 % (ref 12.0–46.0)
Lymphs Abs: 1.8 10*3/uL (ref 0.7–4.0)
MCHC: 33.6 g/dL (ref 30.0–36.0)
MCV: 89.3 fl (ref 78.0–100.0)
Monocytes Absolute: 0.4 10*3/uL (ref 0.1–1.0)
Monocytes Relative: 5.9 % (ref 3.0–12.0)
NEUTROS ABS: 4.2 10*3/uL (ref 1.4–7.7)
NEUTROS PCT: 63 % (ref 43.0–77.0)
Platelets: 263 10*3/uL (ref 150.0–400.0)
RBC: 3.88 Mil/uL (ref 3.87–5.11)
RDW: 13.4 % (ref 11.5–14.6)
WBC: 6.7 10*3/uL (ref 4.5–10.5)

## 2013-10-02 LAB — HEPATIC FUNCTION PANEL
ALBUMIN: 4 g/dL (ref 3.5–5.2)
ALT: 17 U/L (ref 0–35)
AST: 23 U/L (ref 0–37)
Alkaline Phosphatase: 75 U/L (ref 39–117)
Bilirubin, Direct: 0 mg/dL (ref 0.0–0.3)
Total Bilirubin: 0.6 mg/dL (ref 0.3–1.2)
Total Protein: 7.1 g/dL (ref 6.0–8.3)

## 2013-10-02 LAB — TSH: TSH: 1.79 u[IU]/mL (ref 0.35–5.50)

## 2013-10-02 LAB — BASIC METABOLIC PANEL
BUN: 27 mg/dL — ABNORMAL HIGH (ref 6–23)
CALCIUM: 10.1 mg/dL (ref 8.4–10.5)
CO2: 28 mEq/L (ref 19–32)
Chloride: 105 mEq/L (ref 96–112)
Creatinine, Ser: 1.6 mg/dL — ABNORMAL HIGH (ref 0.4–1.2)
GFR: 33.61 mL/min — ABNORMAL LOW (ref >60.00)
Glucose, Bld: 109 mg/dL — ABNORMAL HIGH (ref 70–99)
Potassium: 4.7 mEq/L (ref 3.5–5.1)
Sodium: 141 mEq/L (ref 135–145)

## 2013-10-02 LAB — HEMOGLOBIN A1C: Hgb A1c MFr Bld: 6 % (ref 4.6–6.5)

## 2013-10-02 NOTE — Progress Notes (Signed)
Subjective:    Patient ID: Marie Rhodes, female    DOB: [birth year], 78 y.o.   MRN: [id number]  HPI we discussed code status.  pt requests full code, but would not want to be started or maintained on artificial life-support measures if there was not a reasonable chance of recovery Past Medical History  Diagnosis Date  . Fatty liver   . DIABETES MELLITUS, TYPE II 04/27/2007  . CROHN'S DISEASE, SMALL INTESTINE 06/08/2008    ileum, cecum  . GOITER, MULTINODULAR 05/04/2008  . ANXIETY 04/27/2007  . UNSPECIFIED ANEMIA 05/04/2008    Mild Anemia  . HYPERTENSION 04/27/2007  . OBESITY 08/01/2010  . DEPRESSION 04/27/2007  . Hyperlipemia   . GERD (gastroesophageal reflux disease)     Past Surgical History  Procedure Laterality Date  . Appendectomy    . Meckel diverticulum excision      Ileal and cecal resection   . Left oophorectomy  1992  . Biopsy thyroid    . Colonoscopy  08/10/2006    normal    History   Social History  . Marital Status: Widowed    Spouse Name: N/A    Number of Children: N/A  . Years of Education: N/A   Occupational History  . 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 Smoking status: Former Research scientist (life sciences)  . Smokeless tobacco: Never Used  . Alcohol Use: No  . Drug Use: No  . Sexual Activity: Not on file   Other Topics Concern  . Not on file   Social History Narrative   Pt does get regular exercise - walking at gym   Former Smoker (college only)    Current Outpatient Prescriptions on File Prior to Visit  Medication Sig Dispense Refill  . albuterol (PROVENTIL HFA;VENTOLIN HFA) 108 (90 BASE) MCG/ACT inhaler Inhale 2 puffs into the lungs every 6 (six) hours as needed for wheezing.  1 Inhaler  0  . Cyanocobalamin (VITAMIN B-12) 2500 MCG SUBL Place under the tongue. 1 by mouth in the morning       . folic acid (FOLVITE) 270 MCG tablet Take 400 mcg by mouth daily.        Marland Kitchen glucose blood (ONE TOUCH TEST STRIPS) test strip Check blood  sugar once daily dx 250.00  100 each  3  . losartan-hydrochlorothiazide (HYZAAR) 100-25 MG per tablet Take 1 tablet by mouth daily.  30 tablet  7  . metFORMIN (GLUCOPHAGE-XR) 500 MG 24 hr tablet TAKE (2) TABLETS TWICE DAILY.  120 tablet  6  . metoprolol tartrate (LOPRESSOR) 25 MG tablet TAKE (1/2) TABLET TWICE DAILY.  30 tablet  3  . Multiple Vitamin (MULTIVITAMIN) tablet Take 1 tablet by mouth daily.        Marland Kitchen omeprazole (PRILOSEC) 20 MG capsule TAKE 1 TABLET ONCE DAILY 30 MINUTES BEFORE A MEAL.  30 capsule  6  . PENTASA 500 MG CR capsule TAKE (1) CAPSULE TWICE DAILY.  60 capsule  10  . pioglitazone (ACTOS) 45 MG tablet TAKE 1 TABLET EACH DAY.  30 tablet  3  . pravastatin (PRAVACHOL) 40 MG tablet TAKE (1) TABLET DAILY AT BEDTIME.  30 tablet  3  . PRILOSEC OTC 20 MG tablet TAKE 1 TABLET ONCE DAILY 30 MINUTES BEFORE A MEAL.  42 tablet  0  . XANAX 0.25 MG tablet TAKE AS NEEDED FOR       ANXIETY.  30 each  2  . repaglinide (PRANDIN) 2  MG tablet TAKE 1 TABLET THREE TIMES DAILY BEFORE MEALS.  90 tablet  3   No current facility-administered medications on file prior to visit.    No Known Allergies  Family History  Problem Relation Age of Onset  . Breast cancer Mother 25  . Kidney disease Mother   . Skin cancer Father   . Colon cancer Other     uncle  . Diabetes Other     BP 116/84  Pulse 102  Temp(Src) 97.8 F (36.6 C) (Oral)  Ht 5' 7"  (1.702 m)  Wt 247 lb (112.038 kg)  BMI 38.68 kg/m2  SpO2 97%   Review of Systems  Constitutional: Negative for fever and unexpected weight change.  HENT: Negative for hearing loss.   Eyes: Negative for visual disturbance.  Respiratory: Negative for shortness of breath.   Cardiovascular: Negative for chest pain.  Gastrointestinal: Negative for anal bleeding.  Endocrine: Negative for cold intolerance.  Genitourinary: Negative for hematuria.  Musculoskeletal: Positive for back pain.  Skin: Negative for rash.  Allergic/Immunologic: Negative for  environmental allergies.  Neurological: Negative for syncope and numbness.  Hematological: Bruises/bleeds easily.  Psychiatric/Behavioral: Negative for dysphoric mood.       Objective:   Physical Exam VS: see vs page GEN: no distress.  Morbid obesity. HEAD: head: no deformity eyes: no periorbital swelling, no proptosis external nose and ears are normal mouth: no lesion seen NECK: multinodular goiter is again noted. CHEST WALL: no deformity LUNGS:  Clear to auscultation BREASTS:  sees gyn CV: reg rate and rhythm, no murmur ABD: abdomen is soft, nontender.  no hepatosplenomegaly.  not distended.  no hernia GENITALIA/RECTAL: sees gyn MUSCULOSKELETAL: muscle bulk and strength are grossly normal.  no obvious joint swelling.  gait is normal and steady EXTEMITIES: no deformity.  no ulcer on the feet.  feet are of normal color and temp.  no edema PULSES: dorsalis pedis intact bilat.  no carotid bruit NEURO:  cn 2-12 grossly intact.   readily moves all 4's.  sensation is intact to touch on the feet SKIN:  Normal texture and temperature.  No rash or suspicious lesion is visible.   NODES:  None palpable at the neck PSYCH: alert, well-oriented.  Does not appear anxious nor depressed.   i reviewed electrocardiogram.     Assessment & Plan:  Wellness visit today, with problems stable, except as noted. we discussed code status.  pt requests full code, but would not want to be started or maintained on artificial life-support measures if there was not a reasonable chance of recovery.

## 2013-10-02 NOTE — Patient Instructions (Addendum)
Blood and urine tests are being requested for you today.  We'll contact you with results.   check your blood sugar once a day.  vary the time of day when you check, between before the 3 meals, and at bedtime.  also check if you have symptoms of your blood sugar being too high or too low.  please keep a record of the readings and bring it to your next appointment here.  please call us sooner if your blood sugar goes below 70, or if you have a lot of readings over 200.   please consider these measures for your health:  minimize alcohol.  do not use tobacco products.  have a colonoscopy at least every 10 years from age 74.  Women should have an annual mammogram from age 22.  keep firearms safely stored.  always use seat belts.  have working smoke alarms in your home.  see an eye doctor and dentist regularly.  never drive under the influence of alcohol or drugs (including prescription drugs).  those with fair skin should take precautions against the sun.   it is critically important to prevent falling down (keep floor areas well-lit, dry, and free of loose objects.  If you have a cane, walker, or wheelchair, you should use it, even for short trips around the house.  Also, try not to rush).   Please come back for a follow-up appointment in 6 months.

## 2013-10-03 LAB — MICROALBUMIN / CREATININE URINE RATIO
Creatinine,U: 547.4 mg/dL
MICROALB/CREAT RATIO: 1.3 mg/g (ref 0.0–30.0)
Microalb, Ur: 6.9 mg/dL — ABNORMAL HIGH (ref 0.0–1.9)

## 2013-10-03 LAB — PTH, INTACT AND CALCIUM
CALCIUM: 9.9 mg/dL (ref 8.4–10.5)
PTH: 70.7 pg/mL (ref 14.0–72.0)

## 2013-10-09 ENCOUNTER — Ambulatory Visit
Admission: RE | Admit: 2013-10-09 | Discharge: 2013-10-09 | Disposition: A | Discharge status: Home / Self Care | Payer: Medicare Other | Source: Ambulatory Visit | Attending: Endocrinology | Admitting: Endocrinology

## 2013-10-09 ENCOUNTER — Encounter: Payer: Self-pay | Admitting: Endocrinology

## 2013-10-09 ENCOUNTER — Ambulatory Visit (INDEPENDENT_AMBULATORY_CARE_PROVIDER_SITE_OTHER)
Admission: RE | Admit: 2013-10-09 | Discharge: 2013-10-09 | Disposition: A | Discharge status: Home / Self Care | Payer: Medicare Other | Source: Ambulatory Visit | Attending: Endocrinology | Admitting: Endocrinology

## 2013-10-09 DIAGNOSIS — Z780 Asymptomatic menopausal state: Secondary | ICD-10-CM

## 2013-10-09 DIAGNOSIS — N951 Menopausal and female climacteric states: Secondary | ICD-10-CM

## 2013-10-09 DIAGNOSIS — E042 Nontoxic multinodular goiter: Secondary | ICD-10-CM

## 2013-10-11 ENCOUNTER — Other Ambulatory Visit: Payer: Medicare Other

## 2013-11-12 ENCOUNTER — Other Ambulatory Visit: Payer: Self-pay | Admitting: Endocrinology

## 2013-12-13 ENCOUNTER — Telehealth: Payer: Self-pay

## 2013-12-13 NOTE — Telephone Encounter (Signed)
Left message for patient to call and set up appointment , last seen 09/2011.  Pharmacy sent refill request today for Pentasa, we can send enough in to cover until appointment once she makes one.

## 2013-12-14 ENCOUNTER — Other Ambulatory Visit: Payer: Self-pay | Admitting: Internal Medicine

## 2013-12-18 ENCOUNTER — Encounter: Payer: Self-pay | Admitting: Gastroenterology

## 2013-12-18 ENCOUNTER — Ambulatory Visit (INDEPENDENT_AMBULATORY_CARE_PROVIDER_SITE_OTHER): Payer: Medicare Other | Admitting: Gastroenterology

## 2013-12-18 VITALS — BP 104/58 | HR 88 | Ht 67.0 in | Wt 252.4 lb

## 2013-12-18 DIAGNOSIS — K5000 Crohn's disease of small intestine without complications: Secondary | ICD-10-CM

## 2013-12-18 MED ORDER — RANITIDINE HCL 75 MG PO TABS: 75.0000 mg | ORAL_TABLET | Freq: Every day | ORAL | Status: DC

## 2013-12-18 MED ORDER — MESALAMINE ER 500 MG PO CPCR: 500.0000 mg | ORAL_CAPSULE | Freq: Two times a day (BID) | ORAL | Status: DC

## 2013-12-18 NOTE — Telephone Encounter (Signed)
Patient has scheduled an appointment with Alonza Bogus, PA-C today.

## 2013-12-18 NOTE — Progress Notes (Signed)
12/18/2013 Marie Rhodes [id number] Dec 10, 1935   History of Present Illness:  This is a pleasant 78 year old female who is known to Dr. Carlean Purl for treatment of her small bowel Crohn's disease.  She is only taking Pentasa 500 mg twice a day and is asymptomatic on that medication. Has usually between 2 and 4 bowel movements a day, sometimes formed and sometimes soft to loose. She denies abdominal pain or blood in her stools.  Last colonoscopy was in November 2007 performed by Dr. Velora Heckler and at which time procedure was normal.  She is currently only taking Zantac 75 mg daily for her reflux and is doing well with that also.  Recent labs show a slightly low hemoglobin of 11.6 grams. Otherwise CBC, TSH, hepatic function panel, and BMP are unremarkable from a GI standpoint.   Current Medications, Allergies, Past Medical History, Past Surgical History, Family History and Social History were reviewed in [facility] record.   Physical Exam: BP 104/58  Pulse 88  Ht 5' 7"  (1.702 m)  Wt 252 lb 6.4 oz (114.488 kg)  BMI 39.52 kg/m2 General: Well developed, white female in no acute distress Head: Normocephalic and atraumatic Eyes:  Sclerae anicteric, conjunctiva pink  Ears: Normal auditory acuity Lungs: Clear throughout to auscultation Heart: Regular rate and rhythm Abdomen: Soft, non-distended.  Normal bowel sounds.  Non-tender. Rectal:  Not performed. Musculoskeletal: Symmetrical with no gross deformities  Extremities: No edema  Neurological: Alert oriented x 4, grossly non-focal Psychological:  Alert and cooperative. Normal mood and affect  Assessment and Recommendations: -Small bowel Crohn's disease:  Controlled on Pentasa 500 mg twice a day. We will refill this medication. She was also inquiring about colonoscopy and last procedure was in November 2007. We will schedule with Dr. Carlean Purl, but the patient wants to call back and return for a pre-visit appointment since  she has another appt to get to today and does not have time to stay and schedule. -GERD:  Well controlled on Zantac 75 mg daily. We will refill this medication for her as well.

## 2013-12-18 NOTE — Patient Instructions (Addendum)
It has been recommended to you by your physician that you have a(n) Colonoscopy completed. Per your request, we did not schedule the procedure(s) today. Please contact our office at [phone] should you decide to have the procedure completed.  We have sent the following medications to your pharmacy for you to pick up at your convenience: Pentasa  Zantac

## 2013-12-20 NOTE — Progress Notes (Signed)
Agree with Marie Rhodes. Colonoscopy reasonable to recheck Crohn's

## 2014-01-13 ENCOUNTER — Other Ambulatory Visit: Payer: Self-pay | Admitting: Endocrinology

## 2014-02-15 ENCOUNTER — Other Ambulatory Visit: Payer: Self-pay | Admitting: Gastroenterology

## 2014-02-15 ENCOUNTER — Other Ambulatory Visit: Payer: Self-pay | Admitting: Endocrinology

## 2014-03-05 ENCOUNTER — Other Ambulatory Visit: Payer: Medicare Other

## 2014-03-05 ENCOUNTER — Ambulatory Visit (INDEPENDENT_AMBULATORY_CARE_PROVIDER_SITE_OTHER): Payer: Medicare Other | Admitting: Endocrinology

## 2014-03-05 ENCOUNTER — Encounter: Payer: Self-pay | Admitting: Endocrinology

## 2014-03-05 ENCOUNTER — Other Ambulatory Visit (INDEPENDENT_AMBULATORY_CARE_PROVIDER_SITE_OTHER): Payer: Medicare Other

## 2014-03-05 VITALS — BP 122/72 | HR 82 | Temp 98.3°F | Ht 67.0 in | Wt 253.0 lb

## 2014-03-05 DIAGNOSIS — N289 Disorder of kidney and ureter, unspecified: Secondary | ICD-10-CM

## 2014-03-05 DIAGNOSIS — E1129 Type 2 diabetes mellitus with other diabetic kidney complication: Secondary | ICD-10-CM

## 2014-03-05 DIAGNOSIS — E119 Type 2 diabetes mellitus without complications: Secondary | ICD-10-CM | POA: Insufficient documentation

## 2014-03-05 DIAGNOSIS — D649 Anemia, unspecified: Secondary | ICD-10-CM

## 2014-03-05 LAB — CBC WITH DIFFERENTIAL/PLATELET
BASOS ABS: 0 10*3/uL (ref 0.0–0.1)
BASOS PCT: 0.3 % (ref 0.0–3.0)
EOS ABS: 0.3 10*3/uL (ref 0.0–0.7)
Eosinophils Relative: 4 % (ref 0.0–5.0)
HCT: 33 % — ABNORMAL LOW (ref 36.0–46.0)
HEMOGLOBIN: 10.9 g/dL — AB (ref 12.0–15.0)
LYMPHS ABS: 2.1 10*3/uL (ref 0.7–4.0)
LYMPHS PCT: 28.7 % (ref 12.0–46.0)
MCHC: 33 g/dL (ref 30.0–36.0)
MCV: 90.8 fl (ref 78.0–100.0)
Monocytes Absolute: 0.5 10*3/uL (ref 0.1–1.0)
Monocytes Relative: 7.6 % (ref 3.0–12.0)
NEUTROS ABS: 4.3 10*3/uL (ref 1.4–7.7)
Neutrophils Relative %: 59.4 % (ref 43.0–77.0)
Platelets: 236 10*3/uL (ref 150.0–400.0)
RBC: 3.63 Mil/uL — AB (ref 3.87–5.11)
RDW: 13.8 % (ref 11.5–15.5)
WBC: 7.2 10*3/uL (ref 4.0–10.5)

## 2014-03-05 LAB — BASIC METABOLIC PANEL
BUN: 28 mg/dL — ABNORMAL HIGH (ref 6–23)
CHLORIDE: 103 meq/L (ref 96–112)
CO2: 30 mEq/L (ref 19–32)
Calcium: 10.1 mg/dL (ref 8.4–10.5)
Creatinine, Ser: 1.3 mg/dL — ABNORMAL HIGH (ref 0.4–1.2)
GFR: 43.91 mL/min — ABNORMAL LOW (ref >60.00)
Glucose, Bld: 78 mg/dL (ref 70–99)
Potassium: 4.5 mEq/L (ref 3.5–5.1)
SODIUM: 138 meq/L (ref 135–145)

## 2014-03-05 LAB — HEMOGLOBIN A1C: HEMOGLOBIN A1C: 6.1 % (ref 4.6–6.5)

## 2014-03-05 LAB — IBC PANEL
Iron: 85 ug/dL (ref 42–145)
SATURATION RATIOS: 15.5 % — AB (ref 20.0–50.0)
Transferrin: 392.4 mg/dL — ABNORMAL HIGH (ref 212.0–360.0)

## 2014-03-05 MED ORDER — ALPRAZOLAM 0.25 MG PO TABS: ORAL_TABLET | ORAL | Status: DC

## 2014-03-05 NOTE — Progress Notes (Signed)
Subjective:    Patient ID: Marie Rhodes, female    DOB: 1935/11/06, 78 y.o.   MRN: [id number]  HPI Pt returns for f/u of type 2 DM (2008; she has mild if any neuropathy of the lower extremities; she has associated renal insufficiency; she has never taken insulin; she had never had GDM or pancreatitis).  pt states she feels well in general.  no cbg record, but states cbg's are well-controlled.  She says she tolerates the metformin well.   Past Medical History  Diagnosis Date  . Fatty liver   . DIABETES MELLITUS, TYPE II 04/27/2007  . CROHN'S DISEASE, SMALL INTESTINE 06/08/2008    ileum, cecum  . GOITER, MULTINODULAR 05/04/2008  . ANXIETY 04/27/2007  . UNSPECIFIED ANEMIA 05/04/2008    Mild Anemia  . HYPERTENSION 04/27/2007  . OBESITY 08/01/2010  . DEPRESSION 04/27/2007  . Hyperlipemia   . GERD (gastroesophageal reflux disease)     Past Surgical History  Procedure Laterality Date  . Appendectomy    . Meckel diverticulum excision      Ileal and cecal resection   . Left oophorectomy  1992  . Biopsy thyroid    . Colonoscopy  08/10/2006    normal    History   Social History  . Marital Status: Widowed    Spouse Name: N/A    Number of Children: N/A  . Years of Education: N/A   Occupational History  . 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 Smoking status: Former Research scientist (life sciences)  . Smokeless tobacco: Never Used  . Alcohol Use: No  . Drug Use: No  . Sexual Activity: Not on file   Other Topics Concern  . Not on file   Social History Narrative   Pt does get regular exercise - walking at gym   Former Smoker (college only)    Current Outpatient Prescriptions on File Prior to Visit  Medication Sig Dispense Refill  . Cyanocobalamin (VITAMIN B-12) 2500 MCG SUBL Place under the tongue. 1 by mouth in the morning       . folic acid (FOLVITE) 413 MCG tablet Take 400 mcg by mouth daily.        Marland Kitchen losartan-hydrochlorothiazide (HYZAAR) 100-25 MG per tablet TAKE 1  TABLET DAILY.  30 tablet  2  . metFORMIN (GLUCOPHAGE-XR) 500 MG 24 hr tablet TAKE (2) TABLETS TWICE DAILY.  120 tablet  1  . metoprolol tartrate (LOPRESSOR) 25 MG tablet TAKE (1/2) TABLET TWICE DAILY.  30 tablet  2  . Multiple Vitamin (MULTIVITAMIN) tablet Take 1 tablet by mouth daily.        . ONE TOUCH ULTRA TEST test strip CHECK BLOOD SUGAR ONCE A DAY.  50 each  2  . PENTASA 500 MG CR capsule TAKE (1) CAPSULE TWICE DAILY.  60 capsule  3  . pioglitazone (ACTOS) 45 MG tablet TAKE 1 TABLET EACH DAY.  30 tablet  2  . pravastatin (PRAVACHOL) 40 MG tablet TAKE (1) TABLET DAILY AT BEDTIME.  30 tablet  2  . ranitidine (ZANTAC 75) 75 MG tablet Take 1 tablet (75 mg total) by mouth daily.  30 tablet  1  . repaglinide (PRANDIN) 2 MG tablet TAKE 1 TABLET THREE TIMES DAILY BEFORE MEALS.  90 tablet  3   No current facility-administered medications on file prior to visit.    No Known Allergies  Family History  Problem Relation Age of Onset  . Breast cancer Mother 56  .  Kidney disease Mother   . Skin cancer Father   . Colon cancer Other     uncle  . Diabetes Other     BP 122/72  Pulse 82  Temp(Src) 98.3 F (36.8 C) (Oral)  Ht 5' 7"  (1.702 m)  Wt 253 lb (114.76 kg)  BMI 39.62 kg/m2  SpO2 93%    Review of Systems She denies hypoglycemia and weight change.      Objective:   Physical Exam VITAL SIGNS:  See vs page GENERAL: no distress Pulses: dorsalis pedis intact bilat.   Feet: no deformity. normal color and temp.  no edema Skin:  no ulcer on the feet.   Neuro: sensation is intact to touch on the feet   Lab Results  Component Value Date   HGBA1C 6.1 03/05/2014   Lab Results  Component Value Date   WBC 7.2 03/05/2014   HGB 10.9* 03/05/2014   HCT 33.0* 03/05/2014   MCV 90.8 03/05/2014   PLT 236.0 03/05/2014   Lab Results  Component Value Date   CHOL 134 10/02/2013   HDL 49.20 10/02/2013   LDLCALC 50 10/02/2013   LDLDIRECT 71.6 06/24/2010   TRIG 174.0* 10/02/2013   CHOLHDL 3 10/02/2013         Assessment & Plan:  Anemia: worse, uncertain etiology DM: well-controlled Dyslipidemia: well-controlled  Patient is advised the following: Patient Instructions  Please come back for a regular physical appointment in 6 months. check your blood sugar once a day.  vary the time of day when you check, between before the 3 meals, and at bedtime.  also check if you have symptoms of your blood sugar being too high or too low.  please keep a record of the readings and bring it to your next appointment here.  You can write it on any piece of paper.  please call us sooner if your blood sugar goes below 70, or if you have a lot of readings over 200. blood tests are being requested for you today.  We'll contact you with results. here are some tests for blood in the bowels.  please follow the instructions, and return to the lab.

## 2014-03-05 NOTE — Patient Instructions (Addendum)
Please come back for a regular physical appointment in 6 months. check your blood sugar once a day.  vary the time of day when you check, between before the 3 meals, and at bedtime.  also check if you have symptoms of your blood sugar being too high or too low.  please keep a record of the readings and bring it to your next appointment here.  You can write it on any piece of paper.  please call us sooner if your blood sugar goes below 70, or if you have a lot of readings over 200. blood tests are being requested for you today.  We'll contact you with results. here are some tests for blood in the bowels.  please follow the instructions, and return to the lab.

## 2014-04-17 ENCOUNTER — Other Ambulatory Visit: Payer: Self-pay

## 2014-04-17 MED ORDER — METFORMIN HCL ER 500 MG PO TB24: ORAL_TABLET | ORAL | Status: DC

## 2014-04-17 MED ORDER — PRAVASTATIN SODIUM 40 MG PO TABS: ORAL_TABLET | ORAL | Status: DC

## 2014-04-17 MED ORDER — PIOGLITAZONE HCL 45 MG PO TABS: ORAL_TABLET | ORAL | Status: DC

## 2014-04-17 MED ORDER — METOPROLOL TARTRATE 25 MG PO TABS: ORAL_TABLET | ORAL | Status: DC

## 2014-05-22 ENCOUNTER — Other Ambulatory Visit: Payer: Self-pay | Admitting: Endocrinology

## 2014-06-21 ENCOUNTER — Other Ambulatory Visit: Payer: Self-pay | Admitting: Endocrinology

## 2014-06-21 ENCOUNTER — Other Ambulatory Visit: Payer: Self-pay | Admitting: Internal Medicine

## 2014-07-28 ENCOUNTER — Other Ambulatory Visit: Payer: Self-pay | Admitting: Internal Medicine

## 2014-07-28 ENCOUNTER — Other Ambulatory Visit: Payer: Self-pay | Admitting: Endocrinology

## 2014-07-30 ENCOUNTER — Other Ambulatory Visit: Payer: Self-pay | Admitting: Endocrinology

## 2014-08-24 ENCOUNTER — Other Ambulatory Visit: Payer: Self-pay | Admitting: Endocrinology

## 2014-09-04 ENCOUNTER — Ambulatory Visit: Payer: Medicare Other | Admitting: Endocrinology

## 2014-09-29 ENCOUNTER — Ambulatory Visit (INDEPENDENT_AMBULATORY_CARE_PROVIDER_SITE_OTHER): Payer: Medicare Other | Admitting: Family Medicine

## 2014-09-29 VITALS — BP 142/68 | HR 94 | Temp 98.0°F | Resp 20 | Ht 66.0 in | Wt 257.2 lb

## 2014-09-29 DIAGNOSIS — J9801 Acute bronchospasm: Secondary | ICD-10-CM

## 2014-09-29 DIAGNOSIS — E119 Type 2 diabetes mellitus without complications: Secondary | ICD-10-CM

## 2014-09-29 DIAGNOSIS — J0100 Acute maxillary sinusitis, unspecified: Secondary | ICD-10-CM

## 2014-09-29 MED ORDER — ALBUTEROL SULFATE (2.5 MG/3ML) 0.083% IN NEBU
2.5000 mg | INHALATION_SOLUTION | Freq: Once | RESPIRATORY_TRACT | Status: AC
Start: 2014-09-29 — End: 2014-09-29
  Administered 2014-09-29: 2.5 mg via RESPIRATORY_TRACT

## 2014-09-29 MED ORDER — GUAIFENESIN-CODEINE 100-10 MG/5ML PO SOLN
5.0000 mL | ORAL | Status: DC | PRN
Start: 2014-09-29 — End: 2015-01-22

## 2014-09-29 MED ORDER — IPRATROPIUM BROMIDE 0.03 % NA SOLN: 2.0000 | Freq: Two times a day (BID) | NASAL | Status: DC

## 2014-09-29 MED ORDER — AMOXICILLIN-POT CLAVULANATE 875-125 MG PO TABS: 1.0000 | ORAL_TABLET | Freq: Two times a day (BID) | ORAL | Status: DC

## 2014-09-29 NOTE — Progress Notes (Signed)
Subjective:  This chart was scribed for Reginia Forts, MD by Dellis Filbert, ED Scribe at [facility].The patient was seen in exam room 04 and the patient's care was started at 3:31 PM.   Patient ID: Marie Rhodes, female    DOB: 25-Nov-1935, 79 y.o.   MRN: [id number]  09/29/2014  Cough; Sore Throat; Headache; and Facial Pain   HPI  HPI Comments: Marie Rhodes is a 79 y.o. female with a history of Chron's disease and DM who presents to [facility] complaining of a productive constant cough with green sputum, HA and facial pain due to sinus pressure, onset 5 days ago. She does have laryngitis, voice loss/change, sore throat, nasal congestion, wheezing, trouble sleeping and appetite change as associated symptoms. Pt does have diarrhea but this is baseline, she has noticed hyperactive bowel sounds. Pt has taken dayquil, nyquil, allegra, moonshine, johnny walker for relief. Pt notes codeine works well for her. She denies fever, chills, sweats, ear pain, SOB and vomiting.  Pt has not checked her blood sugar this week but states it has been fine in the past. Her Yolanda Bonine is getting married in next week and she is in charge of the rehearsal dinner. Last HgbA1c of 6.1.  Review of Systems  Constitutional: Positive for appetite change and fatigue. Negative for fever, chills and diaphoresis.  HENT: Positive for congestion, postnasal drip, rhinorrhea, sinus pressure, sore throat and voice change. Negative for ear discharge and ear pain.   Respiratory: Positive for cough and wheezing. Negative for shortness of breath.   Gastrointestinal: Positive for diarrhea. Negative for nausea and vomiting.  Neurological: Positive for headaches.  Psychiatric/Behavioral: Positive for sleep disturbance.    Past Medical History  Diagnosis Date  . Fatty liver   . DIABETES MELLITUS, TYPE II 04/27/2007  . CROHN'S DISEASE, SMALL INTESTINE 06/08/2008    ileum, cecum  . GOITER, MULTINODULAR 05/04/2008  .  ANXIETY 04/27/2007  . UNSPECIFIED ANEMIA 05/04/2008    Mild Anemia  . HYPERTENSION 04/27/2007  . OBESITY 08/01/2010  . DEPRESSION 04/27/2007  . Hyperlipemia   . GERD (gastroesophageal reflux disease)    Past Surgical History  Procedure Laterality Date  . Appendectomy    . Meckel diverticulum excision      Ileal and cecal resection   . Left oophorectomy  1992  . Biopsy thyroid    . Colonoscopy  08/10/2006    normal   No Known Allergies Current Outpatient Prescriptions  Medication Sig Dispense Refill  . ALPRAZolam (XANAX) 0.25 MG tablet TAKE AS NEEDED FOR       ANXIETY. 30 tablet 2  . Cyanocobalamin (VITAMIN B-12) 2500 MCG SUBL Place under the tongue. 1 by mouth in the morning     . folic acid (FOLVITE) 341 MCG tablet Take 400 mcg by mouth daily.      Marland Kitchen losartan-hydrochlorothiazide (HYZAAR) 100-25 MG per tablet TAKE 1 TABLET DAILY. 30 tablet 2  . metFORMIN (GLUCOPHAGE-XR) 500 MG 24 hr tablet TAKE (2) TABLETS TWICE DAILY. 120 tablet 1  . metoprolol tartrate (LOPRESSOR) 25 MG tablet TAKE (1/2) TABLET TWICE DAILY. 30 tablet 1  . Multiple Vitamin (MULTIVITAMIN) tablet Take 1 tablet by mouth daily.      . ONE TOUCH ULTRA TEST test strip CHECK BLOOD SUGAR ONCE A DAY. 50 each 2  . PENTASA 500 MG CR capsule TAKE (1) CAPSULE TWICE DAILY. 60 capsule 1  . pioglitazone (ACTOS) 45 MG tablet TAKE 1 TABLET EACH DAY. 30 tablet  1  . pravastatin (PRAVACHOL) 40 MG tablet TAKE (1) TABLET DAILY AT BEDTIME. 30 tablet 1  . ranitidine (ZANTAC 75) 75 MG tablet Take 1 tablet (75 mg total) by mouth daily. 30 tablet 1  . repaglinide (PRANDIN) 2 MG tablet TAKE 1 TABLET THREE TIMES DAILY BEFORE MEALS. 90 tablet 0  . amoxicillin-clavulanate (AUGMENTIN) 875-125 MG per tablet Take 1 tablet by mouth 2 (two) times daily. 20 tablet 0  . guaiFENesin-codeine 100-10 MG/5ML syrup Take 5 mLs by mouth every 4 (four) hours as needed for cough. 180 mL 0  . ipratropium (ATROVENT) 0.03 % nasal spray Place 2 sprays into the nose 2  (two) times daily. 30 mL 0   No current facility-administered medications for this visit.       Objective:    BP 142/68 mmHg  Pulse 94  Temp(Src) 98 F (36.7 C) (Oral)  Resp 20  Ht 5' 6"  (1.676 m)  Wt 257 lb 3.2 oz (116.665 kg)  BMI 41.53 kg/m2  SpO2 96% Physical Exam  Constitutional: She is oriented to person, place, and time. She appears well-developed and well-nourished. No distress.  HENT:  Head: Normocephalic and atraumatic.  Right Ear: External ear normal.  Left Ear: External ear normal.  Nose: Mucosal edema and rhinorrhea present. Right sinus exhibits no maxillary sinus tenderness and no frontal sinus tenderness. Left sinus exhibits no maxillary sinus tenderness and no frontal sinus tenderness.  Mouth/Throat: Oropharynx is clear and moist. No oropharyngeal exudate.  Eyes: Conjunctivae and EOM are normal. Pupils are equal, round, and reactive to light.  Neck: Normal range of motion. Neck supple.  Cardiovascular: Normal rate, regular rhythm and normal heart sounds.  Exam reveals no gallop and no friction rub.   No murmur heard. Pulmonary/Chest: Effort normal. She has wheezes in the right middle field, the left upper field, the left middle field and the left lower field. She has no rales.  Diffuse expiratory wheezes on the left.  Neurological: She is alert and oriented to person, place, and time. No cranial nerve deficit. She exhibits normal muscle tone. Coordination normal.  Skin: Skin is warm and dry. She is not diaphoretic.  Psychiatric: She has a normal mood and affect. Her behavior is normal.  Nursing note and vitals reviewed.  ALBUTEROL NEBULIZER ADMINISTERED IN OFFICE.    Assessment & Plan:   1. Acute maxillary sinusitis, recurrence not specified   2. Bronchospasm   3. Type 2 diabetes mellitus without complication      -New.   -Rx for Augmentin, Atrovent nasal spray, Robitussin AC PRN. -Start Mucinex DM bid.   -If no improvement in 72 hours, call.  Will  call in Prednisone taper and Albuterol HFA.    Meds ordered this encounter  Medications  . amoxicillin-clavulanate (AUGMENTIN) 875-125 MG per tablet    Sig: Take 1 tablet by mouth 2 (two) times daily.    Dispense:  20 tablet    Refill:  0  . ipratropium (ATROVENT) 0.03 % nasal spray    Sig: Place 2 sprays into the nose 2 (two) times daily.    Dispense:  30 mL    Refill:  0  . guaiFENesin-codeine 100-10 MG/5ML syrup    Sig: Take 5 mLs by mouth every 4 (four) hours as needed for cough.    Dispense:  180 mL    Refill:  0  . albuterol (PROVENTIL) (2.5 MG/3ML) 0.083% nebulizer solution 2.5 mg    Sig:     No Follow-up on file.  I personally performed the services described in this documentation, which was scribed in my presence. The recorded information has been reviewed and considered.   Charnette Younkin Elayne Guerin, M.D. [facility] [address] [place], [place]  [address] [phone] phone [phone] fax

## 2014-09-29 NOTE — Patient Instructions (Signed)
1. START MUCINEX DM ONE TABLET TWICE DAILY.   Sinusitis Sinusitis is redness, soreness, and inflammation of the paranasal sinuses. Paranasal sinuses are air pockets within the bones of your face (beneath the eyes, the middle of the forehead, or above the eyes). In healthy paranasal sinuses, mucus is able to drain out, and air is able to circulate through them by way of your nose. However, when your paranasal sinuses are inflamed, mucus and air can become trapped. This can allow bacteria and other germs to grow and cause infection. Sinusitis can develop quickly and last only a short time (acute) or continue over a long period (chronic). Sinusitis that lasts for more than 12 weeks is considered chronic.  CAUSES  Causes of sinusitis include:  Allergies.  Structural abnormalities, such as displacement of the cartilage that separates your nostrils (deviated septum), which can decrease the air flow through your nose and sinuses and affect sinus drainage.  Functional abnormalities, such as when the small hairs (cilia) that line your sinuses and help remove mucus do not work properly or are not present. SIGNS AND SYMPTOMS  Symptoms of acute and chronic sinusitis are the same. The primary symptoms are pain and pressure around the affected sinuses. Other symptoms include:  Upper toothache.  Earache.  Headache.  Bad breath.  Decreased sense of smell and taste.  A cough, which worsens when you are lying flat.  Fatigue.  Fever.  Thick drainage from your nose, which often is green and may contain pus (purulent).  Swelling and warmth over the affected sinuses. DIAGNOSIS  Your health care provider will perform a physical exam. During the exam, your health care provider may:  Look in your nose for signs of abnormal growths in your nostrils (nasal polyps).  Tap over the affected sinus to check for signs of infection.  View the inside of your sinuses (endoscopy) using an imaging device that  has a light attached (endoscope). If your health care provider suspects that you have chronic sinusitis, one or more of the following tests may be recommended:  Allergy tests.  Nasal culture. A sample of mucus is taken from your nose, sent to a lab, and screened for bacteria.  Nasal cytology. A sample of mucus is taken from your nose and examined by your health care provider to determine if your sinusitis is related to an allergy. TREATMENT  Most cases of acute sinusitis are related to a viral infection and will resolve on their own within 10 days. Sometimes medicines are prescribed to help relieve symptoms (pain medicine, decongestants, nasal steroid sprays, or saline sprays).  However, for sinusitis related to a bacterial infection, your health care provider will prescribe antibiotic medicines. These are medicines that will help kill the bacteria causing the infection.  Rarely, sinusitis is caused by a fungal infection. In theses cases, your health care provider will prescribe antifungal medicine. For some cases of chronic sinusitis, surgery is needed. Generally, these are cases in which sinusitis recurs more than 3 times per year, despite other treatments. HOME CARE INSTRUCTIONS   Drink plenty of water. Water helps thin the mucus so your sinuses can drain more easily.  Use a humidifier.  Inhale steam 3 to 4 times a day (for example, sit in the bathroom with the shower running).  Apply a warm, moist washcloth to your face 3 to 4 times a day, or as directed by your health care provider.  Use saline nasal sprays to help moisten and clean your sinuses.  Take  medicines only as directed by your health care provider.  If you were prescribed either an antibiotic or antifungal medicine, finish it all even if you start to feel better. SEEK IMMEDIATE MEDICAL CARE IF:  You have increasing pain or severe headaches.  You have nausea, vomiting, or drowsiness.  You have swelling around your  face.  You have vision problems.  You have a stiff neck.  You have difficulty breathing. MAKE SURE YOU:   Understand these instructions.  Will watch your condition.  Will get help right away if you are not doing well or get worse. Document Released: 09/14/2005 Document Revised: 01/29/2014 Document Reviewed: 09/29/2011 [facility] Patient Information 2015 [place], [place]. This information is not intended to replace advice given to you by your health care provider. Make sure you discuss any questions you have with your health care provider.

## 2014-10-05 ENCOUNTER — Other Ambulatory Visit: Payer: Self-pay | Admitting: Endocrinology

## 2014-10-05 ENCOUNTER — Other Ambulatory Visit: Payer: Self-pay | Admitting: Internal Medicine

## 2014-11-07 ENCOUNTER — Other Ambulatory Visit: Payer: Self-pay | Admitting: Endocrinology

## 2014-11-30 ENCOUNTER — Other Ambulatory Visit: Payer: Self-pay | Admitting: Endocrinology

## 2014-12-11 ENCOUNTER — Other Ambulatory Visit: Payer: Self-pay | Admitting: Internal Medicine

## 2015-01-15 ENCOUNTER — Other Ambulatory Visit: Payer: Self-pay

## 2015-01-15 ENCOUNTER — Other Ambulatory Visit (INDEPENDENT_AMBULATORY_CARE_PROVIDER_SITE_OTHER): Payer: Medicare Other

## 2015-01-15 DIAGNOSIS — I10 Essential (primary) hypertension: Secondary | ICD-10-CM | POA: Diagnosis not present

## 2015-01-15 DIAGNOSIS — Z0000 Encounter for general adult medical examination without abnormal findings: Secondary | ICD-10-CM | POA: Diagnosis not present

## 2015-01-15 DIAGNOSIS — Z79899 Other long term (current) drug therapy: Secondary | ICD-10-CM | POA: Diagnosis not present

## 2015-01-15 LAB — URINALYSIS, ROUTINE W REFLEX MICROSCOPIC
Bilirubin Urine: NEGATIVE
Hgb urine dipstick: NEGATIVE
LEUKOCYTES UA: NEGATIVE
Nitrite: NEGATIVE
RBC / HPF: NONE SEEN (ref 0–?)
Specific Gravity, Urine: >=1.03 — AB (ref 1.000–1.030)
Urine Glucose: NEGATIVE
Urobilinogen, UA: 0.2 (ref 0.0–1.0)
pH: 5.5 (ref 5.0–8.0)

## 2015-01-15 LAB — CBC WITH DIFFERENTIAL/PLATELET
BASOS ABS: 0 10*3/uL (ref 0.0–0.1)
Basophils Relative: 0.3 % (ref 0.0–3.0)
Eosinophils Absolute: 0.3 10*3/uL (ref 0.0–0.7)
Eosinophils Relative: 4.5 % (ref 0.0–5.0)
HCT: 33.9 % — ABNORMAL LOW (ref 36.0–46.0)
Hemoglobin: 11.4 g/dL — ABNORMAL LOW (ref 12.0–15.0)
Lymphocytes Relative: 32.4 % (ref 12.0–46.0)
Lymphs Abs: 2.2 10*3/uL (ref 0.7–4.0)
MCHC: 33.7 g/dL (ref 30.0–36.0)
MCV: 87.9 fl (ref 78.0–100.0)
MONO ABS: 0.4 10*3/uL (ref 0.1–1.0)
Monocytes Relative: 6.3 % (ref 3.0–12.0)
NEUTROS ABS: 3.8 10*3/uL (ref 1.4–7.7)
NEUTROS PCT: 56.5 % (ref 43.0–77.0)
Platelets: 246 10*3/uL (ref 150.0–400.0)
RBC: 3.85 Mil/uL — AB (ref 3.87–5.11)
RDW: 14.1 % (ref 11.5–15.5)
WBC: 6.8 10*3/uL (ref 4.0–10.5)

## 2015-01-15 LAB — HEPATIC FUNCTION PANEL
ALT: 21 U/L (ref 0–35)
AST: 34 U/L (ref 0–37)
Albumin: 4.1 g/dL (ref 3.5–5.2)
Alkaline Phosphatase: 90 U/L (ref 39–117)
BILIRUBIN DIRECT: 0.1 mg/dL (ref 0.0–0.3)
Total Bilirubin: 0.4 mg/dL (ref 0.2–1.2)
Total Protein: 7.1 g/dL (ref 6.0–8.3)

## 2015-01-15 LAB — BASIC METABOLIC PANEL
BUN: 25 mg/dL — ABNORMAL HIGH (ref 6–23)
CALCIUM: 10.1 mg/dL (ref 8.4–10.5)
CHLORIDE: 106 meq/L (ref 96–112)
CO2: 28 mEq/L (ref 19–32)
Creatinine, Ser: 1.42 mg/dL — ABNORMAL HIGH (ref 0.40–1.20)
GFR: 38.16 mL/min — ABNORMAL LOW (ref >60.00)
Glucose, Bld: 118 mg/dL — ABNORMAL HIGH (ref 70–99)
Potassium: 4.3 mEq/L (ref 3.5–5.1)
Sodium: 139 mEq/L (ref 135–145)

## 2015-01-15 LAB — TSH: TSH: 2.99 u[IU]/mL (ref 0.35–4.50)

## 2015-01-15 LAB — MICROALBUMIN / CREATININE URINE RATIO
Creatinine,U: 447.5 mg/dL
Microalb Creat Ratio: 1.3 mg/g (ref 0.0–30.0)
Microalb, Ur: 6 mg/dL — ABNORMAL HIGH (ref 0.0–1.9)

## 2015-01-15 LAB — HEMOGLOBIN A1C: Hgb A1c MFr Bld: 6.5 % (ref 4.6–6.5)

## 2015-01-15 MED ORDER — REPAGLINIDE 2 MG PO TABS: ORAL_TABLET | ORAL | Status: DC

## 2015-01-15 MED ORDER — METOPROLOL TARTRATE 25 MG PO TABS: ORAL_TABLET | ORAL | Status: DC

## 2015-01-15 MED ORDER — METFORMIN HCL ER 500 MG PO TB24: ORAL_TABLET | ORAL | Status: DC

## 2015-01-15 MED ORDER — PRAVASTATIN SODIUM 40 MG PO TABS: ORAL_TABLET | ORAL | Status: DC

## 2015-01-15 MED ORDER — LOSARTAN POTASSIUM-HCTZ 100-25 MG PO TABS: ORAL_TABLET | ORAL | Status: DC

## 2015-01-15 MED ORDER — PIOGLITAZONE HCL 45 MG PO TABS: ORAL_TABLET | ORAL | Status: DC

## 2015-01-15 NOTE — Addendum Note (Signed)
Addended by: Loney Loh on: 01/15/2015 09:00 AM   Modules accepted: Orders

## 2015-01-16 ENCOUNTER — Telehealth: Payer: Self-pay

## 2015-01-16 MED ORDER — ALPRAZOLAM 0.25 MG PO TABS: ORAL_TABLET | ORAL | Status: DC

## 2015-01-16 NOTE — Telephone Encounter (Signed)
i printed 

## 2015-01-16 NOTE — Telephone Encounter (Signed)
Pt is requesting a refill on her Alprazolam .62m. Pt's prescription was last filled on 03/05/2014. Pt has a appointment scheduled for 01/22/2015.

## 2015-01-16 NOTE — Telephone Encounter (Signed)
Rx faxed to pharmacy  

## 2015-01-22 ENCOUNTER — Ambulatory Visit (INDEPENDENT_AMBULATORY_CARE_PROVIDER_SITE_OTHER): Payer: Medicare Other | Admitting: Endocrinology

## 2015-01-22 ENCOUNTER — Encounter: Payer: Self-pay | Admitting: Endocrinology

## 2015-01-22 VITALS — BP 110/54 | HR 93 | Temp 98.1°F | Ht 66.0 in | Wt 261.0 lb

## 2015-01-22 DIAGNOSIS — Z0000 Encounter for general adult medical examination without abnormal findings: Secondary | ICD-10-CM | POA: Diagnosis not present

## 2015-01-22 DIAGNOSIS — M2550 Pain in unspecified joint: Secondary | ICD-10-CM

## 2015-01-22 DIAGNOSIS — D509 Iron deficiency anemia, unspecified: Secondary | ICD-10-CM

## 2015-01-22 DIAGNOSIS — I10 Essential (primary) hypertension: Secondary | ICD-10-CM | POA: Diagnosis not present

## 2015-01-22 DIAGNOSIS — E119 Type 2 diabetes mellitus without complications: Secondary | ICD-10-CM | POA: Diagnosis not present

## 2015-01-22 DIAGNOSIS — K7581 Nonalcoholic steatohepatitis (NASH): Secondary | ICD-10-CM | POA: Diagnosis not present

## 2015-01-22 DIAGNOSIS — D649 Anemia, unspecified: Secondary | ICD-10-CM

## 2015-01-22 DIAGNOSIS — E7800 Pure hypercholesterolemia, unspecified: Secondary | ICD-10-CM

## 2015-01-22 DIAGNOSIS — N289 Disorder of kidney and ureter, unspecified: Secondary | ICD-10-CM

## 2015-01-22 DIAGNOSIS — Z23 Encounter for immunization: Secondary | ICD-10-CM

## 2015-01-22 LAB — URIC ACID: Uric Acid, Serum: 7.1 mg/dL — ABNORMAL HIGH (ref 2.4–7.0)

## 2015-01-22 LAB — SEDIMENTATION RATE: Sed Rate: 41 mm/hr — ABNORMAL HIGH (ref 0–22)

## 2015-01-22 LAB — IBC PANEL
IRON: 89 ug/dL (ref 42–145)
Saturation Ratios: 16.1 % — ABNORMAL LOW (ref 20.0–50.0)
Transferrin: 394 mg/dL — ABNORMAL HIGH (ref 212.0–360.0)

## 2015-01-22 MED ORDER — PIOGLITAZONE HCL 30 MG PO TABS: 30.0000 mg | ORAL_TABLET | Freq: Every day | ORAL | Status: DC

## 2015-01-22 MED ORDER — METOPROLOL TARTRATE 25 MG PO TABS: 12.5000 mg | ORAL_TABLET | Freq: Every day | ORAL | Status: DC

## 2015-01-22 NOTE — Patient Instructions (Addendum)
please consider these measures for your health:  minimize alcohol.  do not use tobacco products.  have a colonoscopy at least every 10 years from age 77.  Women should have an annual mammogram from age 23.  keep firearms safely stored.  always use seat belts.  have working smoke alarms in your home.  see an eye doctor and dentist regularly.  never drive under the influence of alcohol or drugs (including prescription drugs).  those with fair skin should take precautions against the sun. it is critically important to prevent falling down (keep floor areas well-lit, dry, and free of loose objects.  If you have a cane, walker, or wheelchair, you should use it, even for short trips around the house.  Also, try not to rush).  blood tests are requested for you today.  We'll let you know about the results.  Please come back for a follow-up appointment in 6 months.  Please reduce the actos to 30 mg daily, and th metoprolol to 1/2 pill daily.

## 2015-01-22 NOTE — Progress Notes (Signed)
we discussed code status.  pt requests full code, but would not want to be started or maintained on artificial life-support measures if there was not a reasonable chance of recovery 

## 2015-01-22 NOTE — Progress Notes (Signed)
Subjective:    Patient ID: Marie Rhodes, female    DOB: May 27, 1936, 79 y.o.   MRN: [id number]  HPI Pt is here for regular wellness examination, and is feeling pretty well in general, and says chronic med probs are stable, except as noted below Past Medical History  Diagnosis Date  . Fatty liver   . DIABETES MELLITUS, TYPE II 04/27/2007  . CROHN'S DISEASE, SMALL INTESTINE 06/08/2008    ileum, cecum  . GOITER, MULTINODULAR 05/04/2008  . ANXIETY 04/27/2007  . UNSPECIFIED ANEMIA 05/04/2008    Mild Anemia  . HYPERTENSION 04/27/2007  . OBESITY 08/01/2010  . DEPRESSION 04/27/2007  . Hyperlipemia   . GERD (gastroesophageal reflux disease)     Past Surgical History  Procedure Laterality Date  . Appendectomy    . Meckel diverticulum excision      Ileal and cecal resection   . Left oophorectomy  1992  . Biopsy thyroid    . Colonoscopy  08/10/2006    normal    History   Social History  . Marital Status: Widowed    Spouse Name: N/A  . Number of Children: N/A  . Years of Education: N/A   Occupational History  . 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 Smoking status: Former Research scientist (life sciences)  . Smokeless tobacco: Never Used  . Alcohol Use: No  . Drug Use: No  . Sexual Activity: Not on file   Other Topics Concern  . Not on file   Social History Narrative   Pt does get regular exercise - walking at gym   Former Smoker (college only)    Current Outpatient Prescriptions on File Prior to Visit  Medication Sig Dispense Refill  . ALPRAZolam (XANAX) 0.25 MG tablet TAKE AS NEEDED FOR       ANXIETY. 30 tablet 2  . Cyanocobalamin (VITAMIN B-12) 2500 MCG SUBL Place under the tongue. 1 by mouth in the morning     . folic acid (FOLVITE) 409 MCG tablet Take 400 mcg by mouth daily.      Marland Kitchen losartan-hydrochlorothiazide (HYZAAR) 100-25 MG per tablet Take 1 tablet daily. 30 tablet 0  . metFORMIN (GLUCOPHAGE-XR) 500 MG 24 hr tablet TAKE (2) TABLETS TWICE DAILY. 120 tablet  0  . Multiple Vitamin (MULTIVITAMIN) tablet Take 1 tablet by mouth daily.      . ONE TOUCH ULTRA TEST test strip CHECK BLOOD SUGAR ONCE A DAY. 50 each 2  . PENTASA 500 MG CR capsule TAKE (1) CAPSULE TWICE DAILY. 60 capsule 5  . pravastatin (PRAVACHOL) 40 MG tablet TAKE (1) TABLET DAILY AT BEDTIME. 30 tablet 0  . ranitidine (ZANTAC 75) 75 MG tablet Take 1 tablet (75 mg total) by mouth daily. 30 tablet 1  . repaglinide (PRANDIN) 2 MG tablet TAKE 1 TABLET THREE TIMES DAILY BEFORE MEALS. 90 tablet 0   No current facility-administered medications on file prior to visit.    No Known Allergies  Family History  Problem Relation Age of Onset  . Breast cancer Mother 57  . Kidney disease Mother   . Cancer Mother   . Diabetes Mother   . Skin cancer Father   . Cancer Father   . Diabetes Father   . Colon cancer Other     uncle  . Diabetes Other   . Cancer Sister   . Cancer Brother   . Diabetes Brother   . Stroke Maternal Grandmother   . Heart disease Paternal  Grandmother     BP 110/54 mmHg  Pulse 93  Temp(Src) 98.1 F (36.7 C) (Oral)  Ht 5' 6"  (1.676 m)  Wt 261 lb (118.389 kg)  BMI 42.15 kg/m2  SpO2 93%   Review of Systems  Constitutional: Negative for fever.  HENT: Negative for hearing loss.   Eyes: Negative for visual disturbance.  Respiratory: Negative for shortness of breath.   Cardiovascular: Negative for chest pain.  Gastrointestinal: Negative for anal bleeding.  Endocrine: Negative for cold intolerance.  Genitourinary: Negative for hematuria.  Musculoskeletal: Positive for back pain.  Skin: Negative for rash.  Allergic/Immunologic: Negative for environmental allergies.  Neurological: Negative for numbness.  Hematological: Does not bruise/bleed easily.  Psychiatric/Behavioral: Negative for dysphoric mood.       Objective:   Physical Exam VS: see vs page GEN: no distress HEAD: head: no deformity eyes: no periorbital swelling, no proptosis external nose and  ears are normal mouth: no lesion seen NECK: small multinodular goiter. CHEST WALL: no deformity LUNGS:  Clear to auscultation BREASTS: sees gyn CV: reg rate and rhythm, no murmur ABD: abdomen is soft, nontender.  no hepatosplenomegaly.  not distended.  no hernia GENITALIA/RECTAL: sees gyn MUSCULOSKELETAL: muscle bulk and strength are grossly normal.  no obvious joint swelling.  gait is normal and steady. EXTEMITIES: no deformity.  no ulcer on the feet.  1+ bilat leg edema PULSES: dorsalis pedis intact bilat.  no carotid bruit NEURO:  cn 2-12 grossly intact.   readily moves all 4's.  sensation is intact to touch on the feet SKIN:  Normal texture and temperature.  No rash or suspicious lesion is visible.   NODES:  None palpable at the neck PSYCH: alert, well-oriented.  Does not appear anxious nor depressed.   i personally reviewed electrocardiogram tracing: RBBB (no change)     Assessment & Plan:  Wellness visit today, with problems stable, except as noted.    SEPARATE EVALUATION FOLLOWS--EACH PROBLEM HERE IS NEW, NOT RESPONDING TO TREATMENT, OR POSES SIGNIFICANT RISK TO THE PATIENT'S HEALTH: HISTORY OF THE PRESENT ILLNESS: Pt states few mos of intermittent pain at the right great toe MTP, but no assoc fever PAST MEDICAL HISTORY reviewed and up to date today REVIEW OF SYSTEMS: She has gained weight PHYSICAL EXAMINATION: VITAL SIGNS:  See vs page GENERAL: no distress Right great toe MTP: slightly swollen and tender LAB/XRAY RESULTS: Uric acid=elevated IMPRESSION: Gouty arthritis, new. PLAN:  i have sent a prescription to your pharmacy, for allopurinol

## 2015-01-23 DIAGNOSIS — D509 Iron deficiency anemia, unspecified: Secondary | ICD-10-CM

## 2015-01-23 MED ORDER — ALLOPURINOL 100 MG PO TABS: 100.0000 mg | ORAL_TABLET | Freq: Every day | ORAL | Status: DC

## 2015-02-18 ENCOUNTER — Other Ambulatory Visit: Payer: Self-pay | Admitting: Endocrinology

## 2015-04-29 ENCOUNTER — Other Ambulatory Visit: Payer: Self-pay | Admitting: Endocrinology

## 2015-05-27 ENCOUNTER — Other Ambulatory Visit: Payer: Self-pay | Admitting: Endocrinology

## 2015-07-02 ENCOUNTER — Other Ambulatory Visit: Payer: Self-pay | Admitting: Internal Medicine

## 2015-07-02 ENCOUNTER — Other Ambulatory Visit: Payer: Self-pay | Admitting: Endocrinology

## 2015-07-23 ENCOUNTER — Telehealth: Payer: Self-pay | Admitting: Endocrinology

## 2015-07-23 ENCOUNTER — Encounter: Payer: Self-pay | Admitting: Endocrinology

## 2015-07-23 ENCOUNTER — Ambulatory Visit (INDEPENDENT_AMBULATORY_CARE_PROVIDER_SITE_OTHER): Payer: Medicare Other | Admitting: Endocrinology

## 2015-07-23 VITALS — BP 114/56 | HR 89 | Temp 98.2°F | Ht 66.0 in | Wt 255.0 lb

## 2015-07-23 DIAGNOSIS — E7800 Pure hypercholesterolemia, unspecified: Secondary | ICD-10-CM

## 2015-07-23 DIAGNOSIS — N289 Disorder of kidney and ureter, unspecified: Secondary | ICD-10-CM

## 2015-07-23 DIAGNOSIS — K7581 Nonalcoholic steatohepatitis (NASH): Secondary | ICD-10-CM

## 2015-07-23 DIAGNOSIS — E119 Type 2 diabetes mellitus without complications: Secondary | ICD-10-CM | POA: Diagnosis not present

## 2015-07-23 DIAGNOSIS — D649 Anemia, unspecified: Secondary | ICD-10-CM | POA: Diagnosis not present

## 2015-07-23 LAB — CBC WITH DIFFERENTIAL/PLATELET
Basophils Absolute: 0 10*3/uL (ref 0.0–0.1)
Basophils Relative: 0.3 % (ref 0.0–3.0)
EOS ABS: 0.4 10*3/uL (ref 0.0–0.7)
EOS PCT: 4.3 % (ref 0.0–5.0)
HCT: 36.1 % (ref 36.0–46.0)
Hemoglobin: 11.8 g/dL — ABNORMAL LOW (ref 12.0–15.0)
LYMPHS ABS: 2.2 10*3/uL (ref 0.7–4.0)
Lymphocytes Relative: 27.2 % (ref 12.0–46.0)
MCHC: 32.7 g/dL (ref 30.0–36.0)
MCV: 90.8 fl (ref 78.0–100.0)
MONO ABS: 0.5 10*3/uL (ref 0.1–1.0)
Monocytes Relative: 6.4 % (ref 3.0–12.0)
NEUTROS ABS: 5.1 10*3/uL (ref 1.4–7.7)
NEUTROS PCT: 61.8 % (ref 43.0–77.0)
PLATELETS: 251 10*3/uL (ref 150.0–400.0)
RBC: 3.98 Mil/uL (ref 3.87–5.11)
RDW: 13.8 % (ref 11.5–15.5)
WBC: 8.2 10*3/uL (ref 4.0–10.5)

## 2015-07-23 LAB — LIPID PANEL
CHOLESTEROL: 138 mg/dL (ref 0–200)
HDL: 52 mg/dL (ref >39.00)
LDL CALC: 52 mg/dL (ref 0–99)
NonHDL: 85.57
TRIGLYCERIDES: 166 mg/dL — AB (ref 0.0–149.0)
Total CHOL/HDL Ratio: 3
VLDL: 33.2 mg/dL (ref 0.0–40.0)

## 2015-07-23 LAB — IBC PANEL
Iron: 87 ug/dL (ref 42–145)
Saturation Ratios: 15.8 % — ABNORMAL LOW (ref 20.0–50.0)
TRANSFERRIN: 393 mg/dL — AB (ref 212.0–360.0)

## 2015-07-23 LAB — POCT GLYCOSYLATED HEMOGLOBIN (HGB A1C): HEMOGLOBIN A1C: 5.8

## 2015-07-23 LAB — HEPATIC FUNCTION PANEL
ALBUMIN: 4.1 g/dL (ref 3.5–5.2)
ALT: 20 U/L (ref 0–35)
AST: 20 U/L (ref 0–37)
Alkaline Phosphatase: 93 U/L (ref 39–117)
Bilirubin, Direct: 0.1 mg/dL (ref 0.0–0.3)
TOTAL PROTEIN: 7 g/dL (ref 6.0–8.3)
Total Bilirubin: 0.4 mg/dL (ref 0.2–1.2)

## 2015-07-23 LAB — VITAMIN B12: Vitamin B-12: 1192 pg/mL — ABNORMAL HIGH (ref 211–911)

## 2015-07-23 LAB — BASIC METABOLIC PANEL
BUN: 36 mg/dL — ABNORMAL HIGH (ref 6–23)
CO2: 31 mEq/L (ref 19–32)
CREATININE: 1.37 mg/dL — AB (ref 0.40–1.20)
Calcium: 10.5 mg/dL (ref 8.4–10.5)
Chloride: 104 mEq/L (ref 96–112)
GFR: 39.72 mL/min — AB (ref >60.00)
Glucose, Bld: 73 mg/dL (ref 70–99)
POTASSIUM: 4.5 meq/L (ref 3.5–5.1)
Sodium: 141 mEq/L (ref 135–145)

## 2015-07-23 MED ORDER — PRAVASTATIN SODIUM 40 MG PO TABS: ORAL_TABLET | ORAL | Status: DC

## 2015-07-23 MED ORDER — REPAGLINIDE 0.5 MG PO TABS: 0.5000 mg | ORAL_TABLET | Freq: Two times a day (BID) | ORAL | Status: DC

## 2015-07-23 NOTE — Patient Instructions (Addendum)
blood tests are requested for you today.  We'll let you know about the results.  We'll reduce the pravastatin if we can.  Please come back for a regular physical appointment in 6 months (must be after 01/22/16) Please stop taking the metoprolol.  Please reduce the repaglinide to twice a day (just before the first and last meals of the day).

## 2015-07-23 NOTE — Telephone Encounter (Signed)
Correction: Kidneys are the same Cholesterol is good. You can stay off the pravastatin

## 2015-07-23 NOTE — Telephone Encounter (Signed)
please call patient: Sorry, the mychart message was an error.   You have mild anemia.  Please send hemoccults.   Cholesterol is good.  You can stay off the pravastatin.

## 2015-07-23 NOTE — Progress Notes (Signed)
   Subjective:    Patient ID: Marie Rhodes, female    DOB: Oct 02, 1935, 79 y.o.   MRN: [id number]  HPI Pt returns for f/u of diabetes mellitus: DM type: 2 Dx'ed: 7711 Complications: renal insufficiency Therapy: 3 oral meds GDM: never DKA: never Severe hypoglycemia: never Pancreatitis: never Other:she has never taken insulin  Interval history: pt states she feels well in general.   She takes fe 1/day She wants to d/c pravastatin and/ot metoprolol if possible Review of Systems No weight change    Objective:   Physical Exam VITAL SIGNS:  See vs page GENERAL: no distress Pulses: dorsalis pedis intact bilat.   MSK: no deformity of the feet CV: no leg edema Skin:  no ulcer on the feet.  normal color and temp on the feet. Neuro: sensation is intact to touch on the feet.     A1c=5.8% Lab Results  Component Value Date   WBC 8.2 07/23/2015   HGB 11.8* 07/23/2015   HCT 36.1 07/23/2015   MCV 90.8 07/23/2015   PLT 251.0 07/23/2015   Lab Results  Component Value Date   CHOL 138 07/23/2015   HDL 52.00 07/23/2015   LDLCALC 52 07/23/2015   LDLDIRECT 71.6 06/24/2010   TRIG 166.0* 07/23/2015   CHOLHDL 3 07/23/2015      Assessment & Plan:  Dyslipidemia: well-controlled.  i told pt she can have a trial off pravastatin. DM: well-controlled.   HTN: well-controlled to overcontrolled.  Patient is advised the following: Patient Instructions  blood tests are requested for you today.  We'll let you know about the results.  We'll reduce the pravastatin if we can.  Please come back for a regular physical appointment in 6 months (must be after 01/22/16) Please stop taking the metoprolol.  Please reduce the repaglinide to twice a day (just before the first and last meals of the day).

## 2015-07-24 ENCOUNTER — Telehealth: Payer: Self-pay

## 2015-07-24 NOTE — Telephone Encounter (Signed)
I contacted the pt and advised of note below. Pt voiced understanding.  

## 2015-07-24 NOTE — Telephone Encounter (Signed)
Error

## 2015-07-31 ENCOUNTER — Other Ambulatory Visit: Payer: Self-pay | Admitting: Endocrinology

## 2015-07-31 ENCOUNTER — Other Ambulatory Visit: Payer: Self-pay | Admitting: Internal Medicine

## 2015-08-27 ENCOUNTER — Other Ambulatory Visit: Payer: Self-pay | Admitting: Endocrinology

## 2015-08-27 ENCOUNTER — Other Ambulatory Visit: Payer: Self-pay | Admitting: Internal Medicine

## 2015-09-05 ENCOUNTER — Other Ambulatory Visit: Payer: Self-pay | Admitting: Internal Medicine

## 2015-10-03 ENCOUNTER — Other Ambulatory Visit: Payer: Self-pay | Admitting: Endocrinology

## 2015-10-18 ENCOUNTER — Telehealth: Payer: Self-pay | Admitting: Internal Medicine

## 2015-10-18 MED ORDER — MESALAMINE ER 500 MG PO CPCR: ORAL_CAPSULE | ORAL | Status: DC

## 2015-10-18 NOTE — Telephone Encounter (Signed)
Refill sent in as requested, appt made for Feb.

## 2015-11-08 ENCOUNTER — Other Ambulatory Visit: Payer: Self-pay | Admitting: Endocrinology

## 2015-11-11 ENCOUNTER — Encounter: Payer: Self-pay | Admitting: Internal Medicine

## 2015-11-11 ENCOUNTER — Other Ambulatory Visit (INDEPENDENT_AMBULATORY_CARE_PROVIDER_SITE_OTHER): Payer: Medicare Other

## 2015-11-11 ENCOUNTER — Ambulatory Visit (INDEPENDENT_AMBULATORY_CARE_PROVIDER_SITE_OTHER): Payer: Medicare Other | Admitting: Internal Medicine

## 2015-11-11 VITALS — BP 110/68 | HR 100 | Ht 66.0 in | Wt 264.0 lb

## 2015-11-11 DIAGNOSIS — K5000 Crohn's disease of small intestine without complications: Secondary | ICD-10-CM

## 2015-11-11 DIAGNOSIS — D509 Iron deficiency anemia, unspecified: Secondary | ICD-10-CM

## 2015-11-11 LAB — CBC WITH DIFFERENTIAL/PLATELET
BASOS PCT: 0.3 % (ref 0.0–3.0)
Basophils Absolute: 0 10*3/uL (ref 0.0–0.1)
EOS ABS: 0.3 10*3/uL (ref 0.0–0.7)
Eosinophils Relative: 3.9 % (ref 0.0–5.0)
HEMATOCRIT: 36.9 % (ref 36.0–46.0)
Hemoglobin: 12.3 g/dL (ref 12.0–15.0)
LYMPHS PCT: 29.3 % (ref 12.0–46.0)
Lymphs Abs: 2.2 10*3/uL (ref 0.7–4.0)
MCHC: 33.4 g/dL (ref 30.0–36.0)
MCV: 89.5 fl (ref 78.0–100.0)
Monocytes Absolute: 0.5 10*3/uL (ref 0.1–1.0)
Monocytes Relative: 6.4 % (ref 3.0–12.0)
NEUTROS ABS: 4.6 10*3/uL (ref 1.4–7.7)
Neutrophils Relative %: 60.1 % (ref 43.0–77.0)
PLATELETS: 255 10*3/uL (ref 150.0–400.0)
RBC: 4.12 Mil/uL (ref 3.87–5.11)
RDW: 13.4 % (ref 11.5–15.5)
WBC: 7.7 10*3/uL (ref 4.0–10.5)

## 2015-11-11 LAB — FERRITIN: Ferritin: 33.8 ng/mL (ref 10.0–291.0)

## 2015-11-11 MED ORDER — MESALAMINE ER 500 MG PO CPCR: ORAL_CAPSULE | ORAL | Status: DC

## 2015-11-11 NOTE — Patient Instructions (Addendum)
  Your physician has requested that you go to the basement for the following lab work before leaving today: CBC/diff, Ferritin  We have sent the following medications to your pharmacy for you to pick up at your convenience: Pentasa  You have been scheduled for a colonoscopy. Please follow written instructions given to you at your visit today.  Please pick up your over the counter prep supplies at the pharmacy. If you use inhalers (even only as needed), please bring them with you on the day of your procedure.  I appreciate the opportunity to care for you. Silvano Rusk, MD, [facility]

## 2015-11-11 NOTE — Progress Notes (Signed)
   Subjective:    Patient ID: Marie Rhodes, female    DOB: Aug 05, 1936, 80 y.o.   MRN: [id number] Cc: Crohn's follow-up HPI  The patient is an elderly white woman with a history of Crohn's disease and resection for small bowel Crohn's disease. She does well with occasional loose stools on low-dose Pentasa and would like a refill. She has a history of iron deficiency anemia and this was rediscovered or shown still be present last fall and she was started back on ferrous sulfate by her primary care provider. She denies any bleeding. She has some fatigue issues and thinks that her obesity has something to do with that but otherwise has no specific complaints today. Diabetes is under good control she reports. Medications, allergies, past medical history, past surgical history, family history and social history are reviewed and updated in the EMR.  Review of Systems As above    Objective:   Physical Exam @BP  110/68 mmHg  Pulse 100  Ht 5' 6"  (1.676 m)  Wt 264 lb (119.75 kg)  BMI 42.63 kg/m2@  General:  NAD obese Eyes:   anicteric Lungs:  clear Heart:: S1S2 no rubs, murmurs or gallops Abdomen:  Obese soft and nontender, BS+ Ext:   no edema, cyanosis or clubbing   Data Reviewed:  As per history of present illness. Lab Results  Component Value Date   WBC 8.2 07/23/2015   HGB 11.8* 07/23/2015   HCT 36.1 07/23/2015   MCV 90.8 07/23/2015   PLT 251.0 07/23/2015   last colonoscopy 2007 by Dr. Velora Heckler, unremarkable postoperative exam      Assessment & Plan:   1. Iron deficiency anemia   2. Crohn's disease of small intestine without complication ([place])    Schedule colonoscopy given the recurrent anemia and the Crohn's disease. Check CBC and a ferritin level today. The risks and benefits as well as alternatives of endoscopic procedure(s) have been discussed and reviewed. All questions answered. The patient agrees to proceed.

## 2015-11-13 NOTE — Progress Notes (Signed)
Quick Note:  Labs are ok - no anemia and iron improving See her at colonoscopy ______

## 2015-12-06 ENCOUNTER — Encounter (HOSPITAL_COMMUNITY): Payer: Self-pay | Admitting: Emergency Medicine

## 2015-12-06 ENCOUNTER — Emergency Department (HOSPITAL_COMMUNITY): Payer: Medicare Other

## 2015-12-06 ENCOUNTER — Emergency Department (HOSPITAL_COMMUNITY)
Admission: EM | Admit: 2015-12-06 | Discharge: 2015-12-06 | Disposition: A | Discharge status: Home / Self Care | Payer: Medicare Other | Attending: Emergency Medicine | Admitting: Emergency Medicine

## 2015-12-06 ENCOUNTER — Ambulatory Visit (INDEPENDENT_AMBULATORY_CARE_PROVIDER_SITE_OTHER): Payer: Medicare Other | Admitting: Family Medicine

## 2015-12-06 VITALS — BP 130/62 | HR 108 | Temp 100.3°F | Resp 16 | Ht 67.0 in | Wt 255.0 lb

## 2015-12-06 DIAGNOSIS — Z79899 Other long term (current) drug therapy: Secondary | ICD-10-CM | POA: Diagnosis not present

## 2015-12-06 DIAGNOSIS — Z87891 Personal history of nicotine dependence: Secondary | ICD-10-CM | POA: Diagnosis not present

## 2015-12-06 DIAGNOSIS — F419 Anxiety disorder, unspecified: Secondary | ICD-10-CM | POA: Insufficient documentation

## 2015-12-06 DIAGNOSIS — R9431 Abnormal electrocardiogram [ECG] [EKG]: Secondary | ICD-10-CM

## 2015-12-06 DIAGNOSIS — K219 Gastro-esophageal reflux disease without esophagitis: Secondary | ICD-10-CM | POA: Insufficient documentation

## 2015-12-06 DIAGNOSIS — I10 Essential (primary) hypertension: Secondary | ICD-10-CM | POA: Diagnosis not present

## 2015-12-06 DIAGNOSIS — R05 Cough: Secondary | ICD-10-CM | POA: Diagnosis not present

## 2015-12-06 DIAGNOSIS — F329 Major depressive disorder, single episode, unspecified: Secondary | ICD-10-CM | POA: Insufficient documentation

## 2015-12-06 DIAGNOSIS — Z7984 Long term (current) use of oral hypoglycemic drugs: Secondary | ICD-10-CM | POA: Diagnosis not present

## 2015-12-06 DIAGNOSIS — E669 Obesity, unspecified: Secondary | ICD-10-CM | POA: Insufficient documentation

## 2015-12-06 DIAGNOSIS — J111 Influenza due to unidentified influenza virus with other respiratory manifestations: Secondary | ICD-10-CM | POA: Insufficient documentation

## 2015-12-06 DIAGNOSIS — D649 Anemia, unspecified: Secondary | ICD-10-CM | POA: Diagnosis not present

## 2015-12-06 DIAGNOSIS — R000 Tachycardia, unspecified: Secondary | ICD-10-CM | POA: Insufficient documentation

## 2015-12-06 DIAGNOSIS — R059 Cough, unspecified: Secondary | ICD-10-CM

## 2015-12-06 DIAGNOSIS — E119 Type 2 diabetes mellitus without complications: Secondary | ICD-10-CM | POA: Insufficient documentation

## 2015-12-06 DIAGNOSIS — E785 Hyperlipidemia, unspecified: Secondary | ICD-10-CM | POA: Diagnosis not present

## 2015-12-06 DIAGNOSIS — R509 Fever, unspecified: Secondary | ICD-10-CM

## 2015-12-06 DIAGNOSIS — R079 Chest pain, unspecified: Secondary | ICD-10-CM

## 2015-12-06 LAB — CBC WITH DIFFERENTIAL/PLATELET
BASOS PCT: 0 %
Basophils Absolute: 0 10*3/uL (ref 0.0–0.1)
EOS ABS: 0.1 10*3/uL (ref 0.0–0.7)
EOS PCT: 1 %
HCT: 35.7 % — ABNORMAL LOW (ref 36.0–46.0)
Hemoglobin: 11.6 g/dL — ABNORMAL LOW (ref 12.0–15.0)
LYMPHS ABS: 1.7 10*3/uL (ref 0.7–4.0)
Lymphocytes Relative: 34 %
MCH: 28.8 pg (ref 26.0–34.0)
MCHC: 32.5 g/dL (ref 30.0–36.0)
MCV: 88.6 fL (ref 78.0–100.0)
Monocytes Absolute: 0.5 10*3/uL (ref 0.1–1.0)
Monocytes Relative: 11 %
Neutro Abs: 2.7 10*3/uL (ref 1.7–7.7)
Neutrophils Relative %: 54 %
PLATELETS: 177 10*3/uL (ref 150–400)
RBC: 4.03 MIL/uL (ref 3.87–5.11)
RDW: 12.8 % (ref 11.5–15.5)
WBC: 4.9 10*3/uL (ref 4.0–10.5)

## 2015-12-06 LAB — I-STAT CHEM 8, ED
BUN: 40 mg/dL — ABNORMAL HIGH (ref 6–20)
CALCIUM ION: 1.27 mmol/L (ref 1.13–1.30)
CHLORIDE: 98 mmol/L — AB (ref 101–111)
Creatinine, Ser: 1.6 mg/dL — ABNORMAL HIGH (ref 0.44–1.00)
Glucose, Bld: 77 mg/dL (ref 65–99)
HEMATOCRIT: 38 % (ref 36.0–46.0)
Hemoglobin: 12.9 g/dL (ref 12.0–15.0)
Potassium: 3.8 mmol/L (ref 3.5–5.1)
SODIUM: 136 mmol/L (ref 135–145)
TCO2: 24 mmol/L (ref 0–100)

## 2015-12-06 LAB — INFLUENZA PANEL BY PCR (TYPE A & B)
H1N1FLUPCR: NOT DETECTED
Influenza A By PCR: NEGATIVE
Influenza B By PCR: POSITIVE — AB

## 2015-12-06 LAB — I-STAT TROPONIN, ED: TROPONIN I, POC: 0.02 ng/mL (ref 0.00–0.08)

## 2015-12-06 NOTE — Progress Notes (Signed)
[facility] [address] [place] [address] [phone]  Date:  12/06/2015   Name:  Marie Rhodes   DOB:  12-09-1935   MRN:  [id number]  PCP:  Renato Shin, MD    Chief Complaint: Cough; chills; and feels like "elephant sitting on chest"   History of Present Illness:  This is a 80 y.o. female with PMH DM, anemia, NASH, crohn's, anxiety, depression, HTN who is presenting with cough x 6 days. Cough is productive of green mucus. Having chills x 3 days. Temp here 100.5 but had not been checking at home. This morning she woke to go to the bank around 11 am and started having substernal chest pain, described as pressure, "like an elephant sitting on my chest". The CP resolved after 10-15 minutes and she is asymptomatic currently. She denies assoc sob, diaphoresis, dizziness, blurred vision. No radiation of pain.  She states her endocrinologist told her 5-6 years ago there was evidence on EKG that she had a prior MI. No other heart problems. She had a nuclear stress test in 2007 that unable to be completed d/t symptoms. No other cardio work up. No fam hx CAD.  She does not take aspirin or nsaids d/t hx crohn's disease.  Pt lives alone.  Review of Systems:  Review of Systems See HPI  Patient Active Problem List   Diagnosis Date Noted  . Anemia, iron deficiency 01/23/2015  . Arthralgia 01/22/2015  . Diabetes ([place]) 03/05/2014  . UTI (urinary tract infection) 03/13/2013  . Abnormal ECG 09/12/2012  . Retinal detachment 09/12/2012  . Cough 06/28/2012  . Menopause 09/10/2011  . Renal insufficiency 12/22/2010  . OBESITY 08/01/2010  . HYPERCHOLESTEROLEMIA 06/24/2010  . NASH (nonalcoholic steatohepatitis) 06/23/2009  . CROHN'S DISEASE, SMALL INTESTINE 06/08/2008  . GOITER, MULTINODULAR 05/04/2008  . GERD 01/19/2008  . ANXIETY 04/27/2007  . DEPRESSION 04/27/2007  . Essential hypertension 04/27/2007    Prior to Admission medications   Medication Sig Start  Date End Date Taking? Authorizing Provider  ALPRAZolam (XANAX) 0.25 MG tablet TAKE AS NEEDED FOR       ANXIETY. 01/16/15  Yes Renato Shin, MD  Cyanocobalamin (VITAMIN B-12) 2500 MCG SUBL Place under the tongue. 1 by mouth in the morning    Yes Historical Provider, MD  ferrous sulfate 325 (65 FE) MG tablet Take 325 mg by mouth daily with breakfast.   Yes Historical Provider, MD  losartan-hydrochlorothiazide (HYZAAR) 100-25 MG tablet TAKE 1 TABLET DAILY. 11/08/15  Yes Renato Shin, MD  mesalamine (PENTASA) 500 MG CR capsule TAKE (1) CAPSULE TWICE DAILY. 11/11/15  Yes Gatha Mayer, MD  metFORMIN (GLUCOPHAGE-XR) 500 MG 24 hr tablet TAKE (2) TABLETS TWICE DAILY. 11/08/15  Yes Renato Shin, MD  Multiple Vitamin (MULTIVITAMIN) tablet Take 1 tablet by mouth daily.     Yes Historical Provider, MD  ONE TOUCH ULTRA TEST test strip CHECK BLOOD SUGAR ONCE A DAY.   Yes Renato Shin, MD  pioglitazone (ACTOS) 30 MG tablet Take 1 tablet (30 mg total) by mouth daily. 01/22/15  Yes Renato Shin, MD  ranitidine (ZANTAC 75) 75 MG tablet Take 1 tablet (75 mg total) by mouth daily. 12/18/13  Yes Jessica D Zehr, PA-C  repaglinide (PRANDIN) 0.5 MG tablet Take 1 tablet (0.5 mg total) by mouth 2 (two) times daily before a meal. 07/23/15  Yes Renato Shin, MD           No Known Allergies  Past Surgical History  Procedure  Laterality Date  . Appendectomy    . Meckel diverticulum excision      Ileal and cecal resection   . Left oophorectomy  1992  . Biopsy thyroid    . Colonoscopy  08/10/2006    normal    Social History  Substance Use Topics  . Smoking status: Former Research scientist (life sciences)  . Smokeless tobacco: Never Used  . Alcohol Use: No    Family History  Problem Relation Age of Onset  . Breast cancer Mother 5  . Kidney disease Mother   . Cancer Mother   . Diabetes Mother   . Skin cancer Father   . Cancer Father   . Diabetes Father   . Colon cancer Other     uncle  . Diabetes Other   . Cancer Sister   . Cancer  Brother   . Diabetes Brother   . Stroke Maternal Grandmother   . Heart disease Paternal Grandmother     Medication list has been reviewed and updated.  Physical Examination:  Physical Exam  Constitutional: She is oriented to person, place, and time. She appears well-developed and well-nourished. No distress.  HENT:  Head: Normocephalic and atraumatic.  Right Ear: Hearing, tympanic membrane, external ear and ear canal normal.  Left Ear: Hearing, tympanic membrane, external ear and ear canal normal.  Nose: Nose normal.  Mouth/Throat: Uvula is midline and mucous membranes are normal. No oropharyngeal exudate, posterior oropharyngeal edema or posterior oropharyngeal erythema.  Eyes: Conjunctivae and lids are normal. Right eye exhibits no discharge. Left eye exhibits no discharge. No scleral icterus.  Neck: Carotid bruit is not present.  Cardiovascular: Normal rate, regular rhythm, normal heart sounds and normal pulses.   No murmur heard. Pulmonary/Chest: Effort normal and breath sounds normal. No respiratory distress. She has no wheezes. She has no rhonchi. She has no rales. She exhibits no tenderness.  Musculoskeletal: Normal range of motion.  Lymphadenopathy:       Head (right side): No submental, no submandibular and no tonsillar adenopathy present.       Head (left side): No submental, no submandibular and no tonsillar adenopathy present.    She has no cervical adenopathy.  Neurological: She is alert and oriented to person, place, and time.  Skin: Skin is warm, dry and intact. No lesion and no rash noted.  Psychiatric: She has a normal mood and affect. Her speech is normal and behavior is normal. Thought content normal.   BP 130/62 mmHg  Pulse 108  Temp(Src) 100.3 F (37.9 C) (Oral)  Resp 16  Ht 5' 7"  (1.702 m)  Wt 255 lb (115.667 kg)  BMI 39.93 kg/m2  SpO2 95%  EKG: sinus tachycardia. Mild ST elevation in II, III, AVF. Poor R wave progression in anterior leads  Assessment  and Plan:  1. Chest pain, unspecified chest pain type 2. Abnormal ECG 3. Cough 4. Fever, unspecified Infection likely. Possible secondary ACS. Changes on EKG, specifically ST elevation in II, III, AVF and poor R wave progression. Transported to [facility] by EMS for further evaluation. - EKG 12-Lead   Benjaman Pott. Drenda Freeze, MHS [facility]  12/06/2015

## 2015-12-06 NOTE — Progress Notes (Signed)
EKG read and patient discussed with Ms. Margrett Rud. Agree with assessment and plan of care per her note.

## 2015-12-06 NOTE — ED Notes (Signed)
Dr. Maryan Rued MD at bedside.

## 2015-12-06 NOTE — Discharge Instructions (Signed)

## 2015-12-06 NOTE — ED Provider Notes (Addendum)
CSN: [id number]     Arrival date & time 12/06/15  1537 History   First MD Initiated Contact with Patient 12/06/15 1540     Chief Complaint  Patient presents with  . Abnormal ECG  . URI     (Consider location/radiation/quality/duration/timing/severity/associated sxs/prior Treatment) HPI Comments: In addition to this illness patient states she was going to the bank today around 11:00 AM and she developed chest tightness for 10-15 minutes. Symptoms resolved spontaneously. She was not short of breath or diaphoretic during this episode. She states approximate 5-6 years ago she had an abnormal EKG at Dr. Cordelia Pen office and had a stress test which was normal. No prior family history of cardiac disease and she herself has never had any history of cardiac issues. She does not smoke or drink alcohol. She has had no recent medication changes. At urgent care when they did her EKG they felt that it was abnormal and given her story they sent her here for another evaluation for pneumonia, influenza and ACS.  Patient is a 80 y.o. female presenting with URI. The history is provided by the patient.  URI Presenting symptoms: congestion, cough and fever   Severity:  Moderate Onset quality:  Gradual Duration:  6 days Timing:  Constant Progression:  Waxing and waning Chronicity:  New Relieved by:  Nothing Worsened by:  Nothing tried Ineffective treatments:  OTC medications (Needs the next and Allegra-D) Associated symptoms: myalgias   Associated symptoms: no wheezing   Associated symptoms comment:  No shortness of breath, diaphoresis, nausea or vomiting. No diarrhea or abdominal pain Risk factors: diabetes mellitus and sick contacts     Past Medical History  Diagnosis Date  . Fatty liver   . DIABETES MELLITUS, TYPE II 04/27/2007  . CROHN'S DISEASE, SMALL INTESTINE 06/08/2008    ileum, cecum  . GOITER, MULTINODULAR 05/04/2008  . ANXIETY 04/27/2007  . UNSPECIFIED ANEMIA 05/04/2008    Mild Anemia  .  HYPERTENSION 04/27/2007  . OBESITY 08/01/2010  . DEPRESSION 04/27/2007  . Hyperlipemia   . GERD (gastroesophageal reflux disease)    Past Surgical History  Procedure Laterality Date  . Appendectomy    . Meckel diverticulum excision      Ileal and cecal resection   . Left oophorectomy  1992  . Biopsy thyroid    . Colonoscopy  08/10/2006    normal   Family History  Problem Relation Age of Onset  . Breast cancer Mother 92  . Kidney disease Mother   . Cancer Mother   . Diabetes Mother   . Skin cancer Father   . Cancer Father   . Diabetes Father   . Colon cancer Other     uncle  . Diabetes Other   . Cancer Sister   . Cancer Brother   . Diabetes Brother   . Stroke Maternal Grandmother   . Heart disease Paternal Grandmother    Social History  Substance Use Topics  . Smoking status: Former Research scientist (life sciences)  . Smokeless tobacco: Never Used  . Alcohol Use: No   OB History    No data available     Review of Systems  Constitutional: Positive for fever.  HENT: Positive for congestion.   Respiratory: Positive for cough. Negative for wheezing.   Musculoskeletal: Positive for myalgias.  All other systems reviewed and are negative.     Allergies  Review of patient's allergies indicates no known allergies.  Home Medications   Prior to Admission medications   Medication Sig Start Date  End Date Taking? Authorizing Provider  ALPRAZolam (XANAX) 0.25 MG tablet TAKE AS NEEDED FOR       ANXIETY. 01/16/15   Renato Shin, MD  Cyanocobalamin (VITAMIN B-12) 2500 MCG SUBL Place under the tongue. 1 by mouth in the morning     Historical Provider, MD  ferrous sulfate 325 (65 FE) MG tablet Take 325 mg by mouth daily with breakfast.    Historical Provider, MD  losartan-hydrochlorothiazide (HYZAAR) 100-25 MG tablet TAKE 1 TABLET DAILY. 11/08/15   Renato Shin, MD  mesalamine (PENTASA) 500 MG CR capsule TAKE (1) CAPSULE TWICE DAILY. 11/11/15   Gatha Mayer, MD  metFORMIN (GLUCOPHAGE-XR) 500 MG 24 hr  tablet TAKE (2) TABLETS TWICE DAILY. 11/08/15   Renato Shin, MD  Multiple Vitamin (MULTIVITAMIN) tablet Take 1 tablet by mouth daily.      Historical Provider, MD  ONE TOUCH ULTRA TEST test strip CHECK BLOOD SUGAR ONCE A DAY.    Renato Shin, MD  pioglitazone (ACTOS) 30 MG tablet Take 1 tablet (30 mg total) by mouth daily. 01/22/15   Renato Shin, MD  Probiotic Product (PROBIOTIC + OMEGA-3 PO) Take by mouth. Reported on 12/06/2015    Historical Provider, MD  ranitidine (ZANTAC 75) 75 MG tablet Take 1 tablet (75 mg total) by mouth daily. 12/18/13   Laban Emperor Zehr, PA-C  repaglinide (PRANDIN) 0.5 MG tablet Take 1 tablet (0.5 mg total) by mouth 2 (two) times daily before a meal. 07/23/15   Renato Shin, MD   BP 132/62 mmHg  Pulse 99  Resp 17  Ht 5' 7"  (1.702 m)  Wt 255 lb (115.667 kg)  BMI 39.93 kg/m2  SpO2 97% Physical Exam  Constitutional: She is oriented to person, place, and time. She appears well-developed and well-nourished. No distress.  HENT:  Head: Normocephalic and atraumatic.  Mouth/Throat: Oropharynx is clear and moist.  Eyes: Conjunctivae and EOM are normal. Pupils are equal, round, and reactive to light.  Neck: Normal range of motion. Neck supple.  Cardiovascular: Regular rhythm and intact distal pulses.  Tachycardia present.   No murmur heard. Pulmonary/Chest: Effort normal and breath sounds normal. No respiratory distress. She has no wheezes. She has no rales.  Abdominal: Soft. She exhibits no distension. There is no tenderness. There is no rebound and no guarding.  Musculoskeletal: Normal range of motion. She exhibits edema. She exhibits no tenderness.  Trace edema bilaterally  Neurological: She is alert and oriented to person, place, and time.  Skin: Skin is warm and dry. No rash noted. No erythema.  Psychiatric: She has a normal mood and affect. Her behavior is normal.  Nursing note and vitals reviewed.   ED Course  Procedures (including critical care time) Labs  Review Labs Reviewed  CBC WITH DIFFERENTIAL/PLATELET - Abnormal; Notable for the following:    Hemoglobin 11.6 (*)    HCT 35.7 (*)    All other components within normal limits  I-STAT CHEM 8, ED - Abnormal; Notable for the following:    Chloride 98 (*)    BUN 40 (*)    Creatinine, Ser 1.60 (*)    All other components within normal limits  INFLUENZA PANEL BY PCR (TYPE A & B, H1N1)  I-STAT TROPOININ, ED    Imaging Review Dg Chest 2 View  12/06/2015  CLINICAL DATA:  80 year old female with cough, fever and chills for 5 days. Chest pressure today. EXAM: CHEST  2 VIEW COMPARISON:  05/29/2012 chest radiograph FINDINGS: Cardiomegaly identified. Mild interstitial prominence is noted.  There is no evidence of focal airspace disease, pulmonary edema, suspicious pulmonary nodule/mass, pleural effusion, or pneumothorax. No acute bony abnormalities are identified. IMPRESSION: Mild interstitial prominence is nonspecific but may represent interstitial edema or interstitial infection. Cardiomegaly. Electronically Signed   By: Margarette Canada M.D.   On: 12/06/2015 16:28   I have personally reviewed and evaluated these images and lab results as part of my medical decision-making.   EKG Interpretation   Date/Time:  Friday December 06 2015 15:40:37 EST Ventricular Rate:  99 PR Interval:  146 QRS Duration: 126 QT Interval:  365 QTC Calculation: 468 R Axis:   -77 Text Interpretation:  Sinus tachycardia Ventricular premature complex RBBB  and LAFB Probable left ventricular hypertrophy No previous tracing  Confirmed by Maryan Rued  MD, Loree Fee ([id number]) on 12/06/2015 3:45:46 PM      MDM   Final diagnoses:  Flu    Pt with symptoms consistent with URI Versus pneumonia that has been ongoing for the last 6 days with low-grade fevers to presents from urgent care.  Urgent care setting the patient because she had an abnormal EKG there which showed a right bundle branch block. They felt that she had elevation in 23 and  aVF and because of her story of 10 minutes of chest tightness today when she was going to the bank without other associated symptoms but an abnormal EKG she needed further evaluation. Well appearing here.  No signs of breathing difficulty  No signs of pharyngitis, otitis or abnormal abdominal findings.   Patient EKG here shows a right bundle branch block and sinus tachycardia. Looking at past EKGs both from the urgent care and from 5-6 years ago at Dr. Cordelia Pen office patient's EKG is unchanged. There is no evidence of ST elevation concerning for MI. Patient had no associated symptoms with the chest tightness and she has had illness for the last 6 days.  6:29 PM Patient's labs show a mild elevation in her creatinine but she says she has not had anything to drink since 9 AM this morning. Otherwise her troponin is negative and low suspicion the patient's chest pain today was ACS. Discussed with her that she most likely has the flu. Continue fluids and Tylenol and Mucinex when necessary. Patient was also cautioned if she continues to start having chest pressure with activity or intermittent episodes that are not improving she needs to follow-up with her doctor for outpatient cardiac testing.    Blanchie Dessert, MD 12/06/15 1830  Blanchie Dessert, MD 12/06/15 [phone]

## 2015-12-06 NOTE — ED Notes (Signed)
Pt arrives from [facility] urgent care via [facility] reporting possible change to EKG iwht fever and URI s/s since Sunday. Pt reports coughing and chills since Sunday, "foamy white" sputum .  Pt also reports 10-15 minute period today of "chest pressure," denies n/v, diaphoresis, dizziness.   Pt denies CP at this time.

## 2015-12-16 ENCOUNTER — Other Ambulatory Visit: Payer: Self-pay | Admitting: Endocrinology

## 2015-12-26 ENCOUNTER — Telehealth: Payer: Self-pay

## 2015-12-26 NOTE — Telephone Encounter (Signed)
Spoke to patient and she did not get a flu shot this year, states when she was in office doctor did not mention it and she didn't think of it either

## 2015-12-30 ENCOUNTER — Ambulatory Visit (AMBULATORY_SURGERY_CENTER): Payer: Medicare Other | Admitting: Internal Medicine

## 2015-12-30 ENCOUNTER — Encounter: Payer: Self-pay | Admitting: Internal Medicine

## 2015-12-30 VITALS — BP 120/88 | HR 82 | Temp 98.2°F | Resp 18 | Ht 66.0 in | Wt 264.0 lb

## 2015-12-30 DIAGNOSIS — D509 Iron deficiency anemia, unspecified: Secondary | ICD-10-CM | POA: Diagnosis present

## 2015-12-30 LAB — GLUCOSE, CAPILLARY
GLUCOSE-CAPILLARY: 82 mg/dL (ref 65–99)
GLUCOSE-CAPILLARY: 121 mg/dL — AB (ref 65–99)

## 2015-12-30 MED ORDER — SODIUM CHLORIDE 0.9 % IV SOLN: 500.0000 mL | INTRAVENOUS | Status: DC

## 2015-12-30 MED ORDER — DEXTROSE 5 % IV SOLN: INTRAVENOUS | Status: DC

## 2015-12-30 NOTE — Op Note (Signed)
[name] Patient Name: Marie Rhodes Procedure Date: 12/30/2015 1:53 PM MRN: [id number] Endoscopist: Gatha Mayer , MD Age: 80 Referring MD:  Date of Birth: 1935-11-28 Gender: Female Procedure:                Colonoscopy Indications:              Unexplained iron deficiency anemia Medicines:                Propofol per Anesthesia, Monitored Anesthesia Care Procedure:                Pre-Anesthesia Assessment:                           - Prior to the procedure, a History and Physical                            was performed, and patient medications and                            allergies were reviewed. The patient's tolerance of                            previous anesthesia was also reviewed. The risks                            and benefits of the procedure and the sedation                            options and risks were discussed with the patient.                            All questions were answered, and informed consent                            was obtained. Prior Anticoagulants: The patient has                            taken no previous anticoagulant or antiplatelet                            agents. ASA Grade Assessment: II - A patient with                            mild systemic disease. After reviewing the risks                            and benefits, the patient was deemed in                            satisfactory condition to undergo the procedure.                           After obtaining informed consent, the colonoscope  was passed under direct vision. Throughout the                            procedure, the patient's blood pressure, pulse, and                            oxygen saturations were monitored continuously. The                            Model CF-HQ190L [id number]) scope was introduced                            through the anus and advanced to the the                            ileocolonic anastomosis. The  colonoscopy was                            performed without difficulty. The patient tolerated                            the procedure well. The quality of the bowel                            preparation was excellent. The bowel preparation                            used was Miralax. The terminal ileum and the rectum                            were photographed. Scope In: 2:36:03 PM Scope Out: 2:45:07 PM Scope Withdrawal Time: 0 hours 6 minutes 42 seconds  Total Procedure Duration: 0 hours 9 minutes 4 seconds  Findings:      The neo-terminal ileum appeared normal.      There was evidence of a prior end-to-side ileo-colonic anastomosis in       the transverse colon. This was patent and was characterized by healthy       appearing mucosa. The anastomosis was traversed.      The exam was otherwise without abnormality on direct and retroflexion       views. Complications:            No immediate complications. Estimated Blood Loss:     Estimated blood loss: none. Impression:               - The examined portion of the ileum was normal.                           - Patent end-to-side ileo-colonic anastomosis,                            characterized by healthy appearing mucosa.                           - The examination was otherwise normal on direct  and retroflexion views.                           - No specimens collected. Recommendation:           - Patient has a contact number available for                            emergencies. The signs and symptoms of potential                            delayed complications were discussed with the                            patient. Return to normal activities tomorrow.                            Written discharge instructions were provided to the                            patient.                           - Resume previous diet.                           - Continue present medications.                           -  No repeat colonoscopy due to age.                           - Return to GI clinic in 1 year.                           - She has had need to take iron to maintain Hgb in                            past and came off iron and then had recirrent                            anemia and iron-deficiency so do not plan on EGD Procedure Code(s):        --- Professional ---                           [phone], Colonoscopy, flexible; diagnostic, including                            collection of specimen(s) by brushing or washing,                            when performed (separate procedure) CPT copyright 2016 [employer]. All rights reserved. Gatha Mayer, MD 12/30/2015 2:54:02 PM This report has been signed electronically. Number of Addenda: 0 Referring MD:

## 2015-12-30 NOTE — Patient Instructions (Addendum)
Everything looks ok. Please stay on iron unless told otherwise.  No more routine colonoscopy needed.  I appreciate the opportunity to care for you. Gatha Mayer, MD, FACG  YOU HAD AN ENDOSCOPIC PROCEDURE TODAY AT [facility]:   Refer to the procedure report that was given to you for any specific questions about what was found during the examination.  If the procedure report does not answer your questions, please call your gastroenterologist to clarify.  If you requested that your care partner not be given the details of your procedure findings, then the procedure report has been included in a sealed envelope for you to review at your convenience later.  YOU SHOULD EXPECT: Some feelings of bloating in the abdomen. Passage of more gas than usual.  Walking can help get rid of the air that was put into your GI tract during the procedure and reduce the bloating. If you had a lower endoscopy (such as a colonoscopy or flexible sigmoidoscopy) you may notice spotting of blood in your stool or on the toilet paper. If you underwent a bowel prep for your procedure, you may not have a normal bowel movement for a few days.  Please Note:  You might notice some irritation and congestion in your nose or some drainage.  This is from the oxygen used during your procedure.  There is no need for concern and it should clear up in a day or so.  SYMPTOMS TO REPORT IMMEDIATELY:   Following lower endoscopy (colonoscopy or flexible sigmoidoscopy):  Excessive amounts of blood in the stool  Significant tenderness or worsening of abdominal pains  Swelling of the abdomen that is new, acute  Fever of 100F or higher  For urgent or emergent issues, a gastroenterologist can be reached at any hour by calling [phone].  DIET: Your first meal following the procedure should be a small meal and then it is ok to progress to your normal diet. Heavy or fried foods are harder to digest and may make  you feel nauseous or bloated.  Likewise, meals heavy in dairy and vegetables can increase bloating.  Drink plenty of fluids but you should avoid alcoholic beverages for 24 hours.  ACTIVITY:  You should plan to take it easy for the rest of today and you should NOT DRIVE or use heavy machinery until tomorrow (because of the sedation medicines used during the test).    FOLLOW UP: Our staff will call the number listed on your records the next business day following your procedure to check on you and address any questions or concerns that you may have regarding the information given to you following your procedure. If we do not reach you, we will leave a message.  However, if you are feeling well and you are not experiencing any problems, there is no need to return our call.  We will assume that you have returned to your regular daily activities without incident.  If any biopsies were taken you will be contacted by phone or by letter within the next 1-3 weeks.  Please call us at [phone] if you have not heard about the biopsies in 3 weeks.   SIGNATURES/CONFIDENTIALITY: You and/or your care partner have signed paperwork which will be entered into your electronic medical record.  These signatures attest to the fact that that the information above on your After Visit Summary has been reviewed and is understood.  Full responsibility of the confidentiality of this discharge information lies with  you and/or your care-partner.  Please continue your normal medications, including Iron  Follow up with Dr. Carlean Purl in 1 year

## 2015-12-30 NOTE — Progress Notes (Signed)
Report given to PACU RN, vss

## 2015-12-31 ENCOUNTER — Telehealth: Payer: Self-pay

## 2015-12-31 NOTE — Telephone Encounter (Signed)
  Follow up Call-  Call back number 12/30/2015  Post procedure Call Back phone  # [phone]  Permission to leave phone message Yes     Patient questions:  Do you have a fever, pain , or abdominal swelling? No. Pain Score  0 *  Have you tolerated food without any problems? Yes.    Have you been able to return to your normal activities? Yes.    Do you have any questions about your discharge instructions: Diet   No. Medications  No. Follow up visit  No.  Do you have questions or concerns about your Care? No.  Actions: * If pain score is 4 or above: No action needed, pain <4.

## 2016-01-07 ENCOUNTER — Other Ambulatory Visit: Payer: Self-pay | Admitting: Endocrinology

## 2016-01-17 ENCOUNTER — Other Ambulatory Visit: Payer: Self-pay | Admitting: Endocrinology

## 2016-01-21 ENCOUNTER — Other Ambulatory Visit: Payer: Self-pay

## 2016-01-21 ENCOUNTER — Other Ambulatory Visit (INDEPENDENT_AMBULATORY_CARE_PROVIDER_SITE_OTHER): Payer: Medicare Other

## 2016-01-21 ENCOUNTER — Ambulatory Visit: Payer: Medicare Other | Admitting: Endocrinology

## 2016-01-21 ENCOUNTER — Other Ambulatory Visit: Payer: Self-pay | Admitting: Endocrinology

## 2016-01-21 DIAGNOSIS — E119 Type 2 diabetes mellitus without complications: Secondary | ICD-10-CM

## 2016-01-21 LAB — URINALYSIS, ROUTINE W REFLEX MICROSCOPIC
Bilirubin Urine: NEGATIVE
Hgb urine dipstick: NEGATIVE
LEUKOCYTES UA: NEGATIVE
Nitrite: NEGATIVE
SPECIFIC GRAVITY, URINE: 1.025 (ref 1.000–1.030)
URINE GLUCOSE: NEGATIVE
Urobilinogen, UA: 0.2 (ref 0.0–1.0)
pH: 5.5 (ref 5.0–8.0)

## 2016-01-21 LAB — HEPATIC FUNCTION PANEL
ALT: 19 U/L (ref 0–35)
AST: 22 U/L (ref 0–37)
Albumin: 3.9 g/dL (ref 3.5–5.2)
Alkaline Phosphatase: 90 U/L (ref 39–117)
BILIRUBIN DIRECT: 0 mg/dL (ref 0.0–0.3)
Total Bilirubin: 0.4 mg/dL (ref 0.2–1.2)
Total Protein: 7 g/dL (ref 6.0–8.3)

## 2016-01-21 LAB — BASIC METABOLIC PANEL
BUN: 27 mg/dL — AB (ref 6–23)
CO2: 27 mEq/L (ref 19–32)
Calcium: 10.2 mg/dL (ref 8.4–10.5)
Chloride: 104 mEq/L (ref 96–112)
Creatinine, Ser: 1.3 mg/dL — ABNORMAL HIGH (ref 0.40–1.20)
GFR: 42.14 mL/min — AB (ref >60.00)
GLUCOSE: 120 mg/dL — AB (ref 70–99)
POTASSIUM: 4.4 meq/L (ref 3.5–5.1)
Sodium: 138 mEq/L (ref 135–145)

## 2016-01-21 LAB — MICROALBUMIN / CREATININE URINE RATIO
Creatinine,U: 353.4 mg/dL
MICROALB/CREAT RATIO: 2.1 mg/g (ref 0.0–30.0)
Microalb, Ur: 7.3 mg/dL — ABNORMAL HIGH (ref 0.0–1.9)

## 2016-01-21 LAB — LIPID PANEL
CHOL/HDL RATIO: 4
Cholesterol: 168 mg/dL (ref 0–200)
HDL: 43.3 mg/dL (ref >39.00)
LDL CALC: 91 mg/dL (ref 0–99)
NONHDL: 124.56
Triglycerides: 170 mg/dL — ABNORMAL HIGH (ref 0.0–149.0)
VLDL: 34 mg/dL (ref 0.0–40.0)

## 2016-01-21 LAB — CBC WITH DIFFERENTIAL/PLATELET
Basophils Absolute: 0 10*3/uL (ref 0.0–0.1)
Basophils Relative: 0.1 % (ref 0.0–3.0)
EOS ABS: 0.3 10*3/uL (ref 0.0–0.7)
Eosinophils Relative: 4.8 % (ref 0.0–5.0)
HCT: 35.8 % — ABNORMAL LOW (ref 36.0–46.0)
HEMOGLOBIN: 12 g/dL (ref 12.0–15.0)
Lymphocytes Relative: 26.3 % (ref 12.0–46.0)
Lymphs Abs: 1.8 10*3/uL (ref 0.7–4.0)
MCHC: 33.5 g/dL (ref 30.0–36.0)
MCV: 88.6 fl (ref 78.0–100.0)
Monocytes Absolute: 0.4 10*3/uL (ref 0.1–1.0)
Monocytes Relative: 6 % (ref 3.0–12.0)
Neutro Abs: 4.2 10*3/uL (ref 1.4–7.7)
Neutrophils Relative %: 62.8 % (ref 43.0–77.0)
Platelets: 271 10*3/uL (ref 150.0–400.0)
RBC: 4.03 Mil/uL (ref 3.87–5.11)
RDW: 13.9 % (ref 11.5–15.5)
WBC: 6.7 10*3/uL (ref 4.0–10.5)

## 2016-01-21 LAB — TSH: TSH: 2.15 u[IU]/mL (ref 0.35–4.50)

## 2016-01-21 LAB — HEMOGLOBIN A1C: HEMOGLOBIN A1C: 6.7 % — AB (ref 4.6–6.5)

## 2016-01-21 NOTE — Telephone Encounter (Signed)
Please advise if ok to refill. Thanks 

## 2016-01-28 ENCOUNTER — Ambulatory Visit (INDEPENDENT_AMBULATORY_CARE_PROVIDER_SITE_OTHER): Payer: Medicare Other | Admitting: Endocrinology

## 2016-01-28 ENCOUNTER — Encounter: Payer: Self-pay | Admitting: Endocrinology

## 2016-01-28 VITALS — BP 145/92 | HR 109 | Temp 98.1°F | Ht 67.0 in | Wt 250.0 lb

## 2016-01-28 DIAGNOSIS — N289 Disorder of kidney and ureter, unspecified: Secondary | ICD-10-CM

## 2016-01-28 DIAGNOSIS — D509 Iron deficiency anemia, unspecified: Secondary | ICD-10-CM

## 2016-01-28 DIAGNOSIS — I10 Essential (primary) hypertension: Secondary | ICD-10-CM | POA: Diagnosis not present

## 2016-01-28 DIAGNOSIS — E042 Nontoxic multinodular goiter: Secondary | ICD-10-CM

## 2016-01-28 DIAGNOSIS — E7800 Pure hypercholesterolemia, unspecified: Secondary | ICD-10-CM

## 2016-01-28 DIAGNOSIS — E119 Type 2 diabetes mellitus without complications: Secondary | ICD-10-CM

## 2016-01-28 NOTE — Progress Notes (Signed)
Subjective:    Patient ID: Marie Rhodes, female    DOB: [birth year], 80 y.o.   MRN: [id number]  HPI Pt is here for regular wellness examination, and is feeling pretty well in general, and says chronic med probs are stable, except as noted below Past Medical History  Diagnosis Date  . Fatty liver   . DIABETES MELLITUS, TYPE II 04/27/2007  . CROHN'S DISEASE, SMALL INTESTINE 06/08/2008    ileum, cecum  . GOITER, MULTINODULAR 05/04/2008  . ANXIETY 04/27/2007  . UNSPECIFIED ANEMIA 05/04/2008    Mild Anemia  . HYPERTENSION 04/27/2007  . OBESITY 08/01/2010  . DEPRESSION 04/27/2007  . Hyperlipemia   . GERD (gastroesophageal reflux disease)     Past Surgical History  Procedure Laterality Date  . Appendectomy    . Meckel diverticulum excision      Ileal and cecal resection   . Left oophorectomy  1992  . Biopsy thyroid    . Colonoscopy  08/10/2006    normal    Social History   Social History  . Marital Status: Widowed    Spouse Name: N/A  . Number of Children: N/A  . Years of Education: N/A   Occupational History  . 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 Smoking status: Former Smoker    Quit date: 05/29/1988  . Smokeless tobacco: Never Used  . Alcohol Use: No  . Drug Use: No  . Sexual Activity: Not on file   Other Topics Concern  . Not on file   Social History Narrative   Pt does get regular exercise - walking at gym   Former Smoker (college only)    Current Outpatient Prescriptions on File Prior to Visit  Medication Sig Dispense Refill  . ALPRAZolam (XANAX) 0.25 MG tablet TAKE AS NEEDED FOR ANXIETY. 30 tablet 0  . Cyanocobalamin (VITAMIN B-12) 2500 MCG SUBL Place 2,500 mcg under the tongue daily. 1 by mouth in the morning    . ferrous sulfate 325 (65 FE) MG tablet Take 325 mg by mouth every evening.     Marland Kitchen losartan-hydrochlorothiazide (HYZAAR) 100-25 MG tablet TAKE 1 TABLET DAILY. 30 tablet 0  . mesalamine (PENTASA) 500 MG CR capsule  TAKE (1) CAPSULE TWICE DAILY. 60 capsule 11  . metFORMIN (GLUCOPHAGE-XR) 500 MG 24 hr tablet TAKE (2) TABLETS TWICE DAILY. 120 tablet 0  . Multiple Vitamin (MULTIVITAMIN) tablet Take 1 tablet by mouth daily.      . ONE TOUCH ULTRA TEST test strip CHECK BLOOD SUGAR ONCE A DAY. 50 each 2  . pioglitazone (ACTOS) 30 MG tablet Take 1 tablet (30 mg total) by mouth daily. 30 tablet 11  . repaglinide (PRANDIN) 0.5 MG tablet Take 1 tablet (0.5 mg total) by mouth 2 (two) times daily before a meal. 60 tablet 11   No current facility-administered medications on file prior to visit.    No Known Allergies  Family History  Problem Relation Age of Onset  . Breast cancer Mother 73  . Kidney disease Mother   . Cancer Mother   . Diabetes Mother   . Skin cancer Father   . Cancer Father   . Diabetes Father   . Colon cancer Other     uncle  . Diabetes Other   . Cancer Sister   . Diabetes Brother   . Cancer Brother 62    tongue cancer  . Stroke Maternal Grandmother   . Heart disease Paternal Grandmother  BP 145/92 mmHg  Pulse 109  Temp(Src) 98.1 F (36.7 C) (Oral)  Ht 5' 7"  (1.702 m)  Wt 250 lb (113.399 kg)  BMI 39.15 kg/m2  SpO2 93%  Review of Systems  Constitutional: Negative for fever.       She has lost weight, due to her efforts  HENT: Negative for hearing loss.   Eyes: Negative for visual disturbance.  Respiratory: Negative for shortness of breath.   Cardiovascular: Negative for chest pain.  Gastrointestinal: Negative for anal bleeding.  Endocrine: Negative for cold intolerance.  Genitourinary: Negative for hematuria.  Musculoskeletal: Positive for back pain.  Skin: Negative for rash.  Allergic/Immunologic: Positive for environmental allergies.  Neurological: Negative for numbness.  Hematological: Negative for adenopathy.  Psychiatric/Behavioral: Negative for dysphoric mood.      Objective:   Physical Exam .VS: see vs page GEN: no distress HEAD: head: no  deformity eyes: no periorbital swelling, no proptosis external nose and ears are normal mouth: no lesion seen NECK: 2 cm RLP thyroid nodule CHEST WALL: no deformity LUNGS:  Clear to auscultation BREASTS: sees gyn CV: reg rate and rhythm, no murmur ABD: abdomen is soft, nontender.  no hepatosplenomegaly.  not distended.  no hernia GENITALIA/RECTAL: sees gyn MUSCULOSKELETAL: muscle bulk and strength are grossly normal.  no obvious joint swelling.  gait is normal and steady EXTEMITIES: no deformity.  no ulcer on the feet.  feet are of normal color and temp.  no edema PULSES: dorsalis pedis intact bilat.  no carotid bruit NEURO:  cn 2-12 grossly intact.   readily moves all 4's.  sensation is intact to touch on the feet SKIN:  Normal texture and temperature.  No rash or suspicious lesion is visible.   NODES:  None palpable at the neck PSYCH: alert, well-oriented.  Does not appear anxious nor depressed.      Assessment & Plan:  Wellness visit today, with problems stable, except as noted. Multinodular goiter, due for recheck  Patient is advised the following: Patient Instructions  Let's check an ultrasound.  you will receive a phone call, about a day and time for an appointment. Please continue the same medications for diabetes.  please consider these measures for your health:  minimize alcohol.  do not use tobacco products.  have a colonoscopy at least every 10 years from age 11.  Women should have an annual mammogram from age 84.  keep firearms safely stored.  always use seat belts.  have working smoke alarms in your home.  see an eye doctor and dentist regularly.  never drive under the influence of alcohol or drugs (including prescription drugs).  those with fair skin should take precautions against the sun.  it is critically important to prevent falling down (keep floor areas well-lit, dry, and free of loose objects.  If you have a cane, walker, or wheelchair, you should use it, even for  short trips around the house.  Wear flat-soled shoes.  Also, try not to rush). Please return in 6 months.

## 2016-01-28 NOTE — Patient Instructions (Addendum)
Let's check an ultrasound.  you will receive a phone call, about a day and time for an appointment. Please continue the same medications for diabetes.  please consider these measures for your health:  minimize alcohol.  do not use tobacco products.  have a colonoscopy at least every 10 years from age 78.  Women should have an annual mammogram from age 70.  keep firearms safely stored.  always use seat belts.  have working smoke alarms in your home.  see an eye doctor and dentist regularly.  never drive under the influence of alcohol or drugs (including prescription drugs).  those with fair skin should take precautions against the sun.  it is critically important to prevent falling down (keep floor areas well-lit, dry, and free of loose objects.  If you have a cane, walker, or wheelchair, you should use it, even for short trips around the house.  Wear flat-soled shoes.  Also, try not to rush). Please return in 6 months.

## 2016-01-28 NOTE — Progress Notes (Signed)
we discussed code status.  pt requests full code, but would not want to be started or maintained on artificial life-support measures if there was not a reasonable chance of recovery 

## 2016-02-04 ENCOUNTER — Ambulatory Visit
Admission: RE | Admit: 2016-02-04 | Discharge: 2016-02-04 | Disposition: A | Discharge status: Home / Self Care | Payer: Medicare Other | Source: Ambulatory Visit | Attending: Endocrinology | Admitting: Endocrinology

## 2016-02-04 DIAGNOSIS — E042 Nontoxic multinodular goiter: Secondary | ICD-10-CM

## 2016-02-18 ENCOUNTER — Other Ambulatory Visit: Payer: Self-pay | Admitting: Endocrinology

## 2016-03-29 ENCOUNTER — Other Ambulatory Visit: Payer: Self-pay | Admitting: Endocrinology

## 2016-04-24 ENCOUNTER — Other Ambulatory Visit: Payer: Self-pay | Admitting: Endocrinology

## 2016-07-28 ENCOUNTER — Other Ambulatory Visit: Payer: Self-pay | Admitting: Endocrinology

## 2016-07-31 ENCOUNTER — Ambulatory Visit: Payer: Medicare Other | Admitting: Endocrinology

## 2016-08-04 ENCOUNTER — Other Ambulatory Visit: Payer: Self-pay | Admitting: Endocrinology

## 2016-09-04 ENCOUNTER — Other Ambulatory Visit: Payer: Self-pay | Admitting: Endocrinology

## 2016-09-08 ENCOUNTER — Other Ambulatory Visit: Payer: Self-pay | Admitting: Endocrinology

## 2016-10-04 NOTE — Progress Notes (Signed)
Subjective:    Patient ID: Marie Rhodes, female    DOB: September 05, 1936, 81 y.o.   MRN: [id number]  HPI  The state of at least three ongoing medical problems is addressed today, with interval history of each noted here: Pt returns for f/u of diabetes mellitus: DM type: 2 Dx'ed: 6160 Complications: renal insufficiency Therapy: 3 oral meds GDM: never DKA: never Severe hypoglycemia: never Pancreatitis: never Other:she has never taken insulin  Interval history: pt states she feels well in general.  She denies hypoglycemia Dyslipidemia: she is off chol med.  Denies chest pain.  HTN: she is off metoprolol.  Denies sob.   Past Medical History:  Diagnosis Date  . ANXIETY 04/27/2007  . CROHN'S DISEASE, SMALL INTESTINE 06/08/2008   ileum, cecum  . DEPRESSION 04/27/2007  . DIABETES MELLITUS, TYPE II 04/27/2007  . Fatty liver   . GERD (gastroesophageal reflux disease)   . GOITER, MULTINODULAR 05/04/2008  . Hyperlipemia   . HYPERTENSION 04/27/2007  . OBESITY 08/01/2010  . UNSPECIFIED ANEMIA 05/04/2008   Mild Anemia    Past Surgical History:  Procedure Laterality Date  . APPENDECTOMY    . BIOPSY THYROID    . COLONOSCOPY  08/10/2006   normal  . LEFT OOPHORECTOMY  1992  . MECKEL DIVERTICULUM EXCISION     Ileal and cecal resection     Social History   Social History  . Marital status: Widowed    Spouse name: N/A  . Number of children: N/A  . Years of education: N/A   Occupational History  . 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 Smoking status: Former Smoker    Quit date: 05/29/1988  . Smokeless tobacco: Never Used  . Alcohol use No  . Drug use: No  . Sexual activity: Not on file   Other Topics Concern  . Not on file   Social History Narrative   Pt does get regular exercise - walking at gym   Former Smoker (college only)    Current Outpatient Prescriptions on File Prior to Visit  Medication Sig Dispense Refill  . ALPRAZolam (XANAX) 0.25 MG  tablet TAKE AS NEEDED FOR ANXIETY. 30 tablet 0  . Cyanocobalamin (VITAMIN B-12) 2500 MCG SUBL Place 2,500 mcg under the tongue daily. 1 by mouth in the morning    . ferrous sulfate 325 (65 FE) MG tablet Take 325 mg by mouth every evening.     . mesalamine (PENTASA) 500 MG CR capsule TAKE (1) CAPSULE TWICE DAILY. 60 capsule 11  . Multiple Vitamin (MULTIVITAMIN) tablet Take 1 tablet by mouth daily.      . ONE TOUCH ULTRA TEST test strip CHECK BLOOD SUGAR ONCE A DAY. 50 each 2   No current facility-administered medications on file prior to visit.     No Known Allergies  Family History  Problem Relation Age of Onset  . Breast cancer Mother 19  . Kidney disease Mother   . Cancer Mother   . Diabetes Mother   . Skin cancer Father   . Cancer Father   . Diabetes Father   . Colon cancer Other     uncle  . Diabetes Other   . Cancer Sister   . Diabetes Brother   . Cancer Brother 62    tongue cancer  . Stroke Maternal Grandmother   . Heart disease Paternal Grandmother     BP 122/62   Pulse (!) 106   Ht 5'  7" (1.702 m)   Wt 256 lb (116.1 kg)   SpO2 97%   BMI 40.10 kg/m   Review of Systems Denies edema.  She has lost a few lbs.      Objective:   Physical Exam VITAL SIGNS:  See vs page GENERAL: no distress Pulses: dorsalis pedis intact bilat.   MSK: no deformity of the feet CV: 1+ bilat leg edema Skin:  no ulcer on the feet.  normal color and temp on the feet. Neuro: sensation is intact to touch on the feet  Lab Results  Component Value Date   CHOL 171 10/05/2016   HDL 50.40 10/05/2016   LDLCALC 91 01/21/2016   LDLDIRECT 73.0 10/05/2016   TRIG 275.0 (H) 10/05/2016   CHOLHDL 3 10/05/2016   Lab Results  Component Value Date   HGBA1C 5.7 10/05/2016      Assessment & Plan:  Edema, new.  reduce pioglitizone Dyslipidemia: well-controlled off pravachol.  Ok to stay off HTN: well-controlled.  Please continue the same medication. Type  2 DM: well-controlled.  Please  continue the same prandin.  Subjective:   Patient here for Medicare annual wellness visit and management of other chronic and acute problems.     Risk factors: advanced age    53 of Physicians Providing Medical Care to Patient:  See "snapshot"   Activities of Daily Living: In your present state of health, do you have any difficulty performing the following activities (lives alone)?:  Preparing food and eating?: No  Bathing yourself: No  Getting dressed: No  Using the toilet:No  Moving around from place to place: No  In the past year have you fallen or had a near fall?: No    Home Safety: Has smoke detector and wears seat belts. No firearms. No excess sun exposure.   Diet and Exercise  Current exercise habits: pt says not good Dietary issues discussed: pt says diet is fair  Depression Screen  Q1: Over the past two weeks, have you felt down, depressed or hopeless?no  Q2: Over the past two weeks, have you felt little interest or pleasure in doing things? no   The following portions of the patient's history were reviewed and updated as appropriate: allergies, current medications, past family history, past medical history, past social history, past surgical history and problem list.   Review of Systems  No change in chronic hearing and visual loss.  Objective:   Vision:  Advertising account executive, so she declines VA today.  Hearing: grossly normal.  Body mass index:  See vs page.   Msk: pt easily and quickly performs "get-up-and-go" from a sitting position.   Cognitive Impairment Assessment: cognition, memory and judgment appear normal.  remembers 2/3 at 5 minutes.  excellent recall.  can easily read and write a sentence.  alert and oriented x 3.     Assessment:   Medicare wellness utd on preventive parameters    Plan:   During the course of the visit the patient was educated and counseled about appropriate screening and preventive services including:        Fall  prevention   Screening mammography is UTD Bone densitometry screening is ordered today Diabetes screening is done Nutrition counseling is offered  Vaccines / LABS Zostavax / Pneumococcal Vaccine  today   Patient Instructions (the written plan) was given to the patient.

## 2016-10-05 ENCOUNTER — Encounter: Payer: Self-pay | Admitting: Endocrinology

## 2016-10-05 ENCOUNTER — Other Ambulatory Visit: Payer: Self-pay

## 2016-10-05 ENCOUNTER — Ambulatory Visit (INDEPENDENT_AMBULATORY_CARE_PROVIDER_SITE_OTHER): Payer: Medicare Other | Admitting: Endocrinology

## 2016-10-05 VITALS — BP 122/62 | HR 106 | Ht 67.0 in | Wt 256.0 lb

## 2016-10-05 DIAGNOSIS — N289 Disorder of kidney and ureter, unspecified: Secondary | ICD-10-CM | POA: Diagnosis not present

## 2016-10-05 DIAGNOSIS — Z780 Asymptomatic menopausal state: Secondary | ICD-10-CM | POA: Diagnosis not present

## 2016-10-05 DIAGNOSIS — E119 Type 2 diabetes mellitus without complications: Secondary | ICD-10-CM | POA: Diagnosis not present

## 2016-10-05 LAB — BASIC METABOLIC PANEL
BUN: 28 mg/dL — AB (ref 6–23)
CALCIUM: 10.6 mg/dL — AB (ref 8.4–10.5)
CHLORIDE: 103 meq/L (ref 96–112)
CO2: 27 mEq/L (ref 19–32)
CREATININE: 1.37 mg/dL — AB (ref 0.40–1.20)
GFR: 39.6 mL/min — ABNORMAL LOW (ref >60.00)
Glucose, Bld: 73 mg/dL (ref 70–99)
Potassium: 4 mEq/L (ref 3.5–5.1)
Sodium: 140 mEq/L (ref 135–145)

## 2016-10-05 LAB — LIPID PANEL
CHOL/HDL RATIO: 3
CHOLESTEROL: 171 mg/dL (ref 0–200)
HDL: 50.4 mg/dL (ref >39.00)
NonHDL: 120.61
TRIGLYCERIDES: 275 mg/dL — AB (ref 0.0–149.0)
VLDL: 55 mg/dL — ABNORMAL HIGH (ref 0.0–40.0)

## 2016-10-05 LAB — LDL CHOLESTEROL, DIRECT: Direct LDL: 73 mg/dL

## 2016-10-05 LAB — POCT GLYCOSYLATED HEMOGLOBIN (HGB A1C): HEMOGLOBIN A1C: 5.7

## 2016-10-05 MED ORDER — PIOGLITAZONE HCL 30 MG PO TABS: ORAL_TABLET | ORAL | 5 refills | Status: DC

## 2016-10-05 MED ORDER — METFORMIN HCL ER 500 MG PO TB24: ORAL_TABLET | ORAL | 5 refills | Status: DC

## 2016-10-05 MED ORDER — REPAGLINIDE 0.5 MG PO TABS: 0.5000 mg | ORAL_TABLET | Freq: Two times a day (BID) | ORAL | 11 refills | Status: DC

## 2016-10-05 MED ORDER — PIOGLITAZONE HCL 15 MG PO TABS: 15.0000 mg | ORAL_TABLET | Freq: Every day | ORAL | 11 refills | Status: DC

## 2016-10-05 MED ORDER — LOSARTAN POTASSIUM-HCTZ 100-25 MG PO TABS: 1.0000 | ORAL_TABLET | Freq: Every day | ORAL | 5 refills | Status: DC

## 2016-10-05 MED ORDER — REPAGLINIDE 0.5 MG PO TABS: ORAL_TABLET | ORAL | 5 refills | Status: DC

## 2016-10-05 NOTE — Patient Instructions (Addendum)
blood tests are requested for you today.  We'll let you know about the results.  Please reduce the pioglitizone to 15 mg daily.  Please consider these measures for your health:  minimize alcohol.  Do not use tobacco products.  Have a colonoscopy at least every 10 years from age 79.  Women should have an annual mammogram from age 27.  Keep firearms safely stored.  Always use seat belts.  have working smoke alarms in your home.  See an eye doctor and dentist regularly.  Never drive under the influence of alcohol or drugs (including prescription drugs).  Those with fair skin should take precautions against the sun, and should carefully examine their skin once per month, for any new or changed moles. It is critically important to prevent falling down (keep floor areas well-lit, dry, and free of loose objects.  If you have a cane, walker, or wheelchair, you should use it, even for short trips around the house.  Wear flat-soled shoes.  Also, try not to rush).   Please come back for a regular physical appointment in 4 months.

## 2016-10-05 NOTE — Progress Notes (Signed)
we discussed code status.  pt requests full code, but would not want to be started or maintained on artificial life-support measures if there was not a reasonable chance of recovery 

## 2016-10-26 LAB — HM DIABETES EYE EXAM

## 2016-12-04 ENCOUNTER — Other Ambulatory Visit: Payer: Self-pay | Admitting: Internal Medicine

## 2016-12-04 NOTE — Telephone Encounter (Signed)
How many refills Sir, thank you.

## 2016-12-07 NOTE — Telephone Encounter (Signed)
Refill x 1 year

## 2017-02-08 ENCOUNTER — Ambulatory Visit (INDEPENDENT_AMBULATORY_CARE_PROVIDER_SITE_OTHER): Payer: Medicare Other | Admitting: Endocrinology

## 2017-02-08 ENCOUNTER — Encounter: Payer: Medicare Other | Admitting: Endocrinology

## 2017-02-08 ENCOUNTER — Encounter: Payer: Self-pay | Admitting: Endocrinology

## 2017-02-08 VITALS — BP 100/42 | HR 83 | Ht 67.0 in | Wt 232.0 lb

## 2017-02-08 DIAGNOSIS — E119 Type 2 diabetes mellitus without complications: Secondary | ICD-10-CM

## 2017-02-08 DIAGNOSIS — E042 Nontoxic multinodular goiter: Secondary | ICD-10-CM | POA: Diagnosis not present

## 2017-02-08 DIAGNOSIS — E7800 Pure hypercholesterolemia, unspecified: Secondary | ICD-10-CM

## 2017-02-08 DIAGNOSIS — Z0000 Encounter for general adult medical examination without abnormal findings: Secondary | ICD-10-CM | POA: Diagnosis not present

## 2017-02-08 DIAGNOSIS — N289 Disorder of kidney and ureter, unspecified: Secondary | ICD-10-CM | POA: Diagnosis not present

## 2017-02-08 LAB — TSH: TSH: 1.31 u[IU]/mL (ref 0.35–4.50)

## 2017-02-08 LAB — URINALYSIS, ROUTINE W REFLEX MICROSCOPIC
Bilirubin Urine: NEGATIVE
Hgb urine dipstick: NEGATIVE
Ketones, ur: NEGATIVE
Leukocytes, UA: NEGATIVE
Nitrite: NEGATIVE
PH: 5.5 (ref 5.0–8.0)
RBC / HPF: NONE SEEN (ref 0–?)
SPECIFIC GRAVITY, URINE: 1.02 (ref 1.000–1.030)
Total Protein, Urine: NEGATIVE
UROBILINOGEN UA: 0.2 (ref 0.0–1.0)
Urine Glucose: NEGATIVE
WBC, UA: NONE SEEN (ref 0–?)

## 2017-02-08 LAB — BASIC METABOLIC PANEL
BUN: 37 mg/dL — ABNORMAL HIGH (ref 6–23)
CO2: 26 mEq/L (ref 19–32)
Calcium: 10.5 mg/dL (ref 8.4–10.5)
Chloride: 104 mEq/L (ref 96–112)
Creatinine, Ser: 1.55 mg/dL — ABNORMAL HIGH (ref 0.40–1.20)
GFR: 34.31 mL/min — AB (ref >60.00)
Glucose, Bld: 79 mg/dL (ref 70–99)
Potassium: 4 mEq/L (ref 3.5–5.1)
SODIUM: 138 meq/L (ref 135–145)

## 2017-02-08 LAB — CBC WITH DIFFERENTIAL/PLATELET
BASOS PCT: 0.4 % (ref 0.0–3.0)
Basophils Absolute: 0 10*3/uL (ref 0.0–0.1)
EOS ABS: 0.3 10*3/uL (ref 0.0–0.7)
Eosinophils Relative: 3.7 % (ref 0.0–5.0)
HEMATOCRIT: 37.3 % (ref 36.0–46.0)
Hemoglobin: 12.5 g/dL (ref 12.0–15.0)
LYMPHS ABS: 2.2 10*3/uL (ref 0.7–4.0)
LYMPHS PCT: 30.3 % (ref 12.0–46.0)
MCHC: 33.5 g/dL (ref 30.0–36.0)
MCV: 90.3 fl (ref 78.0–100.0)
Monocytes Absolute: 0.5 10*3/uL (ref 0.1–1.0)
Monocytes Relative: 6.4 % (ref 3.0–12.0)
NEUTROS ABS: 4.3 10*3/uL (ref 1.4–7.7)
NEUTROS PCT: 59.2 % (ref 43.0–77.0)
PLATELETS: 266 10*3/uL (ref 150.0–400.0)
RBC: 4.14 Mil/uL (ref 3.87–5.11)
RDW: 13.4 % (ref 11.5–15.5)
WBC: 7.3 10*3/uL (ref 4.0–10.5)

## 2017-02-08 LAB — HEPATIC FUNCTION PANEL
ALK PHOS: 97 U/L (ref 39–117)
ALT: 18 U/L (ref 0–35)
AST: 21 U/L (ref 0–37)
Albumin: 4.5 g/dL (ref 3.5–5.2)
BILIRUBIN DIRECT: 0.1 mg/dL (ref 0.0–0.3)
BILIRUBIN TOTAL: 0.5 mg/dL (ref 0.2–1.2)
TOTAL PROTEIN: 7.4 g/dL (ref 6.0–8.3)

## 2017-02-08 LAB — MICROALBUMIN / CREATININE URINE RATIO
CREATININE, U: 154.3 mg/dL
Microalb Creat Ratio: 0.5 mg/g (ref 0.0–30.0)

## 2017-02-08 LAB — HEMOGLOBIN A1C: HEMOGLOBIN A1C: 6 % (ref 4.6–6.5)

## 2017-02-08 MED ORDER — LOSARTAN POTASSIUM-HCTZ 50-12.5 MG PO TABS: 1.0000 | ORAL_TABLET | Freq: Every day | ORAL | 11 refills | Status: DC

## 2017-02-08 MED ORDER — ALPRAZOLAM 0.25 MG PO TABS: ORAL_TABLET | ORAL | 0 refills | Status: DC

## 2017-02-08 NOTE — Patient Instructions (Addendum)
blood tests are requested for you today.  We'll let you know about the results. Based on the results, we may be able to stop the repaglinide.  check your blood sugar once a day.  vary the time of day when you check, between before the 3 meals, and at bedtime.  also check if you have symptoms of your blood sugar being too high or too low.  please keep a record of the readings and bring it to your next appointment here (or you can bring the meter itself).  You can write it on any piece of paper.  please call us sooner if your blood sugar goes below 70, or if you have a lot of readings over 200.  Please consider these measures for your health:  minimize alcohol.  Do not use tobacco products.  Have a colonoscopy at least every 10 years from age 45.  Women should have an annual mammogram from age 18.  Keep firearms safely stored.  Always use seat belts.  have working smoke alarms in your home.  See an eye doctor and dentist regularly.  Never drive under the influence of alcohol or drugs (including prescription drugs).  Those with fair skin should take precautions against the sun, and should carefully examine their skin once per month, for any new or changed moles. It is critically important to prevent falling down (keep floor areas well-lit, dry, and free of loose objects.  If you have a cane, walker, or wheelchair, you should use it, even for short trips around the house.  Wear flat-soled shoes.  Also, try not to rush)  I have sent a prescription to your pharmacy, to half the losartan-HCTZ.   We can wait another year for the thyroid ultrasound.  Please come back for a follow-up appointment in 4 months.

## 2017-02-08 NOTE — Progress Notes (Signed)
Subjective:    Patient ID: Marie Rhodes, female    DOB: [birth year], 81 y.o.   MRN: [id number]  HPI Pt is here for regular wellness examination, and is feeling pretty well in general, and says chronic med probs are stable, except as noted below Past Medical History:  Diagnosis Date  . ANXIETY 04/27/2007  . CROHN'S DISEASE, SMALL INTESTINE 06/08/2008   ileum, cecum  . DEPRESSION 04/27/2007  . DIABETES MELLITUS, TYPE II 04/27/2007  . Fatty liver   . GERD (gastroesophageal reflux disease)   . GOITER, MULTINODULAR 05/04/2008  . Hyperlipemia   . HYPERTENSION 04/27/2007  . OBESITY 08/01/2010  . UNSPECIFIED ANEMIA 05/04/2008   Mild Anemia    Past Surgical History:  Procedure Laterality Date  . APPENDECTOMY    . BIOPSY THYROID    . COLONOSCOPY  08/10/2006   normal  . LEFT OOPHORECTOMY  1992  . MECKEL DIVERTICULUM EXCISION     Ileal and cecal resection     Social History   Social History  . Marital status: Widowed    Spouse name: N/A  . Number of children: N/A  . Years of education: N/A   Occupational History  . 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 Smoking status: Former Smoker    Quit date: 05/29/1988  . Smokeless tobacco: Never Used  . Alcohol use No  . Drug use: No  . Sexual activity: Not on file   Other Topics Concern  . Not on file   Social History Narrative   Pt does get regular exercise - walking at gym   Former Smoker (college only)    Current Outpatient Prescriptions on File Prior to Visit  Medication Sig Dispense Refill  . Cyanocobalamin (VITAMIN B-12) 2500 MCG SUBL Place 2,500 mcg under the tongue daily. 1 by mouth in the morning    . ferrous sulfate 325 (65 FE) MG tablet Take 325 mg by mouth every evening.     . metFORMIN (GLUCOPHAGE-XR) 500 MG 24 hr tablet TAKE (2) TABLETS TWICE DAILY. 120 tablet 5  . Multiple Vitamin (MULTIVITAMIN) tablet Take 1 tablet by mouth daily.      . ONE TOUCH ULTRA TEST test strip CHECK BLOOD  SUGAR ONCE A DAY. 50 each 2  . PENTASA 500 MG CR capsule TAKE (1) CAPSULE TWICE DAILY. 60 capsule 11  . pioglitazone (ACTOS) 15 MG tablet Take 1 tablet (15 mg total) by mouth daily. 90 tablet 11  . repaglinide (PRANDIN) 0.5 MG tablet Take 1 tablet (0.5 mg total) by mouth 2 (two) times daily before a meal. 60 tablet 11   No current facility-administered medications on file prior to visit.     No Known Allergies  Family History  Problem Relation Age of Onset  . Breast cancer Mother 20  . Kidney disease Mother   . Cancer Mother   . Diabetes Mother   . Skin cancer Father   . Cancer Father   . Diabetes Father   . Colon cancer Other        uncle  . Diabetes Other   . Cancer Sister   . Diabetes Brother   . Cancer Brother 62       tongue cancer  . Stroke Maternal Grandmother   . Heart disease Paternal Grandmother     BP (!) 100/42   Pulse 83   Ht 5' 7"  (1.702 m)   Wt 232 lb (105.2 kg)  SpO2 93%   BMI 36.34 kg/m     Review of Systems Constitutional: Negative for fever.   HENT: Negative for hearing loss.   Eyes: Negative for visual disturbance.  Respiratory: Negative for shortness of breath.   Cardiovascular: Negative for chest pain.  Gastrointestinal: Negative for anal bleeding.  Endocrine: Negative for cold intolerance.  Genitourinary: Negative for hematuria.  Musculoskeletal: Positive for back pain.  Skin: Negative for rash.  Allergic/Immunologic: Positive for mild environmental allergies.  Neurological: Negative for numbness.  Hematological: posittive for easy bruising.  Psychiatric/Behavioral: Negative for dysphoric mood.     Objective:   Physical Exam VS: see vs page GEN: no distress HEAD: head: no deformity eyes: no periorbital swelling, no proptosis external nose and ears are normal mouth: no lesion seen NECK: 2 cm RLP thyroid nodule CHEST WALL: no deformity LUNGS:  Clear to auscultation BREASTS: sees gyn CV: reg rate and rhythm, no murmur ABD:  abdomen is soft, nontender.  no hepatosplenomegaly.  not distended.  no hernia GENITALIA/RECTAL: sees gyn MUSCULOSKELETAL: muscle bulk and strength are grossly normal.  no obvious joint swelling.  gait is normal and steady EXTEMITIES: no edema PULSES: no carotid bruit NEURO:  cn 2-12 grossly intact.   readily moves all 4's.   SKIN:  Normal texture and temperature.  No rash or suspicious lesion is visible.   NODES:  None palpable at the neck PSYCH: alert, well-oriented.  Does not appear anxious nor depressed.    ecg is declined     Assessment & Plan:  Wellness visit today, with problems stable, except as noted.  SEPARATE EVALUATION FOLLOWS--EACH PROBLEM HERE IS NEW, NOT RESPONDING TO TREATMENT, OR POSES SIGNIFICANT RISK TO THE PATIENT'S HEALTH: HISTORY OF THE PRESENT ILLNESS: Pt returns for f/u of diabetes mellitus: DM type: 2 Dx'ed: 8563 Complications: renal insufficiency Therapy: 3 oral meds GDM: never DKA: never Severe hypoglycemia: never Pancreatitis: never Other:she has never taken insulin  Interval history: pt states she feels well in general.  She denies hypoglycemia.  PAST MEDICAL HISTORY Past Medical History:  Diagnosis Date  . ANXIETY 04/27/2007  . CROHN'S DISEASE, SMALL INTESTINE 06/08/2008   ileum, cecum  . DEPRESSION 04/27/2007  . DIABETES MELLITUS, TYPE II 04/27/2007  . Fatty liver   . GERD (gastroesophageal reflux disease)   . GOITER, MULTINODULAR 05/04/2008  . Hyperlipemia   . HYPERTENSION 04/27/2007  . OBESITY 08/01/2010  . UNSPECIFIED ANEMIA 05/04/2008   Mild Anemia    Past Surgical History:  Procedure Laterality Date  . APPENDECTOMY    . BIOPSY THYROID    . COLONOSCOPY  08/10/2006   normal  . LEFT OOPHORECTOMY  1992  . MECKEL DIVERTICULUM EXCISION     Ileal and cecal resection     Social History   Social History  . Marital status: Widowed    Spouse name: N/A  . Number of children: N/A  . Years of education: N/A   Occupational History  .  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 Smoking status: Former Smoker    Quit date: 05/29/1988  . Smokeless tobacco: Never Used  . Alcohol use No  . Drug use: No  . Sexual activity: Not on file   Other Topics Concern  . Not on file   Social History Narrative   Pt does get regular exercise - walking at gym   Former Smoker (college only)    Current Outpatient Prescriptions on File Prior to Visit  Medication Sig Dispense Refill  . Cyanocobalamin (VITAMIN B-12) 2500 MCG SUBL Place 2,500 mcg under the tongue daily. 1 by mouth in the morning    . ferrous sulfate 325 (65 FE) MG tablet Take 325 mg by mouth every evening.     . metFORMIN (GLUCOPHAGE-XR) 500 MG 24 hr tablet TAKE (2) TABLETS TWICE DAILY. 120 tablet 5  . Multiple Vitamin (MULTIVITAMIN) tablet Take 1 tablet by mouth daily.      . ONE TOUCH ULTRA TEST test strip CHECK BLOOD SUGAR ONCE A DAY. 50 each 2  . PENTASA 500 MG CR capsule TAKE (1) CAPSULE TWICE DAILY. 60 capsule 11  . pioglitazone (ACTOS) 15 MG tablet Take 1 tablet (15 mg total) by mouth daily. 90 tablet 11  . repaglinide (PRANDIN) 0.5 MG tablet Take 1 tablet (0.5 mg total) by mouth 2 (two) times daily before a meal. 60 tablet 11   No current facility-administered medications on file prior to visit.     No Known Allergies  Family History  Problem Relation Age of Onset  . Breast cancer Mother 69  . Kidney disease Mother   . Cancer Mother   . Diabetes Mother   . Skin cancer Father   . Cancer Father   . Diabetes Father   . Colon cancer Other        uncle  . Diabetes Other   . Cancer Sister   . Diabetes Brother   . Cancer Brother 62       tongue cancer  . Stroke Maternal Grandmother   . Heart disease Paternal Grandmother     BP (!) 100/42   Pulse 83   Ht 5' 7"  (1.702 m)   Wt 232 lb (105.2 kg)   SpO2 93%   BMI 36.34 kg/m   REVIEW OF SYSTEMS: She has lost more weight, due to her efforts.  PHYSICAL EXAMINATION: VITAL  SIGNS:  See vs page GENERAL: no distress Pulses: dorsalis pedis intact bilat.   MSK: no deformity of the feet CV: no leg edema Skin:  no ulcer on the feet.  normal color and temp on the feet. Neuro: sensation is intact to touch on the feet IMPRESSION: Type 2 DM: she needs increased rx HTN: overcontrolled, due to intentional weight loss PLAN:  Check a1c Reduce hyzaar.

## 2017-02-09 LAB — VITAMIN D 25 HYDROXY (VIT D DEFICIENCY, FRACTURES): VITD: 36.03 ng/mL (ref 30.00–100.00)

## 2017-02-09 LAB — PTH, INTACT AND CALCIUM
Calcium: 10.3 mg/dL (ref 8.6–10.4)
PTH: 89 pg/mL — ABNORMAL HIGH (ref 14–64)

## 2017-02-10 LAB — VITAMIN D 1,25 DIHYDROXY
VITAMIN D3 1, 25 (OH): 24 pg/mL
Vitamin D 1, 25 (OH)2 Total: 24 pg/mL (ref 18–72)

## 2017-02-10 LAB — PROTEIN ELECTROPHORESIS, SERUM
Albumin ELP: 3.9 g/dL (ref 3.8–4.8)
Alpha-1-Globulin: 0.3 g/dL (ref 0.2–0.3)
Alpha-2-Globulin: 1 g/dL — ABNORMAL HIGH (ref 0.5–0.9)
Beta 2: 0.4 g/dL (ref 0.2–0.5)
Beta Globulin: 0.6 g/dL (ref 0.4–0.6)
GAMMA GLOBULIN: 0.8 g/dL (ref 0.8–1.7)
TOTAL PROTEIN, SERUM ELECTROPHOR: 7 g/dL (ref 6.1–8.1)

## 2017-04-22 ENCOUNTER — Other Ambulatory Visit: Payer: Self-pay | Admitting: Endocrinology

## 2017-05-25 ENCOUNTER — Other Ambulatory Visit: Payer: Self-pay | Admitting: Endocrinology

## 2017-06-11 ENCOUNTER — Ambulatory Visit: Payer: Medicare Other | Admitting: Endocrinology

## 2017-06-24 ENCOUNTER — Other Ambulatory Visit: Payer: Self-pay | Admitting: Endocrinology

## 2017-07-08 ENCOUNTER — Encounter: Payer: Self-pay | Admitting: Endocrinology

## 2017-07-08 ENCOUNTER — Ambulatory Visit (INDEPENDENT_AMBULATORY_CARE_PROVIDER_SITE_OTHER): Payer: Medicare Other | Admitting: Endocrinology

## 2017-07-08 VITALS — BP 122/78 | HR 99 | Temp 98.6°F | Resp 16 | Ht 67.0 in | Wt 247.0 lb

## 2017-07-08 DIAGNOSIS — E119 Type 2 diabetes mellitus without complications: Secondary | ICD-10-CM

## 2017-07-08 DIAGNOSIS — E042 Nontoxic multinodular goiter: Secondary | ICD-10-CM | POA: Diagnosis not present

## 2017-07-08 LAB — POCT GLYCOSYLATED HEMOGLOBIN (HGB A1C): Hemoglobin A1C: 5.6

## 2017-07-08 NOTE — Patient Instructions (Addendum)
Please stop taking the repaglinide, and: Please continue the same other diabetes medications. check your blood sugar once a day.  vary the time of day when you check, between before the 3 meals, and at bedtime.  also check if you have symptoms of your blood sugar being too high or too low.  please keep a record of the readings and bring it to your next appointment here (or you can bring the meter itself).  You can write it on any piece of paper.  please call us sooner if your blood sugar goes below 70, or if you have a lot of readings over 200.    Please come back for a follow-up appointment in 3 months.

## 2017-07-08 NOTE — Progress Notes (Signed)
Subjective:    Patient ID: Marie Rhodes, female    DOB: Sep 11, 1936, 81 y.o.   MRN: [id number]  HPI Pt returns for f/u of diabetes mellitus: DM type: 2 Dx'ed: 4696 Complications: renal insufficiency Therapy: 3 oral meds. GDM: never DKA: never Severe hypoglycemia: never Pancreatitis: never Other:she has never taken insulin  Interval history: pt states she feels well in general.  She denies hypoglycemia.  Past Medical History:  Diagnosis Date  . ANXIETY 04/27/2007  . CROHN'S DISEASE, SMALL INTESTINE 06/08/2008   ileum, cecum  . DEPRESSION 04/27/2007  . DIABETES MELLITUS, TYPE II 04/27/2007  . Fatty liver   . GERD (gastroesophageal reflux disease)   . GOITER, MULTINODULAR 05/04/2008  . Hyperlipemia   . HYPERTENSION 04/27/2007  . OBESITY 08/01/2010  . UNSPECIFIED ANEMIA 05/04/2008   Mild Anemia    Past Surgical History:  Procedure Laterality Date  . APPENDECTOMY    . BIOPSY THYROID    . COLONOSCOPY  08/10/2006   normal  . LEFT OOPHORECTOMY  1992  . MECKEL DIVERTICULUM EXCISION     Ileal and cecal resection     Social History   Social History  . Marital status: Widowed    Spouse name: N/A  . Number of children: N/A  . Years of education: N/A   Occupational History  . 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 Smoking status: Former Smoker    Quit date: 05/29/1988  . Smokeless tobacco: Never Used  . Alcohol use No  . Drug use: No  . Sexual activity: Not on file   Other Topics Concern  . Not on file   Social History Narrative   Pt does get regular exercise - walking at gym   Former Smoker (college only)    Current Outpatient Prescriptions on File Prior to Visit  Medication Sig Dispense Refill  . ALPRAZolam (XANAX) 0.25 MG tablet TAKE AS NEEDED FOR ANXIETY. 30 tablet 0  . Cyanocobalamin (VITAMIN B-12) 2500 MCG SUBL Place 2,500 mcg under the tongue daily. 1 by mouth in the morning    . ferrous sulfate 325 (65 FE) MG tablet Take 325  mg by mouth every evening.     Marland Kitchen losartan-hydrochlorothiazide (HYZAAR) 50-12.5 MG tablet Take 1 tablet by mouth daily. 30 tablet 11  . metFORMIN (GLUCOPHAGE-XR) 500 MG 24 hr tablet TAKE (2) TABLETS TWICE DAILY. 120 tablet 0  . Multiple Vitamin (MULTIVITAMIN) tablet Take 1 tablet by mouth daily.      . ONE TOUCH ULTRA TEST test strip CHECK BLOOD SUGAR ONCE A DAY. 50 each 2  . PENTASA 500 MG CR capsule TAKE (1) CAPSULE TWICE DAILY. 60 capsule 11  . pioglitazone (ACTOS) 15 MG tablet Take 1 tablet (15 mg total) by mouth daily. 90 tablet 11   No current facility-administered medications on file prior to visit.     No Known Allergies  Family History  Problem Relation Age of Onset  . Breast cancer Mother 72  . Kidney disease Mother   . Cancer Mother   . Diabetes Mother   . Skin cancer Father   . Cancer Father   . Diabetes Father   . Colon cancer Other        uncle  . Diabetes Other   . Cancer Sister   . Diabetes Brother   . Cancer Brother 62       tongue cancer  . Stroke Maternal Grandmother   . Heart disease  Paternal Grandmother     BP 122/78 (BP Location: Left Wrist, Patient Position: Sitting, Cuff Size: Normal)   Pulse 99   Temp 98.6 F (37 C) (Oral)   Resp 16   Ht 5' 7"  (1.702 m)   Wt 247 lb (112 kg)   SpO2 97%   BMI 38.69 kg/m   Review of Systems She has regained weight.      Objective:   Physical Exam VITAL SIGNS:  See vs page GENERAL: no distress Pulses: foot pulses are intact bilaterally.   MSK: no deformity of the feet or ankles.  CV: no edema of the legs or ankles Skin:  no ulcer on the feet or ankles.  normal color and temp on the feet and ankles.  Neuro: sensation is intact to touch on the feet and ankles.    Lab Results  Component Value Date   HGBA1C 6.0 02/08/2017      Assessment & Plan:  Type 2 DM: overcontrolled.   Patient Instructions  Please stop taking the repaglinide, and: Please continue the same other diabetes medications. check your  blood sugar once a day.  vary the time of day when you check, between before the 3 meals, and at bedtime.  also check if you have symptoms of your blood sugar being too high or too low.  please keep a record of the readings and bring it to your next appointment here (or you can bring the meter itself).  You can write it on any piece of paper.  please call us sooner if your blood sugar goes below 70, or if you have a lot of readings over 200.    Please come back for a follow-up appointment in 3 months.

## 2017-07-29 ENCOUNTER — Other Ambulatory Visit: Payer: Self-pay | Admitting: Endocrinology

## 2017-09-01 ENCOUNTER — Other Ambulatory Visit: Payer: Self-pay | Admitting: Endocrinology

## 2017-10-01 ENCOUNTER — Other Ambulatory Visit: Payer: Self-pay | Admitting: Endocrinology

## 2017-10-08 ENCOUNTER — Ambulatory Visit
Admission: RE | Admit: 2017-10-08 | Discharge: 2017-10-08 | Disposition: A | Discharge status: Home / Self Care | Payer: Medicare Other | Source: Ambulatory Visit | Attending: Endocrinology | Admitting: Endocrinology

## 2017-10-08 ENCOUNTER — Ambulatory Visit: Payer: Medicare Other | Admitting: Endocrinology

## 2017-10-08 ENCOUNTER — Encounter: Payer: Self-pay | Admitting: Endocrinology

## 2017-10-08 VITALS — BP 98/62 | HR 108 | Wt 253.2 lb

## 2017-10-08 DIAGNOSIS — Z23 Encounter for immunization: Secondary | ICD-10-CM | POA: Diagnosis not present

## 2017-10-08 DIAGNOSIS — E119 Type 2 diabetes mellitus without complications: Secondary | ICD-10-CM | POA: Diagnosis not present

## 2017-10-08 DIAGNOSIS — Z780 Asymptomatic menopausal state: Secondary | ICD-10-CM | POA: Diagnosis not present

## 2017-10-08 DIAGNOSIS — Z0000 Encounter for general adult medical examination without abnormal findings: Secondary | ICD-10-CM | POA: Diagnosis not present

## 2017-10-08 DIAGNOSIS — M7989 Other specified soft tissue disorders: Secondary | ICD-10-CM

## 2017-10-08 LAB — POCT GLYCOSYLATED HEMOGLOBIN (HGB A1C): Hemoglobin A1C: 6.8

## 2017-10-08 MED ORDER — LOSARTAN POTASSIUM-HCTZ 50-12.5 MG PO TABS: 0.5000 | ORAL_TABLET | Freq: Every day | ORAL | 11 refills | Status: DC

## 2017-10-08 NOTE — Progress Notes (Signed)
Subjective:    Patient ID: Marie Rhodes, female    DOB: May 21, 1936, 82 y.o.   MRN: [id number]  HPI Pt returns for f/u of diabetes mellitus: DM type: 2 Dx'ed: 8527 Complications: renal insufficiency Therapy: 2 oral meds. GDM: never DKA: never Severe hypoglycemia: never Pancreatitis: never Other:she has never taken insulin  Interval history: pt states she feels well in general.  She denies hypoglycemia.  She takes meds as rx'ed.  Pt states 10 years ago, she fx left shoulder.  She has developed swelling at the area.  Past Medical History:  Diagnosis Date  . ANXIETY 04/27/2007  . CROHN'S DISEASE, SMALL INTESTINE 06/08/2008   ileum, cecum  . DEPRESSION 04/27/2007  . DIABETES MELLITUS, TYPE II 04/27/2007  . Fatty liver   . GERD (gastroesophageal reflux disease)   . GOITER, MULTINODULAR 05/04/2008  . Hyperlipemia   . HYPERTENSION 04/27/2007  . OBESITY 08/01/2010  . UNSPECIFIED ANEMIA 05/04/2008   Mild Anemia    Past Surgical History:  Procedure Laterality Date  . APPENDECTOMY    . BIOPSY THYROID    . COLONOSCOPY  08/10/2006   normal  . LEFT OOPHORECTOMY  1992  . MECKEL DIVERTICULUM EXCISION     Ileal and cecal resection     Social History   Socioeconomic History  . Marital status: Widowed    Spouse name: Not on file  . Number of children: Not on file  . Years of education: Not on file  . Highest education level: Not on file  Social Needs  . Financial resource strain: Not on file  . Food insecurity - worry: Not on file  . Food insecurity - inability: Not on file  . Transportation needs - medical: Not on file  . Transportation needs - non-medical: Not on file  Occupational History  . 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 Smoking status: Former Smoker    Last attempt to quit: 05/29/1988    Years since quitting: 29.3  . Smokeless tobacco: Never Used  Substance and Sexual Activity  . Alcohol use: No  . Drug use: No  . Sexual  activity: Not on file  Other Topics Concern  . Not on file  Social History Narrative   Pt does get regular exercise - walking at gym   Former Smoker (college only)    Current Outpatient Medications on File Prior to Visit  Medication Sig Dispense Refill  . ALPRAZolam (XANAX) 0.25 MG tablet TAKE AS NEEDED FOR ANXIETY. 30 tablet 0  . Cyanocobalamin (VITAMIN B-12) 2500 MCG SUBL Place 2,500 mcg under the tongue daily. 1 by mouth in the morning    . ferrous sulfate 325 (65 FE) MG tablet Take 325 mg by mouth every evening.     . metFORMIN (GLUCOPHAGE-XR) 500 MG 24 hr tablet TAKE (2) TABLETS TWICE DAILY. 120 tablet 0  . Multiple Vitamin (MULTIVITAMIN) tablet Take 1 tablet by mouth daily.      . ONE TOUCH ULTRA TEST test strip CHECK BLOOD SUGAR ONCE A DAY. 50 each 2  . PENTASA 500 MG CR capsule TAKE (1) CAPSULE TWICE DAILY. 60 capsule 11  . pioglitazone (ACTOS) 15 MG tablet Take 1 tablet (15 mg total) by mouth daily. 90 tablet 11   No current facility-administered medications on file prior to visit.     No Known Allergies  Family History  Problem Relation Age of Onset  . Breast cancer Mother 80  . Kidney disease Mother   .  Cancer Mother   . Diabetes Mother   . Skin cancer Father   . Cancer Father   . Diabetes Father   . Colon cancer Other        uncle  . Diabetes Other   . Cancer Sister   . Diabetes Brother   . Cancer Brother 62       tongue cancer  . Stroke Maternal Grandmother   . Heart disease Paternal Grandmother     BP 98/62 (BP Location: Left Wrist, Patient Position: Sitting, Cuff Size: Normal)   Pulse (!) 108   Wt 253 lb 3.2 oz (114.9 kg)   SpO2 95%   BMI 39.66 kg/m   Review of Systems Denies rash and leg swelling.     Objective:   Physical Exam VITAL SIGNS:  See vs page GENERAL: no distress Left shoulder: slight swelling anteriorly Pulses: dorsalis pedis intact bilat.   MSK: no deformity of the feet CV: no leg edema Skin:  no ulcer on the feet.  normal  color and temp on the feet. Neuro: sensation is intact to touch on the feet   Lab Results  Component Value Date   HGBA1C 6.8 10/08/2017       Assessment & Plan:  Type 2 DM, with renal insuff: well-controlled.  Please continue the same medication HTN: overcontrolled. redude hyzaar Shoulder pain: new.  Ref sports med   Subjective:   Patient here for Medicare annual wellness visit and management of other chronic and acute problems.     Risk factors: advanced age    51 of Physicians Providing Medical Care to Patient:  See "snapshot"   Activities of Daily Living: In your present state of health, do you have any difficulty performing the following activities (lives with granddaughter)?:  Preparing food and eating?: No  Bathing yourself: No  Getting dressed: No  Using the toilet:No  Moving around from place to place: No  In the past year have you fallen or had a near fall?: No    Home Safety: Has smoke detector and wears seat belts. No firearms. No excess sun exposure.  Opioid Use: none   Diet and Exercise  Current exercise habits: pt says fair Dietary issues discussed: pt reports a fairly healthy diet   Depression Screen  Q1: Over the past two weeks, have you felt down, depressed or hopeless? no  Q2: Over the past two weeks, have you felt little interest or pleasure in doing things? no   The following portions of the patient's history were reviewed and updated as appropriate: allergies, current medications, past family history, past medical history, past social history, past surgical history and problem list.   Review of Systems  No change in chronic hearing and visual loss.   Objective:   Vision:  Advertising account executive, so he declines VA today Hearing: grossly normal Body mass index:  See vs page Msk: pt easily and quickly performs "get-up-and-go" from a sitting position.  Cognitive Impairment Assessment: cognition, memory and judgment appear normal.  remembers 3/3  at 5 minutes.  excellent recall.  can easily read and write a sentence.  alert and oriented x 3   Assessment:   Medicare wellness utd on preventive parameters    Plan:   During the course of the visit the patient was educated and counseled about appropriate screening and preventive services including:        Fall prevention is advised today  Screening mammography is UTD, per pt Bone densitometry screening is ordered  today Diabetes screening is not needed, as she has DM Nutrition counseling is offered  advanced directives/end of life addressed today:  see healthcare directives hyperlink  Vaccines are updated as needed  Patient Instructions (the written plan) was given to the patient.

## 2017-10-08 NOTE — Patient Instructions (Addendum)
Please continue the same medications for diabetes.  Please reduce the losartan-HCTZ to 1/2 pill per day.   Please see a specialist for your shoulder.  you will receive a phone call, about a day and time for an appointment.  check your blood sugar once a day.  vary the time of day when you check, between before the 3 meals, and at bedtime.  also check if you have symptoms of your blood sugar being too high or too low.  please keep a record of the readings and bring it to your next appointment here (or you can bring the meter itself).  You can write it on any piece of paper.  please call us sooner if your blood sugar goes below 70, or if you have a lot of readings over 200.    Please come back for a regular physical appointment in 4 months (must be after 02/08/18).

## 2017-10-08 NOTE — Progress Notes (Signed)
we discussed code status.  pt requests full code, but would not want to be started or maintained on artificial life-support measures if there was not a reasonable chance of recovery 

## 2017-10-10 DIAGNOSIS — Z23 Encounter for immunization: Secondary | ICD-10-CM | POA: Diagnosis not present

## 2017-10-14 ENCOUNTER — Ambulatory Visit: Payer: Medicare Other | Admitting: Sports Medicine

## 2017-10-14 ENCOUNTER — Ambulatory Visit (INDEPENDENT_AMBULATORY_CARE_PROVIDER_SITE_OTHER)
Admission: RE | Admit: 2017-10-14 | Discharge: 2017-10-14 | Disposition: A | Discharge status: Home / Self Care | Payer: Medicare Other | Source: Ambulatory Visit | Attending: Endocrinology | Admitting: Endocrinology

## 2017-10-14 VITALS — BP 140/76 | Ht 67.0 in | Wt 253.0 lb

## 2017-10-14 DIAGNOSIS — Z780 Asymptomatic menopausal state: Secondary | ICD-10-CM | POA: Diagnosis not present

## 2017-10-14 DIAGNOSIS — M799 Soft tissue disorder, unspecified: Secondary | ICD-10-CM

## 2017-10-14 DIAGNOSIS — M7989 Other specified soft tissue disorders: Secondary | ICD-10-CM

## 2017-10-14 NOTE — Progress Notes (Signed)
Subjective:    Patient ID: Marie Rhodes, female    DOB: 1936-04-23, 82 y.o.   MRN: [id number]  HPI  Krystian Ferrentino is a 82 year old female with DM II, hypertension and Crohn's disease who presents with enlargement of her left shoulder.  She states that 1-2 months ago (sometime between Thanksgiving and Christmas), she stepped out of the shower and noticed that her left shoulder was significantly larger. She denies any pain.  No prior episodes. No limitations in movement. She states that the change occurred suddenly, and the shoulder has remained the same in size since (has not increased or decreased in size).  No numbness or tingling in her left arm.  No other swelling in her left arm noted since the enlargement of her shoulder. No erythema over swelling.  No fevers. No alleviating or exacerbating symptoms.   Of note, patient fractured her left shoulder after a fall in 2007.  Did not need any surgery at that time.   Review of Systems   Pertinent positives as given in HPI. Patient has a very strong family history of cancer. No personal history of cancer.  Past Medical History:  Diagnosis Date  . ANXIETY 04/27/2007  . CROHN'S DISEASE, SMALL INTESTINE 06/08/2008  . DEPRESSION 04/27/2007  . DIABETES MELLITUS, TYPE II 04/27/2007  . Fatty liver   . GERD (gastroesophageal reflux disease)   . GOITER, MULTINODULAR 05/04/2008  . Hyperlipemia   . HYPERTENSION 04/27/2007  . OBESITY 08/01/2010  . UNSPECIFIED ANEMIA 05/04/2008   Past Surgical History: appendectomy (1967), bowel resection 2/2 Crohn's disease (1992)  Current Outpatient Medications on File Prior to Visit  Medication Sig Dispense Refill  . ALPRAZolam (XANAX) 0.25 MG tablet TAKE AS NEEDED FOR ANXIETY. 30 tablet 0  . Cyanocobalamin (VITAMIN B-12) 2500 MCG SUBL Place 2,500 mcg under the tongue daily. 1 by mouth in the morning    . ferrous sulfate 325 (65 FE) MG tablet Take 325 mg by mouth every evening.     Marland Kitchen losartan-hydrochlorothiazide  (HYZAAR) 50-12.5 MG tablet Take 0.5 tablets by mouth daily. 15 tablet 11  . metFORMIN (GLUCOPHAGE-XR) 500 MG 24 hr tablet TAKE (2) TABLETS TWICE DAILY. 120 tablet 0  . Multiple Vitamin (MULTIVITAMIN) tablet Take 1 tablet by mouth daily.      . ONE TOUCH ULTRA TEST test strip CHECK BLOOD SUGAR ONCE A DAY. 50 each 2  . PENTASA 500 MG CR capsule TAKE (1) CAPSULE TWICE DAILY. 60 capsule 11  . pioglitazone (ACTOS) 15 MG tablet Take 1 tablet (15 mg total) by mouth daily. 90 tablet 11    Allergies: none  Family history: 2 siblings, mother and father with history of malignancy    Objective:   Physical Exam  General: alert, interactive, pleasant 82 yo female. No acute distress HEENT: normocephalic, atraumatic. Sclera white. Moist mucus membranes Cardiac: normal S1 and S2. Regular rate and rhythm.  Pulmonary: normal work of breathing. Clear bilaterally  Extremities: Warm and well-perfused. Brisk capillary refill Skin: no rashes, lesions, breakdown visible  Left shoulder: There is a rather large soft tissue mass which involves the majority of the superior shoulder with extension down into the anterior shoulder as well. It is not fluctuant. It is not tender to palpation. Patient has full shoulder range of motion. Good strength. Neurovascularly intact distally.  MSK ultrasound of the left shoulder was performed. Limited images were obtained. Soft tissue mass was imaged in both long and short axis. It appears to be a lipoma.  Minimal neovascularity seen. No cyst.     Assessment & Plan:   82 year old female with DM II, hypertension and Crohn's disease with painless enlargement of her left shoulder over past 1-2 months with sudden onset. Non-tender on exam and ROM not limited. POC US performed.  Appears to be a soft tissue mass with normal joint and muscle tissue surrounding it.  Loculated and likely lipoma because of minimal blood flow seen on doppler; however, cannot rule out soft tissue sarcoma.   Discussed with radiology; will order MRI with contrast for patient to better define mass.  Soft tissue mass of left shoulder:  - MRI with contrast ordered  [facility] PGY-3  Patient seen and evaluated with the resident. I agree with the above plan of care. Patient has a rather large soft tissue mass on the superior aspect of her left shoulder. Patient states that this came on suddenly and has not changed in size. It is not painful. Ultrasound findings suggest that this may be a lipoma but this is not definitive. We need to pursue further diagnostic imaging, specifically an MRI of her left shoulder with and without contrast, to evaluate further and rule out malignancy. Phone follow-up with those findings when available. No restrictions on activity.

## 2017-10-15 ENCOUNTER — Encounter: Payer: Self-pay | Admitting: Sports Medicine

## 2017-10-18 MED ORDER — DIAZEPAM 10 MG PO TABS: ORAL_TABLET | ORAL | 0 refills | Status: DC

## 2017-10-18 NOTE — Addendum Note (Signed)
Addended by: Jolinda Croak E on: 10/18/2017 02:25 PM   Modules accepted: Orders

## 2017-10-22 ENCOUNTER — Inpatient Hospital Stay: Admission: RE | Admit: 2017-10-22 | Payer: Medicare Other | Source: Ambulatory Visit

## 2017-10-28 ENCOUNTER — Ambulatory Visit
Admission: RE | Admit: 2017-10-28 | Discharge: 2017-10-28 | Disposition: A | Discharge status: Home / Self Care | Payer: Medicare Other | Source: Ambulatory Visit | Attending: Sports Medicine | Admitting: Sports Medicine

## 2017-10-28 DIAGNOSIS — M7989 Other specified soft tissue disorders: Secondary | ICD-10-CM

## 2017-10-28 MED ORDER — GADOBENATE DIMEGLUMINE 529 MG/ML IV SOLN
10.0000 mL | Freq: Once | INTRAVENOUS | Status: AC | PRN
  Administered 2017-10-28: 10 mL via INTRAVENOUS

## 2017-11-01 ENCOUNTER — Telehealth: Payer: Self-pay | Admitting: Sports Medicine

## 2017-11-01 NOTE — Telephone Encounter (Signed)
  I spoke with the patient on the phone today after reviewing the MRI of her shoulder done with and without contrast. The soft tissue mass at the superior aspect of her shoulder is asymmetric subcutaneous fat but no discrete encapsulated lipoma is seen. No evidence of cancer. Patient is reassured of these findings. I recommend no further workup or treatment at this time. Follow-up as needed.

## 2017-11-08 ENCOUNTER — Other Ambulatory Visit: Payer: Self-pay | Admitting: Endocrinology

## 2017-11-08 DIAGNOSIS — E119 Type 2 diabetes mellitus without complications: Secondary | ICD-10-CM

## 2017-12-06 ENCOUNTER — Other Ambulatory Visit: Payer: Self-pay | Admitting: Endocrinology

## 2017-12-06 DIAGNOSIS — E119 Type 2 diabetes mellitus without complications: Secondary | ICD-10-CM

## 2018-01-03 ENCOUNTER — Other Ambulatory Visit: Payer: Self-pay | Admitting: Internal Medicine

## 2018-01-03 ENCOUNTER — Other Ambulatory Visit: Payer: Self-pay | Admitting: Endocrinology

## 2018-01-03 DIAGNOSIS — E119 Type 2 diabetes mellitus without complications: Secondary | ICD-10-CM

## 2018-02-10 ENCOUNTER — Other Ambulatory Visit: Payer: Self-pay | Admitting: Endocrinology

## 2018-02-17 ENCOUNTER — Telehealth: Payer: Self-pay | Admitting: Internal Medicine

## 2018-02-17 ENCOUNTER — Other Ambulatory Visit (INDEPENDENT_AMBULATORY_CARE_PROVIDER_SITE_OTHER): Payer: Medicare Other

## 2018-02-17 ENCOUNTER — Ambulatory Visit: Payer: Medicare Other | Admitting: Endocrinology

## 2018-02-17 VITALS — BP 138/78 | HR 100 | Wt 249.2 lb

## 2018-02-17 DIAGNOSIS — E538 Deficiency of other specified B group vitamins: Secondary | ICD-10-CM | POA: Diagnosis not present

## 2018-02-17 DIAGNOSIS — E7800 Pure hypercholesterolemia, unspecified: Secondary | ICD-10-CM

## 2018-02-17 DIAGNOSIS — Z0000 Encounter for general adult medical examination without abnormal findings: Secondary | ICD-10-CM

## 2018-02-17 DIAGNOSIS — E611 Iron deficiency: Secondary | ICD-10-CM | POA: Diagnosis not present

## 2018-02-17 DIAGNOSIS — E042 Nontoxic multinodular goiter: Secondary | ICD-10-CM

## 2018-02-17 DIAGNOSIS — R9431 Abnormal electrocardiogram [ECG] [EKG]: Secondary | ICD-10-CM | POA: Diagnosis not present

## 2018-02-17 DIAGNOSIS — I10 Essential (primary) hypertension: Secondary | ICD-10-CM

## 2018-02-17 DIAGNOSIS — E119 Type 2 diabetes mellitus without complications: Secondary | ICD-10-CM | POA: Diagnosis not present

## 2018-02-17 LAB — LIPID PANEL
CHOLESTEROL: 180 mg/dL (ref 0–200)
HDL: 48.6 mg/dL (ref >39.00)
NonHDL: 131.57
TRIGLYCERIDES: 346 mg/dL — AB (ref 0.0–149.0)
Total CHOL/HDL Ratio: 4
VLDL: 69.2 mg/dL — AB (ref 0.0–40.0)

## 2018-02-17 LAB — VITAMIN B12: Vitamin B-12: 1168 pg/mL — ABNORMAL HIGH (ref 211–911)

## 2018-02-17 LAB — CBC WITH DIFFERENTIAL/PLATELET
Basophils Absolute: 0.1 10*3/uL (ref 0.0–0.1)
Basophils Relative: 0.6 % (ref 0.0–3.0)
EOS ABS: 0.3 10*3/uL (ref 0.0–0.7)
Eosinophils Relative: 4 % (ref 0.0–5.0)
HCT: 37.7 % (ref 36.0–46.0)
HEMOGLOBIN: 12.6 g/dL (ref 12.0–15.0)
Lymphocytes Relative: 28.4 % (ref 12.0–46.0)
Lymphs Abs: 2.5 10*3/uL (ref 0.7–4.0)
MCHC: 33.5 g/dL (ref 30.0–36.0)
MCV: 89 fl (ref 78.0–100.0)
MONO ABS: 0.5 10*3/uL (ref 0.1–1.0)
Monocytes Relative: 6.1 % (ref 3.0–12.0)
NEUTROS PCT: 60.9 % (ref 43.0–77.0)
Neutro Abs: 5.3 10*3/uL (ref 1.4–7.7)
Platelets: 263 10*3/uL (ref 150.0–400.0)
RBC: 4.23 Mil/uL (ref 3.87–5.11)
RDW: 13.2 % (ref 11.5–15.5)
WBC: 8.7 10*3/uL (ref 4.0–10.5)

## 2018-02-17 LAB — URINALYSIS, ROUTINE W REFLEX MICROSCOPIC
Bilirubin Urine: NEGATIVE
KETONES UR: NEGATIVE
LEUKOCYTES UA: NEGATIVE
Nitrite: NEGATIVE
Specific Gravity, Urine: 1.025 (ref 1.000–1.030)
Total Protein, Urine: NEGATIVE
URINE GLUCOSE: NEGATIVE
UROBILINOGEN UA: 0.2 (ref 0.0–1.0)
pH: 5.5 (ref 5.0–8.0)

## 2018-02-17 LAB — VITAMIN D 25 HYDROXY (VIT D DEFICIENCY, FRACTURES): VITD: 27.85 ng/mL — ABNORMAL LOW (ref 30.00–100.00)

## 2018-02-17 LAB — BASIC METABOLIC PANEL
BUN: 31 mg/dL — ABNORMAL HIGH (ref 6–23)
CHLORIDE: 104 meq/L (ref 96–112)
CO2: 28 meq/L (ref 19–32)
Calcium: 10.5 mg/dL (ref 8.4–10.5)
Creatinine, Ser: 1.25 mg/dL — ABNORMAL HIGH (ref 0.40–1.20)
GFR: 43.86 mL/min — AB (ref >60.00)
GLUCOSE: 146 mg/dL — AB (ref 70–99)
POTASSIUM: 3.9 meq/L (ref 3.5–5.1)
SODIUM: 139 meq/L (ref 135–145)

## 2018-02-17 LAB — IBC PANEL
IRON: 66 ug/dL (ref 42–145)
Saturation Ratios: 13.5 % — ABNORMAL LOW (ref 20.0–50.0)
TRANSFERRIN: 348 mg/dL (ref 212.0–360.0)

## 2018-02-17 LAB — POCT GLYCOSYLATED HEMOGLOBIN (HGB A1C): Hemoglobin A1C: 6.7 % — AB (ref 4.0–5.6)

## 2018-02-17 LAB — LDL CHOLESTEROL, DIRECT: Direct LDL: 78 mg/dL

## 2018-02-17 LAB — HEPATIC FUNCTION PANEL
ALT: 31 U/L (ref 0–35)
AST: 24 U/L (ref 0–37)
Albumin: 4.2 g/dL (ref 3.5–5.2)
Alkaline Phosphatase: 86 U/L (ref 39–117)
BILIRUBIN DIRECT: 0.1 mg/dL (ref 0.0–0.3)
BILIRUBIN TOTAL: 0.4 mg/dL (ref 0.2–1.2)
TOTAL PROTEIN: 7.2 g/dL (ref 6.0–8.3)

## 2018-02-17 LAB — TSH: TSH: 1.77 u[IU]/mL (ref 0.35–4.50)

## 2018-02-17 LAB — MICROALBUMIN / CREATININE URINE RATIO
Creatinine,U: 152.3 mg/dL
MICROALB/CREAT RATIO: 1 mg/g (ref 0.0–30.0)
Microalb, Ur: 1.5 mg/dL (ref 0.0–1.9)

## 2018-02-17 MED ORDER — PIOGLITAZONE HCL 15 MG PO TABS: ORAL_TABLET | ORAL | 11 refills | Status: DC

## 2018-02-17 MED ORDER — MESALAMINE ER 500 MG PO CPCR: ORAL_CAPSULE | ORAL | 1 refills | Status: DC

## 2018-02-17 MED ORDER — METFORMIN HCL ER 500 MG PO TB24: ORAL_TABLET | ORAL | 11 refills | Status: DC

## 2018-02-17 NOTE — Patient Instructions (Signed)
Please consider these measures for your health:  minimize alcohol.  Do not use tobacco products.  Have a colonoscopy at least every 10 years from age 80.  Women should have an annual mammogram from age 73.  Keep firearms safely stored.  Always use seat belts.  have working smoke alarms in your home.  See an eye doctor and dentist regularly.  Never drive under the influence of alcohol or drugs (including prescription drugs).  Those with fair skin should take precautions against the sun, and should carefully examine their skin once per month, for any new or changed moles. blood tests are requested for you today.  We'll let you know about the results. Please come back for a follow-up appointment in 4 months.

## 2018-02-17 NOTE — Progress Notes (Signed)
Subjective:    Patient ID: Marie Rhodes, female    DOB: Nov 28, 1935, 82 y.o.   MRN: [id number]  HPI Pt is here for regular wellness examination, and is feeling pretty well in general, and says chronic med probs are stable, except as noted below  Past Medical History:  Diagnosis Date  . ANXIETY 04/27/2007  . CROHN'S DISEASE, SMALL INTESTINE 06/08/2008   ileum, cecum  . DEPRESSION 04/27/2007  . DIABETES MELLITUS, TYPE II 04/27/2007  . Fatty liver   . GERD (gastroesophageal reflux disease)   . GOITER, MULTINODULAR 05/04/2008  . Hyperlipemia   . HYPERTENSION 04/27/2007  . OBESITY 08/01/2010  . UNSPECIFIED ANEMIA 05/04/2008   Mild Anemia    Past Surgical History:  Procedure Laterality Date  . APPENDECTOMY    . BIOPSY THYROID    . COLONOSCOPY  08/10/2006   normal  . LEFT OOPHORECTOMY  1992  . MECKEL DIVERTICULUM EXCISION     Ileal and cecal resection     Social History   Socioeconomic History  . Marital status: Widowed    Spouse name: Not on file  . Number of children: Not on file  . Years of education: Not on file  . Highest education level: Not on file  Occupational History  . 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 Financial resource strain: Not on file  . Food insecurity:    Worry: Not on file    Inability: Not on file  . Transportation needs:    Medical: Not on file    Non-medical: Not on file  Tobacco Use  . Smoking status: Former Smoker    Last attempt to quit: 05/29/1988    Years since quitting: 29.7  . Smokeless tobacco: Never Used  Substance and Sexual Activity  . Alcohol use: No  . Drug use: No  . Sexual activity: Not on file  Lifestyle  . Physical activity:    Days per week: Not on file    Minutes per session: Not on file  . Stress: Not on file  Relationships  . Social connections:    Talks on phone: Not on file    Gets together: Not on file    Attends religious service: Not on file    Active member of club or  organization: Not on file    Attends meetings of clubs or organizations: Not on file    Relationship status: Not on file  . Intimate partner violence:    Fear of current or ex partner: Not on file    Emotionally abused: Not on file    Physically abused: Not on file    Forced sexual activity: Not on file  Other Topics Concern  . Not on file  Social History Narrative   Pt does get regular exercise - walking at gym   Former Smoker (college only)    Current Outpatient Medications on File Prior to Visit  Medication Sig Dispense Refill  . ALPRAZolam (XANAX) 0.25 MG tablet TAKE AS NEEDED FOR ANXIETY. 30 tablet 0  . Cyanocobalamin (VITAMIN B-12) 2500 MCG SUBL Place 2,500 mcg under the tongue daily. 1 by mouth in the morning    . ferrous sulfate 325 (65 FE) MG tablet Take 325 mg by mouth every evening.     Marland Kitchen losartan-hydrochlorothiazide (HYZAAR) 50-12.5 MG tablet Take 0.5 tablets by mouth daily. 15 tablet 11  . Multiple Vitamin (MULTIVITAMIN) tablet Take 1 tablet by mouth daily.      Marland Kitchen  ONE TOUCH ULTRA TEST test strip CHECK BLOOD SUGAR ONCE A DAY. 50 each 2   No current facility-administered medications on file prior to visit.     No Known Allergies  Family History  Problem Relation Age of Onset  . Breast cancer Mother 41  . Kidney disease Mother   . Cancer Mother   . Diabetes Mother   . Skin cancer Father   . Cancer Father   . Diabetes Father   . Colon cancer Other        uncle  . Diabetes Other   . Cancer Sister   . Diabetes Brother   . Cancer Brother 62       tongue cancer  . Stroke Maternal Grandmother   . Heart disease Paternal Grandmother     BP 138/78   Pulse 100   Wt 249 lb 3.2 oz (113 kg)   SpO2 93%   BMI 39.03 kg/m    Review of Systems Denies fever, fatigue, visual loss, chest pain, sob, back pain, depression, cold intolerance,hematuria, syncope, numbness, easy bruising, and rash. No change in chronic mild hearing loss or rhinorrhea.      Objective:    Physical Exam VS: see vs page GEN: no distress HEAD: head: no deformity eyes: no periorbital swelling, no proptosis external nose and ears are normal mouth: no lesion seen NECK: small multinodular goiter is again noted CHEST WALL: no deformity LUNGS: clear to auscultation CV: reg rate and rhythm, no murmur ABD: abdomen is soft, nontender.  no hepatosplenomegaly.  not distended.  no hernia MUSCULOSKELETAL: muscle bulk and strength are grossly normal.  no obvious joint swelling.  gait is normal and steady EXTEMITIES: no deformity.  no ulcer on the feet.  feet are of normal color and temp.  no edema. PULSES: dorsalis pedis intact bilat.  no carotid bruit NEURO:  cn 2-12 grossly intact, except for hearing loss.   readily moves all 4's.  sensation is intact to touch on the feet.   SKIN:  Normal texture and temperature.  No rash or suspicious lesion is visible.   NODES:  None palpable at the neck.   PSYCH: alert, well-oriented.  Does not appear anxious nor depressed.     I personally reviewed electrocardiogram tracing (today): Indication:  Impression: NSR.  No MI.  Right bundle branch block with left axis -bifascicular block.  Compared to: no significant change  Lab Results  Component Value Date   HGBA1C 6.7 (A) 02/17/2018   Lab Results  Component Value Date   WBC 7.3 02/08/2017   HGB 12.5 02/08/2017   HCT 37.3 02/08/2017   MCV 90.3 02/08/2017   PLT 266.0 02/08/2017       Assessment & Plan:  Wellness visit today, with problems stable, except as noted.  Patient Instructions  Please consider these measures for your health:  minimize alcohol.  Do not use tobacco products.  Have a colonoscopy at least every 10 years from age 76.  Women should have an annual mammogram from age 104.  Keep firearms safely stored.  Always use seat belts.  have working smoke alarms in your home.  See an eye doctor and dentist regularly.  Never drive under the influence of alcohol or drugs (including  prescription drugs).  Those with fair skin should take precautions against the sun, and should carefully examine their skin once per month, for any new or changed moles. blood tests are requested for you today.  We'll let you know about the results.  Please come back for a follow-up appointment in 4 months.     SEPARATE EVALUATION FOLLOWS--EACH PROBLEM HERE IS NEW, NOT RESPONDING TO TREATMENT, OR POSES SIGNIFICANT RISK TO THE PATIENT'S HEALTH: HISTORY OF THE PRESENT ILLNESS: She takes B-12, 1000 mg PO BID PAST MEDICAL HISTORY Past Medical History:  Diagnosis Date  . ANXIETY 04/27/2007  . CROHN'S DISEASE, SMALL INTESTINE 06/08/2008   ileum, cecum  . DEPRESSION 04/27/2007  . DIABETES MELLITUS, TYPE II 04/27/2007  . Fatty liver   . GERD (gastroesophageal reflux disease)   . GOITER, MULTINODULAR 05/04/2008  . Hyperlipemia   . HYPERTENSION 04/27/2007  . OBESITY 08/01/2010  . UNSPECIFIED ANEMIA 05/04/2008   Mild Anemia    Past Surgical History:  Procedure Laterality Date  . APPENDECTOMY    . BIOPSY THYROID    . COLONOSCOPY  08/10/2006   normal  . LEFT OOPHORECTOMY  1992  . MECKEL DIVERTICULUM EXCISION     Ileal and cecal resection     Social History   Socioeconomic History  . Marital status: Widowed    Spouse name: Not on file  . Number of children: Not on file  . Years of education: Not on file  . Highest education level: Not on file  Occupational History  . 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 Financial resource strain: Not on file  . Food insecurity:    Worry: Not on file    Inability: Not on file  . Transportation needs:    Medical: Not on file    Non-medical: Not on file  Tobacco Use  . Smoking status: Former Smoker    Last attempt to quit: 05/29/1988    Years since quitting: 29.7  . Smokeless tobacco: Never Used  Substance and Sexual Activity  . Alcohol use: No  . Drug use: No  . Sexual activity: Not on file  Lifestyle  .  Physical activity:    Days per week: Not on file    Minutes per session: Not on file  . Stress: Not on file  Relationships  . Social connections:    Talks on phone: Not on file    Gets together: Not on file    Attends religious service: Not on file    Active member of club or organization: Not on file    Attends meetings of clubs or organizations: Not on file    Relationship status: Not on file  . Intimate partner violence:    Fear of current or ex partner: Not on file    Emotionally abused: Not on file    Physically abused: Not on file    Forced sexual activity: Not on file  Other Topics Concern  . Not on file  Social History Narrative   Pt does get regular exercise - walking at gym   Former Smoker (college only)    Current Outpatient Medications on File Prior to Visit  Medication Sig Dispense Refill  . ALPRAZolam (XANAX) 0.25 MG tablet TAKE AS NEEDED FOR ANXIETY. 30 tablet 0  . Cyanocobalamin (VITAMIN B-12) 2500 MCG SUBL Place 2,500 mcg under the tongue daily. 1 by mouth in the morning    . ferrous sulfate 325 (65 FE) MG tablet Take 325 mg by mouth every evening.     Marland Kitchen losartan-hydrochlorothiazide (HYZAAR) 50-12.5 MG tablet Take 0.5 tablets by mouth daily. 15 tablet 11  . Multiple Vitamin (MULTIVITAMIN) tablet Take 1 tablet by mouth daily.      Marland Kitchen  ONE TOUCH ULTRA TEST test strip CHECK BLOOD SUGAR ONCE A DAY. 50 each 2   No current facility-administered medications on file prior to visit.     No Known Allergies  Family History  Problem Relation Age of Onset  . Breast cancer Mother 22  . Kidney disease Mother   . Cancer Mother   . Diabetes Mother   . Skin cancer Father   . Cancer Father   . Diabetes Father   . Colon cancer Other        uncle  . Diabetes Other   . Cancer Sister   . Diabetes Brother   . Cancer Brother 62       tongue cancer  . Stroke Maternal Grandmother   . Heart disease Paternal Grandmother     BP 138/78   Pulse 100   Wt 249 lb 3.2 oz (113  kg)   SpO2 93%   BMI 39.03 kg/m   REVIEW OF SYSTEMS: Denies BRBPR PHYSICAL EXAMINATION: VITAL SIGNS:  See vs page GENERAL: no distress  LAB/XRAY RESULTS: Lab Results  Component Value Date   IRON 66 02/17/2018   FERRITIN 33.8 11/11/2015   Lab Results  Component Value Date   CREATININE 1.25 (H) 02/17/2018   BUN 31 (H) 02/17/2018   NA 139 02/17/2018   K 3.9 02/17/2018   CL 104 02/17/2018   CO2 28 02/17/2018   Lab Results  Component Value Date   PTH 61 02/17/2018   CALCIUM 10.5 02/17/2018   CALCIUM 10.7 (H) 02/17/2018   CAION 1.27 12/06/2015   Lab Results  Component Value Date   WBC 8.7 02/17/2018   HGB 12.6 02/17/2018   HCT 37.7 02/17/2018   MCV 89.0 02/17/2018   PLT 263.0 02/17/2018   Vit-D=28 B-12 is elevated IMPRESSION: Renal failure: persistent B-12 def: overreplaced Hypercalcemia: in this setting, we need normal vit-D to interpret PTH fe-deficiency: well-replaced PLAN:  Reduce B-12 to qd rx of renal failure is good BP lipids, and glucose. Take ergocalciferol, 2000 units/d Continue fe 1/d

## 2018-02-17 NOTE — Telephone Encounter (Signed)
  Refilled patient's pentasa as requested and we will see her at her July appointment.

## 2018-02-18 LAB — PTH, INTACT AND CALCIUM
CALCIUM: 10.7 mg/dL — AB (ref 8.6–10.4)
PTH: 61 pg/mL (ref 14–64)

## 2018-03-24 ENCOUNTER — Telehealth: Payer: Self-pay | Admitting: Internal Medicine

## 2018-03-24 MED ORDER — MESALAMINE ER 500 MG PO CPCR: ORAL_CAPSULE | ORAL | 0 refills | Status: DC

## 2018-03-24 NOTE — Telephone Encounter (Signed)
Patient states she only has a 3 day supply of medication pentasa left and would like enough called in to get her to her appt on 7.12.19.

## 2018-03-24 NOTE — Telephone Encounter (Signed)
Patient informed that rx sent in to [facility]. She said thank you.

## 2018-03-26 ENCOUNTER — Emergency Department (HOSPITAL_COMMUNITY): Payer: Medicare Other

## 2018-03-26 ENCOUNTER — Other Ambulatory Visit: Payer: Self-pay

## 2018-03-26 ENCOUNTER — Encounter (HOSPITAL_COMMUNITY): Payer: Self-pay | Admitting: Emergency Medicine

## 2018-03-26 ENCOUNTER — Emergency Department (HOSPITAL_COMMUNITY)
Admission: EM | Admit: 2018-03-26 | Discharge: 2018-03-26 | Disposition: A | Discharge status: Home / Self Care | Payer: Medicare Other | Attending: Emergency Medicine | Admitting: Emergency Medicine

## 2018-03-26 DIAGNOSIS — Z87891 Personal history of nicotine dependence: Secondary | ICD-10-CM | POA: Diagnosis not present

## 2018-03-26 DIAGNOSIS — Y939 Activity, unspecified: Secondary | ICD-10-CM | POA: Insufficient documentation

## 2018-03-26 DIAGNOSIS — E119 Type 2 diabetes mellitus without complications: Secondary | ICD-10-CM | POA: Diagnosis not present

## 2018-03-26 DIAGNOSIS — Y929 Unspecified place or not applicable: Secondary | ICD-10-CM | POA: Insufficient documentation

## 2018-03-26 DIAGNOSIS — S199XXA Unspecified injury of neck, initial encounter: Secondary | ICD-10-CM | POA: Diagnosis present

## 2018-03-26 DIAGNOSIS — X500XXA Overexertion from strenuous movement or load, initial encounter: Secondary | ICD-10-CM | POA: Diagnosis not present

## 2018-03-26 DIAGNOSIS — M7918 Myalgia, other site: Secondary | ICD-10-CM | POA: Diagnosis not present

## 2018-03-26 DIAGNOSIS — Y999 Unspecified external cause status: Secondary | ICD-10-CM | POA: Diagnosis not present

## 2018-03-26 DIAGNOSIS — S161XXA Strain of muscle, fascia and tendon at neck level, initial encounter: Secondary | ICD-10-CM | POA: Diagnosis not present

## 2018-03-26 DIAGNOSIS — Z7984 Long term (current) use of oral hypoglycemic drugs: Secondary | ICD-10-CM | POA: Diagnosis not present

## 2018-03-26 LAB — HEPATIC FUNCTION PANEL
ALT: 28 U/L (ref 0–44)
AST: 21 U/L (ref 15–41)
Albumin: 3.6 g/dL (ref 3.5–5.0)
Alkaline Phosphatase: 84 U/L (ref 38–126)
BILIRUBIN DIRECT: 0.1 mg/dL (ref 0.0–0.2)
BILIRUBIN INDIRECT: 0.7 mg/dL (ref 0.3–0.9)
Total Bilirubin: 0.8 mg/dL (ref 0.3–1.2)
Total Protein: 6.8 g/dL (ref 6.5–8.1)

## 2018-03-26 LAB — BASIC METABOLIC PANEL
Anion gap: 11 (ref 5–15)
BUN: 20 mg/dL (ref 8–23)
CALCIUM: 10.1 mg/dL (ref 8.9–10.3)
CO2: 26 mmol/L (ref 22–32)
CREATININE: 1.35 mg/dL — AB (ref 0.44–1.00)
Chloride: 100 mmol/L (ref 98–111)
GFR calc non Af Amer: 36 mL/min — ABNORMAL LOW (ref >60)
GFR, EST AFRICAN AMERICAN: 42 mL/min — AB (ref >60)
Glucose, Bld: 161 mg/dL — ABNORMAL HIGH (ref 70–99)
Potassium: 4 mmol/L (ref 3.5–5.1)
SODIUM: 137 mmol/L (ref 135–145)

## 2018-03-26 LAB — D-DIMER, QUANTITATIVE (NOT AT ARMC): D DIMER QUANT: 0.38 ug{FEU}/mL (ref 0.00–0.50)

## 2018-03-26 LAB — CBC
HCT: 41.2 % (ref 36.0–46.0)
Hemoglobin: 13.1 g/dL (ref 12.0–15.0)
MCH: 29.6 pg (ref 26.0–34.0)
MCHC: 31.8 g/dL (ref 30.0–36.0)
MCV: 93 fL (ref 78.0–100.0)
Platelets: 235 10*3/uL (ref 150–400)
RBC: 4.43 MIL/uL (ref 3.87–5.11)
RDW: 12.4 % (ref 11.5–15.5)
WBC: 9.6 10*3/uL (ref 4.0–10.5)

## 2018-03-26 LAB — TROPONIN I: Troponin I: <0.03 ng/mL (ref <0.03)

## 2018-03-26 LAB — I-STAT TROPONIN, ED: TROPONIN I, POC: 0 ng/mL (ref 0.00–0.08)

## 2018-03-26 LAB — LIPASE, BLOOD: Lipase: 44 U/L (ref 11–51)

## 2018-03-26 MED ORDER — METHOCARBAMOL 500 MG PO TABS
500.0000 mg | ORAL_TABLET | Freq: Once | ORAL | Status: AC
  Administered 2018-03-26: 500 mg via ORAL
  Filled 2018-03-26: qty 1

## 2018-03-26 MED ORDER — METHOCARBAMOL 500 MG PO TABS: 250.0000 mg | ORAL_TABLET | Freq: Two times a day (BID) | ORAL | 0 refills | Status: DC | PRN

## 2018-03-26 MED ORDER — TRAMADOL HCL 50 MG PO TABS: 50.0000 mg | ORAL_TABLET | Freq: Four times a day (QID) | ORAL | 0 refills | Status: DC | PRN

## 2018-03-26 MED ORDER — TRAMADOL HCL 50 MG PO TABS
50.0000 mg | ORAL_TABLET | Freq: Once | ORAL | Status: AC
  Administered 2018-03-26: 50 mg via ORAL
  Filled 2018-03-26: qty 1

## 2018-03-26 NOTE — Discharge Instructions (Addendum)
For your pain: - Take Tylenol/Acetaminophen 1000 mg (two extra-strength tablets) every 8 hours - FIRST, take the Robaxin (muscle relaxant); this can help with muscle tightness but is also very sedating - If you notice that the Robaxin is NOT helping, STOP taking it - You can then try the Tramadol, which Is a pain medicine and is also sedating - Do not take both the Robaxin and Tramadol at the same time - Gentle heat and heating pads can help

## 2018-03-26 NOTE — ED Provider Notes (Signed)
[facility] EMERGENCY DEPARTMENT Provider Note   CSN: [id number] Arrival date & time: 03/26/18  1150     History   Chief Complaint Chief Complaint  Patient presents with  . Neck Pain  . Arm Pain    HPI Marie Rhodes is a 82 y.o. female.  HPI   82 year old female with history of hypertension, obesity, diabetes, here with neck and back pain.  The patient states that her symptoms started approximately 48 hours ago.  She worked at the [employer] for 2 days straight, which is more than usual for her.  When she returned home, she she then took a neighbor to the grocery store.  She states she carried the bags for her neighbor.  When she returned home, she noticed gradual onset of a throbbing, severe, upper neck, back, and upper chest pain.  She states it was severe, worse with any kind of movement, and not associated with any shortness of breath, nausea, diaphoresis, or other complaints.  She says that the pain got significantly worse with any movement, but when she got up and started moving through the pain, her pain then gradually resolved and did not return until she was still again.  She describes it as a throbbing pain in her bilateral shoulders that occasionally radiated towards her chest and back.  She denies any falls.  Denies any upper lower extremity weakness or numbness.  She notified her family today that she was still having pain, though improved, and they became concerned that it could be angina.  She has no history of coronary disease.  No shortness of breath.  The pain is been constant for at least the last 48 hours.  Past Medical History:  Diagnosis Date  . ANXIETY 04/27/2007  . CROHN'S DISEASE, SMALL INTESTINE 06/08/2008   ileum, cecum  . DEPRESSION 04/27/2007  . DIABETES MELLITUS, TYPE II 04/27/2007  . Fatty liver   . GERD (gastroesophageal reflux disease)   . GOITER, MULTINODULAR 05/04/2008  . Hyperlipemia   . HYPERTENSION 04/27/2007  . OBESITY 08/01/2010  .  UNSPECIFIED ANEMIA 05/04/2008   Mild Anemia    Patient Active Problem List   Diagnosis Date Noted  . Iron deficiency 02/17/2018  . B12 deficiency 02/17/2018  . Shoulder swelling 10/08/2017  . Asymptomatic menopausal state 10/08/2017  . Hypercalcemia 02/08/2017  . Arthralgia 01/22/2015  . Diabetes ([place]) 03/05/2014  . UTI (urinary tract infection) 03/13/2013  . Abnormal ECG 09/12/2012  . Retinal detachment 09/12/2012  . Cough 06/28/2012  . Menopause 09/10/2011  . Renal insufficiency 12/22/2010  . OBESITY 08/01/2010  . HYPERCHOLESTEROLEMIA 06/24/2010  . NASH (nonalcoholic steatohepatitis) 06/23/2009  . CROHN'S DISEASE, SMALL INTESTINE 06/08/2008  . GOITER, MULTINODULAR 05/04/2008  . GERD 01/19/2008  . ANXIETY 04/27/2007  . DEPRESSION 04/27/2007  . Essential hypertension 04/27/2007    Past Surgical History:  Procedure Laterality Date  . APPENDECTOMY    . BIOPSY THYROID    . COLONOSCOPY  08/10/2006   normal  . LEFT OOPHORECTOMY  1992  . MECKEL DIVERTICULUM EXCISION     Ileal and cecal resection      OB History   None      Home Medications    Prior to Admission medications   Medication Sig Start Date End Date Taking? Authorizing Provider  ALPRAZolam (XANAX) 0.25 MG tablet TAKE AS NEEDED FOR ANXIETY. 02/08/17   Renato Shin, MD  Cyanocobalamin (VITAMIN B-12) 2500 MCG SUBL Place 2,500 mcg under the tongue daily. 1 by mouth in  the morning    [provider]  ferrous sulfate 325 (65 FE) MG tablet Take 325 mg by mouth every evening.     [provider]  losartan-hydrochlorothiazide (HYZAAR) 50-12.5 MG tablet Take 0.5 tablets by mouth daily. 10/08/17   Renato Shin, MD  mesalamine (PENTASA) 500 MG CR capsule TAKE (1) CAPSULE TWICE DAILY. 03/24/18   Gatha Mayer, MD  metFORMIN (GLUCOPHAGE-XR) 500 MG 24 hr tablet TAKE (2) TABLETS TWICE DAILY. 02/17/18   Renato Shin, MD  Multiple Vitamin (MULTIVITAMIN) tablet Take 1 tablet by mouth daily.      [provider]  ONE TOUCH ULTRA TEST test strip CHECK BLOOD SUGAR ONCE A DAY. 01/07/16   Renato Shin, MD  pioglitazone (ACTOS) 15 MG tablet TAKE 1 TABLET EACH DAY. 02/17/18   Renato Shin, MD    Family History Family History  Problem Relation Age of Onset  . Breast cancer Mother 20  . Kidney disease Mother   . Cancer Mother   . Diabetes Mother   . Skin cancer Father   . Cancer Father   . Diabetes Father   . Cancer Sister   . Diabetes Brother   . Cancer Brother 62       tongue cancer  . Stroke Maternal Grandmother   . Heart disease Paternal Grandmother   . Colon cancer Other        uncle  . Diabetes Other     Social History Social History   Tobacco Use  . Smoking status: Former Smoker    Last attempt to quit: 05/29/1988    Years since quitting: 29.8  . Smokeless tobacco: Never Used  Substance Use Topics  . Alcohol use: No  . Drug use: No     Allergies   Patient has no known allergies.   Review of Systems Review of Systems  Constitutional: Negative for chills, fatigue and fever.  HENT: Negative for congestion and rhinorrhea.   Eyes: Negative for visual disturbance.  Respiratory: Negative for cough, shortness of breath and wheezing.   Cardiovascular: Negative for chest pain and leg swelling.  Gastrointestinal: Negative for abdominal pain, diarrhea, nausea and vomiting.  Genitourinary: Negative for dysuria and flank pain.  Musculoskeletal: Positive for arthralgias, myalgias, neck pain and neck stiffness.  Skin: Negative for rash and wound.  Allergic/Immunologic: Negative for immunocompromised state.  Neurological: Negative for syncope, weakness and headaches.  All other systems reviewed and are negative.    Physical Exam Updated Vital Signs BP (!) 155/70   Pulse 92   Temp 98.6 F (37 C) (Oral)   Resp 20   Ht 5' 7"  (1.702 m)   Wt 113.4 kg (250 lb)   SpO2 95%   BMI 39.16 kg/m   Physical Exam  Constitutional: She is oriented to person, place, and  time. She appears well-developed and well-nourished. No distress.  HENT:  Head: Normocephalic and atraumatic.  Eyes: Conjunctivae are normal.  Neck: Neck supple.  Cardiovascular: Normal rate, regular rhythm and normal heart sounds. Exam reveals no friction rub.  No murmur heard. Pulmonary/Chest: Effort normal and breath sounds normal. No respiratory distress. She has no wheezes. She has no rales.  Abdominal: She exhibits no distension.  Musculoskeletal: She exhibits no edema.       Back:  Neurological: She is alert and oriented to person, place, and time. She exhibits normal muscle tone.  Strength 5 out of 5 bilateral upper and lower extremities.  Normal sensation light touch.  Skin: Skin is  warm. Capillary refill takes less than 2 seconds.  Psychiatric: She has a normal mood and affect.  Nursing note and vitals reviewed.    ED Treatments / Results  Labs (all labs ordered are listed, but only abnormal results are displayed) Labs Reviewed  BASIC METABOLIC PANEL - Abnormal; Notable for the following components:      Result Value   Glucose, Bld 161 (*)    Creatinine, Ser 1.35 (*)    GFR calc non Af Amer 36 (*)    GFR calc Af Amer 42 (*)    All other components within normal limits  CBC  TROPONIN I  D-DIMER, QUANTITATIVE (NOT AT [facility])  HEPATIC FUNCTION PANEL  LIPASE, BLOOD  I-STAT TROPONIN, ED    EKG EKG Interpretation  Date/Time:  Saturday March 26 2018 12:22:34 EDT Ventricular Rate:  94 PR Interval:  148 QRS Duration: 126 QT Interval:  384 QTC Calculation: 480 R Axis:   -84 Text Interpretation:  Normal sinus rhythm Right bundle branch block Left anterior fascicular block Possible Lateral infarct , age undetermined Abnormal ECG No significant change since last tracing Confirmed by Duffy Bruce [phone]) on 03/26/2018 2:57:23 PM   Radiology Dg Chest 2 View  Result Date: 03/26/2018 CLINICAL DATA:  Patient to ED c/o mid back pain radiating to bilateral shoulders, neck,  and across chest x 2 days. Patient states it has been constant, but is less intense than when episode began Thursday EXAM: CHEST - 2 VIEW COMPARISON:  12/06/2015 FINDINGS: Improvement in the central pulmonary vascular congestion since prior study. No confluent airspace disease. Heart size upper limits normal. Aortic Atherosclerosis (ICD10-170.0). No effusion. Visualized bones unremarkable. IMPRESSION: No acute cardiopulmonary disease. Electronically Signed   By: Lucrezia Europe M.D.   On: 03/26/2018 13:14    Procedures Procedures (including critical care time)  Medications Ordered in ED Medications  traMADol (ULTRAM) tablet 50 mg (50 mg Oral Given 03/26/18 1405)  methocarbamol (ROBAXIN) tablet 500 mg (500 mg Oral Given 03/26/18 1406)     Initial Impression / Assessment and Plan / ED Course  I have reviewed the triage vital signs and the nursing notes.  Pertinent labs & imaging results that were available during my care of the patient were reviewed by me and considered in my medical decision making (see chart for details).  Clinical Course as of Mar 26 1528  Sat Mar 26, 2018  5953 Basic metabolic panel(!) [CI]    Clinical Course User Index [CI] Duffy Bruce, MD    82 year old female here with upper neck and shoulder pain, occasionally radiating towards her anterior chest.  Pain is highly atypical in nature and began after she worked all day then lifted groceries for her neighbor.  The pain was sharply positional and actually significantly improved with exertion,.  This is all consistent with likely musculoskeletal pain due to overexertion.  She strongly denies any associated nausea, vomiting, or other concerning symptoms.  Her EKG here is nonischemic and unchanged from her baseline.  Her formal troponin is negative despite constant symptoms for over 48 hours, making ACS unlikely.  D-dimer is negative I do not suspect PE or dissection.  Her chest x-ray is clear.  She feels markedly improved after  muscle relaxants here in the ED.  Will treat her for likely musculoskeletal pain with good return precautions and PCP follow-up.  Final Clinical Impressions(s) / ED Diagnoses   Final diagnoses:  Musculoskeletal pain  Strain of neck muscle, initial encounter    ED Discharge  Orders    None       Duffy Bruce, MD 03/26/18 1529

## 2018-03-26 NOTE — ED Triage Notes (Signed)
Patient to ED c/o mid back pain radiating to bilateral shoulders, neck, and across chest x 2 days. Patient states it has been constant, but is less intense than when episode began Thursday. She is worried she may have had an angina attack, but reports no history of same. Denies SOB or dizziness. Resp e/u, skin warm/dry.

## 2018-03-26 NOTE — ED Notes (Signed)
Patient transported to X-ray 

## 2018-04-08 ENCOUNTER — Ambulatory Visit: Payer: Medicare Other | Admitting: Internal Medicine

## 2018-04-08 ENCOUNTER — Encounter: Payer: Self-pay | Admitting: Internal Medicine

## 2018-04-08 VITALS — BP 126/70 | HR 100 | Ht 66.0 in | Wt 250.4 lb

## 2018-04-08 DIAGNOSIS — E559 Vitamin D deficiency, unspecified: Secondary | ICD-10-CM

## 2018-04-08 DIAGNOSIS — E538 Deficiency of other specified B group vitamins: Secondary | ICD-10-CM | POA: Diagnosis not present

## 2018-04-08 DIAGNOSIS — K5000 Crohn's disease of small intestine without complications: Secondary | ICD-10-CM | POA: Diagnosis not present

## 2018-04-08 DIAGNOSIS — E611 Iron deficiency: Secondary | ICD-10-CM

## 2018-04-08 HISTORY — DX: Vitamin D deficiency, unspecified: E55.9

## 2018-04-08 MED ORDER — VITAMIN D 50 MCG (2000 UT) PO TABS: 2000.0000 [IU] | ORAL_TABLET | Freq: Every day | ORAL | Status: DC

## 2018-04-08 MED ORDER — FERROUS SULFATE 325 (65 FE) MG PO TABS: 325.0000 mg | ORAL_TABLET | Freq: Two times a day (BID) | ORAL | 3 refills | Status: AC

## 2018-04-08 MED ORDER — MESALAMINE ER 500 MG PO CPCR: ORAL_CAPSULE | ORAL | 3 refills | Status: DC

## 2018-04-08 MED ORDER — ALPRAZOLAM 0.25 MG PO TABS: ORAL_TABLET | ORAL | 0 refills | Status: DC

## 2018-04-08 NOTE — Assessment & Plan Note (Signed)
2000 IU a d ay

## 2018-04-08 NOTE — Progress Notes (Signed)
Marie Rhodes 81 y.o. [birth year] [id number]  Assessment & Plan:   CROHN'S DISEASE, SMALL INTESTINE Stable on low-dose Pentasa.  She may actually have some mild active inflammation is causing iron deficiency anemia but clinically she is well overall otherwise I would not do anything different.  Refill Pentasa. RTC 1 year  Iron deficiency Bid fe so4  Vitamin D deficiency 2000 IU a d ay  B12 deficiency Reduce to 1000 mg/day  I appreciate the opportunity to care for this patient. CC: Renato Shin, MD     Subjective:   Chief Complaint:  HPI Marie Rhodes comes in for her routine follow-up.  I last saw her in 2017 at which time she had a colonoscopy demonstrating a normal post ileocecectomy exam.  She has Crohn's disease of the small intestine and she is maintained on low-dose Pentasa.  She is not anemic but her numbers still show iron deficiency based upon an iron and a TIBC panel she had recently with Dr. Loanne Drilling.  She does not notice any bleeding.  She has questions about her primary care labs.  We reviewed how she is slightly low in vitamin D.  We reviewed that she has excess vitamin B12 in her body.  Dr. Loanne Drilling suggested 2000 units of vitamin D daily which she has not done yet and suggested she stay on iron therapy.  Also suggested reducing vitamin B12 to 1000 mcg daily not twice daily and she has done that.  She takes a Centrum vitamin and thinks there was iron in it but now she thinks because it is a silver it may not have iron like her old one did.  She does take 325 mg of iron daily.  He is not anemic. No Known Allergies Current Meds  Medication Sig  . ALPRAZolam (XANAX) 0.25 MG tablet TAKE AS NEEDED FOR ANXIETY.  Marland Kitchen losartan-hydrochlorothiazide (HYZAAR) 50-12.5 MG tablet Take 0.5 tablets by mouth daily.  . mesalamine (PENTASA) 500 MG CR capsule TAKE (1) CAPSULE TWICE DAILY.  . metFORMIN (GLUCOPHAGE-XR) 500 MG 24 hr tablet TAKE (2) TABLETS TWICE DAILY. (Patient taking  differently: Take 1,000 mg by mouth 2 (two) times daily. )  . Multiple Vitamin (MULTIVITAMIN) tablet Take 1 tablet by mouth at bedtime.   . ONE TOUCH ULTRA TEST test strip CHECK BLOOD SUGAR ONCE A DAY.  . pioglitazone (ACTOS) 15 MG tablet TAKE 1 TABLET EACH DAY. (Patient taking differently: Take 15 mg by mouth every morning. )  . vitamin B-12 (CYANOCOBALAMIN) 1000 MCG tablet Take 1,000 mcg by mouth daily.  . [DISCONTINUED] ALPRAZolam (XANAX) 0.25 MG tablet TAKE AS NEEDED FOR ANXIETY.  . [DISCONTINUED] ferrous sulfate 325 (65 FE) MG tablet Take 325 mg by mouth every evening.   . [DISCONTINUED] mesalamine (PENTASA) 500 MG CR capsule TAKE (1) CAPSULE TWICE DAILY. (Patient taking differently: Take 500 mg by mouth 2 (two) times daily. )   Past Medical History:  Diagnosis Date  . ANXIETY 04/27/2007  . CROHN'S DISEASE, SMALL INTESTINE 06/08/2008   ileum, cecum  . DEPRESSION 04/27/2007  . DIABETES MELLITUS, TYPE II 04/27/2007  . Fatty liver   . Fatty tumor    left shoulder  . GERD (gastroesophageal reflux disease)   . GOITER, MULTINODULAR 05/04/2008  . Hyperlipemia   . HYPERTENSION 04/27/2007  . OBESITY 08/01/2010  . UNSPECIFIED ANEMIA 05/04/2008   Mild Anemia  . Vitamin D deficiency 04/08/2018   Past Surgical History:  Procedure Laterality Date  . APPENDECTOMY    . BIOPSY THYROID    .  COLONOSCOPY  08/10/2006   normal  . LEFT OOPHORECTOMY  1992  . MECKEL DIVERTICULUM EXCISION     Ileal and cecal resection    Social History   Social History Narrative   Pt does get regular exercise - walking at gym   Former Smoker (college only)   family history includes Breast cancer (age of onset: 39) in her mother; Colon cancer in her paternal uncle; Diabetes in her brother, father, and mother; Heart disease in her paternal grandmother; Kidney cancer in her mother; Kidney disease in her mother; Melanoma in her sister; Skin cancer in her father; Stroke in her maternal grandmother; Tongue cancer (age of  onset: 55) in her brother.   Review of Systems As per HPI  Objective:   Physical Exam BP 126/70 (BP Location: Left Wrist, Patient Position: Sitting, Cuff Size: Normal)   Pulse 100   Ht 5' 6"  (1.676 m) Comment: height measured without shoes  Wt 250 lb 6 oz (113.6 kg)   BMI 40.41 kg/m  Obese elderly woman no acute distress lungs clear normal heart sounds abdomen is obese and nontender alert and oriented x3

## 2018-04-08 NOTE — Assessment & Plan Note (Signed)
Bid fe so4

## 2018-04-08 NOTE — Assessment & Plan Note (Signed)
Reduce to 1000 mg/day

## 2018-04-08 NOTE — Assessment & Plan Note (Addendum)
Stable on low-dose Pentasa.  She may actually have some mild active inflammation is causing iron deficiency anemia but clinically she is well overall otherwise I would not do anything different.  Refill Pentasa. RTC 1 year

## 2018-04-08 NOTE — Patient Instructions (Signed)
We have sent the following medications to your pharmacy for you to pick up at your convenience: Xanax, Pentasa   Take your vitamin D and iron as discussed.   Follow up in a year or sooner if needed.    I appreciate the opportunity to care for you. Silvano Rusk, MD, [facility]

## 2018-06-06 LAB — HM DIABETES EYE EXAM

## 2018-08-15 ENCOUNTER — Encounter: Payer: Self-pay | Admitting: Endocrinology

## 2018-08-15 ENCOUNTER — Ambulatory Visit: Payer: Medicare Other | Admitting: Endocrinology

## 2018-08-15 VITALS — BP 132/86 | HR 123 | Ht 66.0 in | Wt 239.6 lb

## 2018-08-15 DIAGNOSIS — N289 Disorder of kidney and ureter, unspecified: Secondary | ICD-10-CM | POA: Diagnosis not present

## 2018-08-15 DIAGNOSIS — E119 Type 2 diabetes mellitus without complications: Secondary | ICD-10-CM | POA: Diagnosis not present

## 2018-08-15 DIAGNOSIS — E559 Vitamin D deficiency, unspecified: Secondary | ICD-10-CM | POA: Diagnosis not present

## 2018-08-15 DIAGNOSIS — Z23 Encounter for immunization: Secondary | ICD-10-CM

## 2018-08-15 LAB — BASIC METABOLIC PANEL
BUN: 26 mg/dL — AB (ref 6–23)
CO2: 27 meq/L (ref 19–32)
Calcium: 10.5 mg/dL (ref 8.4–10.5)
Chloride: 103 mEq/L (ref 96–112)
Creatinine, Ser: 1.36 mg/dL — ABNORMAL HIGH (ref 0.40–1.20)
GFR: 39.74 mL/min — ABNORMAL LOW (ref >60.00)
Glucose, Bld: 120 mg/dL — ABNORMAL HIGH (ref 70–99)
POTASSIUM: 3.8 meq/L (ref 3.5–5.1)
Sodium: 139 mEq/L (ref 135–145)

## 2018-08-15 LAB — POCT GLYCOSYLATED HEMOGLOBIN (HGB A1C): Hemoglobin A1C: 5.9 % — AB (ref 4.0–5.6)

## 2018-08-15 LAB — VITAMIN D 25 HYDROXY (VIT D DEFICIENCY, FRACTURES): VITD: 31.64 ng/mL (ref 30.00–100.00)

## 2018-08-15 MED ORDER — METFORMIN HCL ER 500 MG PO TB24
1000.0000 mg | ORAL_TABLET | Freq: Every day | ORAL | 3 refills | Status: DC
Start: 2018-08-15 — End: 2019-02-13

## 2018-08-15 NOTE — Patient Instructions (Addendum)
Please reduce the metformin to 2 pills per day.   blood tests are requested for you today.  We'll let you know about the results.  Best wishes with your new primary care provider.

## 2018-08-15 NOTE — Progress Notes (Signed)
Subjective:    Patient ID: Marie Rhodes, female    DOB: 10/03/1935, 82 y.o.   MRN: [id number]  HPI Pt returns for f/u of diabetes mellitus: DM type: 2 Dx'ed: 6979 Complications: renal insufficiency Therapy: 2 oral meds. GDM: never DKA: never Severe hypoglycemia: never Pancreatitis: never Other: she has never taken insulin  Interval history: pt states she feels well in general.  She denies hypoglycemia.  She takes meds as rx'ed.  Past Medical History:  Diagnosis Date  . ANXIETY 04/27/2007  . CROHN'S DISEASE, SMALL INTESTINE 06/08/2008   ileum, cecum  . DEPRESSION 04/27/2007  . DIABETES MELLITUS, TYPE II 04/27/2007  . Fatty liver   . Fatty tumor    left shoulder  . GERD (gastroesophageal reflux disease)   . GOITER, MULTINODULAR 05/04/2008  . Hyperlipemia   . HYPERTENSION 04/27/2007  . OBESITY 08/01/2010  . UNSPECIFIED ANEMIA 05/04/2008   Mild Anemia  . Vitamin D deficiency 04/08/2018    Past Surgical History:  Procedure Laterality Date  . APPENDECTOMY    . BIOPSY THYROID    . COLONOSCOPY  08/10/2006   normal  . LEFT OOPHORECTOMY  1992  . MECKEL DIVERTICULUM EXCISION     Ileal and cecal resection     Social History   Socioeconomic History  . Marital status: Widowed    Spouse name: Not on file  . Number of children: Not on file  . Years of education: Not on file  . Highest education level: Not on file  Occupational History  . 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 Financial resource strain: Not on file  . Food insecurity:    Worry: Not on file    Inability: Not on file  . Transportation needs:    Medical: Not on file    Non-medical: Not on file  Tobacco Use  . Smoking status: Former Smoker    Last attempt to quit: 05/29/1988    Years since quitting: 30.2  . Smokeless tobacco: Never Used  Substance and Sexual Activity  . Alcohol use: No  . Drug use: No  . Sexual activity: Not on file  Lifestyle  . Physical activity:   Days per week: Not on file    Minutes per session: Not on file  . Stress: Not on file  Relationships  . Social connections:    Talks on phone: Not on file    Gets together: Not on file    Attends religious service: Not on file    Active member of club or organization: Not on file    Attends meetings of clubs or organizations: Not on file    Relationship status: Not on file  . Intimate partner violence:    Fear of current or ex partner: Not on file    Emotionally abused: Not on file    Physically abused: Not on file    Forced sexual activity: Not on file  Other Topics Concern  . Not on file  Social History Narrative   Pt does get regular exercise - walking at gym   Former Smoker (college only)    Current Outpatient Medications on File Prior to Visit  Medication Sig Dispense Refill  . ALPRAZolam (XANAX) 0.25 MG tablet TAKE AS NEEDED FOR ANXIETY. 30 tablet 0  . Cholecalciferol (VITAMIN D) 2000 units tablet Take 1 tablet (2,000 Units total) by mouth daily.    . ferrous sulfate 325 (65 FE) MG tablet Take 1 tablet (325  mg total) by mouth 2 (two) times daily.  3  . losartan-hydrochlorothiazide (HYZAAR) 50-12.5 MG tablet Take 0.5 tablets by mouth daily. 15 tablet 11  . mesalamine (PENTASA) 500 MG CR capsule TAKE (1) CAPSULE TWICE DAILY. 180 capsule 3  . Multiple Vitamin (MULTIVITAMIN) tablet Take 1 tablet by mouth at bedtime.     . ONE TOUCH ULTRA TEST test strip CHECK BLOOD SUGAR ONCE A DAY. 50 each 2  . pioglitazone (ACTOS) 15 MG tablet TAKE 1 TABLET EACH DAY. (Patient taking differently: Take 15 mg by mouth every morning. ) 30 tablet 11  . vitamin B-12 (CYANOCOBALAMIN) 1000 MCG tablet Take 1,000 mcg by mouth daily.     No current facility-administered medications on file prior to visit.     No Known Allergies  Family History  Problem Relation Age of Onset  . Breast cancer Mother 49  . Kidney disease Mother   . Diabetes Mother   . Kidney cancer Mother   . Skin cancer Father          mets  . Diabetes Father   . Melanoma Sister   . Diabetes Brother   . Tongue cancer Brother 46  . Stroke Maternal Grandmother   . Heart disease Paternal Grandmother   . Colon cancer Paternal Uncle     BP 132/86 (BP Location: Right Arm, Patient Position: Sitting, Cuff Size: Large)   Pulse (!) 123   Ht 5' 6"  (1.676 m)   Wt 239 lb 9.6 oz (108.7 kg)   SpO2 92%   BMI 38.67 kg/m     Review of Systems She has lost a few lbs.      Objective:   Physical Exam VITAL SIGNS:  See vs page GENERAL: no distress Pulses: dorsalis pedis intact bilat.   MSK: no deformity of the feet CV: no leg edema Skin:  no ulcer on the feet.  normal color and temp on the feet. Neuro: sensation is intact to touch on the feet  Lab Results  Component Value Date   CREATININE 1.35 (H) 03/26/2018   BUN 20 03/26/2018   NA 137 03/26/2018   K 4.0 03/26/2018   CL 100 03/26/2018   CO2 26 03/26/2018    Lab Results  Component Value Date   HGBA1C 5.9 (A) 08/15/2018       Assessment & Plan:  Type 2 DM: overcontrolled.  Renal insuff: in this setting, metformin os chosen for reduction  Patient Instructions  Please reduce the metformin to 2 pills per day.   blood tests are requested for you today.  We'll let you know about the results.  Best wishes with your new primary care provider.

## 2018-09-28 DIAGNOSIS — U071 COVID-19: Secondary | ICD-10-CM

## 2018-09-28 HISTORY — DX: COVID-19: U07.1

## 2018-10-11 ENCOUNTER — Other Ambulatory Visit: Payer: Self-pay | Admitting: Endocrinology

## 2018-10-11 NOTE — Telephone Encounter (Signed)
Please refill x 3 months Further refills would have to be considered by new PCP   

## 2018-10-11 NOTE — Telephone Encounter (Signed)
Please advise if refill is appropriate 

## 2019-01-14 ENCOUNTER — Other Ambulatory Visit: Payer: Self-pay | Admitting: Endocrinology

## 2019-01-14 NOTE — Telephone Encounter (Signed)
Please refill x 3 months Further refills would have to be considered by new PCP   

## 2019-02-10 ENCOUNTER — Encounter: Payer: Self-pay | Admitting: Endocrinology

## 2019-02-13 ENCOUNTER — Ambulatory Visit (INDEPENDENT_AMBULATORY_CARE_PROVIDER_SITE_OTHER): Payer: Medicare Other | Admitting: Endocrinology

## 2019-02-13 ENCOUNTER — Other Ambulatory Visit: Payer: Self-pay

## 2019-02-13 DIAGNOSIS — E1129 Type 2 diabetes mellitus with other diabetic kidney complication: Secondary | ICD-10-CM | POA: Diagnosis not present

## 2019-02-13 DIAGNOSIS — E119 Type 2 diabetes mellitus without complications: Secondary | ICD-10-CM

## 2019-02-13 MED ORDER — METFORMIN HCL ER 500 MG PO TB24: 500.0000 mg | ORAL_TABLET | Freq: Every day | ORAL | 3 refills | Status: DC

## 2019-02-13 NOTE — Patient Instructions (Addendum)
Please reduce the metformin to 1 pill per day.   Please continue the same pioglitazone.   Please see a new primary care provider, at the old office.   Please come back for a follow-up appointment in 6 months.

## 2019-02-13 NOTE — Progress Notes (Signed)
Subjective:    Patient ID: Marie Rhodes, female    DOB: 11/24/1935, 83 y.o.   MRN: [id number]  HPI  telehealth visit today via doxy video visit.  Alternatives to telehealth are presented to this patient, and the patient agrees to the telehealth visit. Pt is advised of the cost of the visit, and agrees to this, also.   Patient is at home, and I am at the office.   Persons attending the telehealth visit: the patient and I Pt returns for f/u of diabetes mellitus:  DM type: 2 Dx'ed: 9150 Complications: renal insufficiency.   Therapy: 2 oral meds. GDM: never DKA: never Severe hypoglycemia: never Pancreatitis: never Other: she has never taken insulin.   Interval history: pt states she feels well in general.  She takes meds as rx'ed.  She says cbg's are well-controlled.   Past Medical History:  Diagnosis Date  . ANXIETY 04/27/2007  . CROHN'S DISEASE, SMALL INTESTINE 06/08/2008   ileum, cecum  . DEPRESSION 04/27/2007  . DIABETES MELLITUS, TYPE II 04/27/2007  . Fatty liver   . Fatty tumor    left shoulder  . GERD (gastroesophageal reflux disease)   . GOITER, MULTINODULAR 05/04/2008  . Hyperlipemia   . HYPERTENSION 04/27/2007  . OBESITY 08/01/2010  . UNSPECIFIED ANEMIA 05/04/2008   Mild Anemia  . Vitamin D deficiency 04/08/2018    Past Surgical History:  Procedure Laterality Date  . APPENDECTOMY    . BIOPSY THYROID    . COLONOSCOPY  08/10/2006   normal  . LEFT OOPHORECTOMY  1992  . MECKEL DIVERTICULUM EXCISION     Ileal and cecal resection     Social History   Socioeconomic History  . Marital status: Widowed    Spouse name: Not on file  . Number of children: Not on file  . Years of education: Not on file  . Highest education level: Not on file  Occupational History  . 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 Financial resource strain: Not on file  . Food insecurity:    Worry: Not on file    Inability: Not on file  . Transportation  needs:    Medical: Not on file    Non-medical: Not on file  Tobacco Use  . Smoking status: Former Smoker    Last attempt to quit: 05/29/1988    Years since quitting: 30.7  . Smokeless tobacco: Never Used  Substance and Sexual Activity  . Alcohol use: No  . Drug use: No  . Sexual activity: Not on file  Lifestyle  . Physical activity:    Days per week: Not on file    Minutes per session: Not on file  . Stress: Not on file  Relationships  . Social connections:    Talks on phone: Not on file    Gets together: Not on file    Attends religious service: Not on file    Active member of club or organization: Not on file    Attends meetings of clubs or organizations: Not on file    Relationship status: Not on file  . Intimate partner violence:    Fear of current or ex partner: Not on file    Emotionally abused: Not on file    Physically abused: Not on file    Forced sexual activity: Not on file  Other Topics Concern  . Not on file  Social History Narrative   Pt does get regular exercise - walking  at gym   Former Smoker (college only)    Current Outpatient Medications on File Prior to Visit  Medication Sig Dispense Refill  . ALPRAZolam (XANAX) 0.25 MG tablet TAKE AS NEEDED FOR ANXIETY. 30 tablet 0  . Cholecalciferol (VITAMIN D) 2000 units tablet Take 1 tablet (2,000 Units total) by mouth daily.    . ferrous sulfate 325 (65 FE) MG tablet Take 1 tablet (325 mg total) by mouth 2 (two) times daily.  3  . hydrochlorothiazide (HYDRODIURIL) 12.5 MG tablet TAKE 1/2 TABLET DAILY. 45 tablet 0  . losartan (COZAAR) 50 MG tablet TAKE 1/2 TABLET DAILY. 45 tablet 0  . losartan-hydrochlorothiazide (HYZAAR) 50-12.5 MG tablet TAKE 1/2 TABLET DAILY. 45 tablet 0  . mesalamine (PENTASA) 500 MG CR capsule TAKE (1) CAPSULE TWICE DAILY. 180 capsule 3  . Multiple Vitamin (MULTIVITAMIN) tablet Take 1 tablet by mouth at bedtime.     . ONE TOUCH ULTRA TEST test strip CHECK BLOOD SUGAR ONCE A DAY. 50 each 2  .  pioglitazone (ACTOS) 15 MG tablet TAKE 1 TABLET EACH DAY. (Patient taking differently: Take 15 mg by mouth every morning. ) 30 tablet 11  . vitamin B-12 (CYANOCOBALAMIN) 1000 MCG tablet Take 1,000 mcg by mouth daily.     No current facility-administered medications on file prior to visit.     No Known Allergies  Family History  Problem Relation Age of Onset  . Breast cancer Mother 45  . Kidney disease Mother   . Diabetes Mother   . Kidney cancer Mother   . Skin cancer Father        mets  . Diabetes Father   . Melanoma Sister   . Diabetes Brother   . Tongue cancer Brother 58  . Stroke Maternal Grandmother   . Heart disease Paternal Grandmother   . Colon cancer Paternal Uncle      Review of Systems She denies hypoglycemia and ankle swelling.      Objective:   Physical Exam    Lab Results  Component Value Date   CREATININE 1.36 (H) 08/15/2018   BUN 26 (H) 08/15/2018   NA 139 08/15/2018   K 3.8 08/15/2018   CL 103 08/15/2018   CO2 27 08/15/2018      Assessment & Plan:  Type 2 DM: well-controlled. Renal insuff: she should reduce metformin.  Patient Instructions  Please reduce the metformin to 1 pill per day.   Please continue the same pioglitazone.   Please see a new primary care provider, at the old office.   Please come back for a follow-up appointment in 6 months.

## 2019-02-19 ENCOUNTER — Other Ambulatory Visit: Payer: Self-pay | Admitting: Endocrinology

## 2019-04-03 ENCOUNTER — Other Ambulatory Visit: Payer: Self-pay | Admitting: Endocrinology

## 2019-04-04 ENCOUNTER — Other Ambulatory Visit: Payer: Self-pay | Admitting: Endocrinology

## 2019-04-04 NOTE — Telephone Encounter (Signed)
Please refill x 3 months Further refills would have to be considered by new PCP   

## 2019-04-05 ENCOUNTER — Other Ambulatory Visit: Payer: Self-pay

## 2019-05-02 ENCOUNTER — Other Ambulatory Visit: Payer: Self-pay | Admitting: Internal Medicine

## 2019-05-02 ENCOUNTER — Other Ambulatory Visit: Payer: Self-pay | Admitting: Endocrinology

## 2019-05-02 NOTE — Telephone Encounter (Signed)
I called and left her a detailed message to call for her yearly appointment. Sent in refill with a note as well.

## 2019-05-29 ENCOUNTER — Ambulatory Visit (INDEPENDENT_AMBULATORY_CARE_PROVIDER_SITE_OTHER): Payer: Medicare Other | Admitting: Internal Medicine

## 2019-05-29 ENCOUNTER — Encounter: Payer: Self-pay | Admitting: Internal Medicine

## 2019-05-29 DIAGNOSIS — E538 Deficiency of other specified B group vitamins: Secondary | ICD-10-CM

## 2019-05-29 DIAGNOSIS — K5000 Crohn's disease of small intestine without complications: Secondary | ICD-10-CM

## 2019-05-29 DIAGNOSIS — E611 Iron deficiency: Secondary | ICD-10-CM

## 2019-05-29 MED ORDER — MESALAMINE ER 500 MG PO CPCR: ORAL_CAPSULE | ORAL | 11 refills | Status: DC

## 2019-05-29 NOTE — Patient Instructions (Addendum)
We will contact Inda Coke PA-C for primary care and you should hear from her office at [place] about an appointment.  I refilled the Pentasa today.  Good luck helping your granddaughter with the job search.  I appreciate the opportunity to care for you. Gatha Mayer, MD, [facility]

## 2019-05-29 NOTE — Assessment & Plan Note (Signed)
Stay on B12

## 2019-05-29 NOTE — Assessment & Plan Note (Signed)
Stay on Fe SO4

## 2019-05-29 NOTE — Assessment & Plan Note (Addendum)
Doing well.  She will stay on her very low-dose Pentasa as she seems to do well on that.  Return in 1 year sooner as needed.

## 2019-05-29 NOTE — Progress Notes (Signed)
Marie Rhodes 82 y.o. [birth year] [id number]  Assessment & Plan:   CROHN'S DISEASE, SMALL INTESTINE Doing well.  She will stay on her very low-dose Pentasa as she seems to do well on that.  Return in 1 year sooner as needed.   Iron deficiency Stay on Fe SO4  B12 deficiency Stay on B12  I appreciate the opportunity to care for this patient.    Subjective:   Chief Complaint: Follow-up of Crohn's disease  HPI Marie Rhodes is here for her annual follow-up.  She has a history of Crohn's disease of the small bowel and takes a very low dose of Pentasa and does not seem to have problems.  Last colonoscopy 2017 normal postoperative status post resection of distal ileum and proximal colon.  She does take an iron supplement to maintain good levels and to avoid anemia.  She denies any diarrhea or abdominal pain.  Marie Rhodes will continue to be her diabetic doctor but she needs to obtain a new primary care provider and is asking for my advice.  She also has a history of B12 deficiency and takes B12 supplementation.  She is taking the proper precautions regarding COVID-19.  She relates that her grandchildren have mated through college, her grandson has a job and is doing well.  Her granddaughter got a degree in social work from [employer] and Marie Rhodes is concerned because she has been underemployed in the granddaughter has anxiety about job interviews etc.  She is hopeful she can find a job. No Known Allergies Current Meds  Medication Sig  . ALPRAZolam (XANAX) 0.25 MG tablet TAKE AS NEEDED FOR ANXIETY.  . Cholecalciferol (VITAMIN D) 2000 units tablet Take 1 tablet (2,000 Units total) by mouth daily.  . ferrous sulfate 325 (65 FE) MG tablet Take 1 tablet (325 mg total) by mouth 2 (two) times daily.  Marland Kitchen losartan (COZAAR) 50 MG tablet TAKE 1/2 TABLET DAILY.  Marland Kitchen losartan-hydrochlorothiazide (HYZAAR) 50-12.5 MG tablet TAKE 1/2 TABLET DAILY.  . mesalamine (PENTASA) 500 MG CR capsule TAKE (1) CAPSULE TWICE  DAILY.  . metFORMIN (GLUCOPHAGE-XR) 500 MG 24 hr tablet Take 1 tablet (500 mg total) by mouth daily.  . Multiple Vitamin (MULTIVITAMIN) tablet Take 1 tablet by mouth at bedtime.   . ONE TOUCH ULTRA TEST test strip CHECK BLOOD SUGAR ONCE A DAY.  . pioglitazone (ACTOS) 15 MG tablet TAKE 1 TABLET EACH DAY.  . vitamin B-12 (CYANOCOBALAMIN) 1000 MCG tablet Take 1,000 mcg by mouth 2 (two) times daily.   . [DISCONTINUED] mesalamine (PENTASA) 500 MG CR capsule TAKE (1) CAPSULE TWICE DAILY.   Past Medical History:  Diagnosis Date  . ANXIETY 04/27/2007  . CROHN'S DISEASE, SMALL INTESTINE 06/08/2008   ileum, cecum  . DEPRESSION 04/27/2007  . DIABETES MELLITUS, TYPE II 04/27/2007  . Fatty liver   . Fatty tumor    left shoulder  . GERD (gastroesophageal reflux disease)   . GOITER, MULTINODULAR 05/04/2008  . Hyperlipemia   . HYPERTENSION 04/27/2007  . OBESITY 08/01/2010  . UNSPECIFIED ANEMIA 05/04/2008   Mild Anemia  . Vitamin D deficiency 04/08/2018   Past Surgical History:  Procedure Laterality Date  . APPENDECTOMY    . BIOPSY THYROID    . COLONOSCOPY  08/10/2006   normal  . LEFT OOPHORECTOMY  1992  . MECKEL DIVERTICULUM EXCISION     Ileal and cecal resection    Social History   Social History Narrative   Patient is widowed, she is a retired Pharmacist, hospital  Former Research scientist (life sciences) (college only), no alcohol or drug use   Both of her grandchildren lived with her while they went to college    family history includes Breast cancer (age of onset: 40) in her mother; Colon cancer in her paternal uncle; Diabetes in her brother, father, and mother; Heart disease in her paternal grandmother; Kidney cancer in her mother; Kidney disease in her mother; Melanoma in her sister; Skin cancer in her father; Stroke in her maternal grandmother; Tongue cancer (age of onset: 33) in her brother.   Review of Systems As per HPI  Objective:   Physical Exam @BP  126/68   Pulse 72   Temp 98.4 F (36.9 C) (Temporal)   Ht 5' 6"   (1.676 m)   Wt 256 lb 9.6 oz (116.4 kg)   BMI 41.42 kg/m @  General:  NAD Eyes:   anicteric Lungs:  clear Heart::  S1S2 no rubs, murmurs or gallops Abdomen:  soft and nontender, BS+ Ext:   no edema, cyanosis or clubbing    Data Reviewed:  See HPI labs are reviewed in the EMR but not documented in the chart

## 2019-06-06 ENCOUNTER — Other Ambulatory Visit: Payer: Self-pay | Admitting: Endocrinology

## 2019-06-16 ENCOUNTER — Encounter: Payer: Self-pay | Admitting: Physician Assistant

## 2019-06-16 ENCOUNTER — Other Ambulatory Visit: Payer: Self-pay

## 2019-06-16 ENCOUNTER — Ambulatory Visit (INDEPENDENT_AMBULATORY_CARE_PROVIDER_SITE_OTHER): Payer: Medicare Other | Admitting: Physician Assistant

## 2019-06-16 VITALS — BP 130/76 | HR 101 | Temp 98.2°F | Ht 66.0 in | Wt 257.0 lb

## 2019-06-16 DIAGNOSIS — Z23 Encounter for immunization: Secondary | ICD-10-CM | POA: Diagnosis not present

## 2019-06-16 DIAGNOSIS — E042 Nontoxic multinodular goiter: Secondary | ICD-10-CM | POA: Diagnosis not present

## 2019-06-16 DIAGNOSIS — N289 Disorder of kidney and ureter, unspecified: Secondary | ICD-10-CM

## 2019-06-16 DIAGNOSIS — E538 Deficiency of other specified B group vitamins: Secondary | ICD-10-CM | POA: Diagnosis not present

## 2019-06-16 DIAGNOSIS — E119 Type 2 diabetes mellitus without complications: Secondary | ICD-10-CM

## 2019-06-16 DIAGNOSIS — I10 Essential (primary) hypertension: Secondary | ICD-10-CM

## 2019-06-16 DIAGNOSIS — E669 Obesity, unspecified: Secondary | ICD-10-CM

## 2019-06-16 LAB — COMPREHENSIVE METABOLIC PANEL
ALT: 35 U/L (ref 0–35)
AST: 27 U/L (ref 0–37)
Albumin: 4.3 g/dL (ref 3.5–5.2)
Alkaline Phosphatase: 96 U/L (ref 39–117)
BUN: 21 mg/dL (ref 6–23)
CO2: 27 mEq/L (ref 19–32)
Calcium: 10.7 mg/dL — ABNORMAL HIGH (ref 8.4–10.5)
Chloride: 102 mEq/L (ref 96–112)
Creatinine, Ser: 1.18 mg/dL (ref 0.40–1.20)
GFR: 43.96 mL/min — ABNORMAL LOW (ref >60.00)
Glucose, Bld: 159 mg/dL — ABNORMAL HIGH (ref 70–99)
Potassium: 4.5 mEq/L (ref 3.5–5.1)
Sodium: 140 mEq/L (ref 135–145)
Total Bilirubin: 0.4 mg/dL (ref 0.2–1.2)
Total Protein: 6.8 g/dL (ref 6.0–8.3)

## 2019-06-16 LAB — CBC WITH DIFFERENTIAL/PLATELET
Basophils Absolute: 0 10*3/uL (ref 0.0–0.1)
Basophils Relative: 0.3 % (ref 0.0–3.0)
Eosinophils Absolute: 0.3 10*3/uL (ref 0.0–0.7)
Eosinophils Relative: 3.2 % (ref 0.0–5.0)
HCT: 39.5 % (ref 36.0–46.0)
Hemoglobin: 13.2 g/dL (ref 12.0–15.0)
Lymphocytes Relative: 29.9 % (ref 12.0–46.0)
Lymphs Abs: 2.6 10*3/uL (ref 0.7–4.0)
MCHC: 33.4 g/dL (ref 30.0–36.0)
MCV: 91.4 fl (ref 78.0–100.0)
Monocytes Absolute: 0.5 10*3/uL (ref 0.1–1.0)
Monocytes Relative: 6.4 % (ref 3.0–12.0)
Neutro Abs: 5.1 10*3/uL (ref 1.4–7.7)
Neutrophils Relative %: 60.2 % (ref 43.0–77.0)
Platelets: 239 10*3/uL (ref 150.0–400.0)
RBC: 4.32 Mil/uL (ref 3.87–5.11)
RDW: 13.1 % (ref 11.5–15.5)
WBC: 8.5 10*3/uL (ref 4.0–10.5)

## 2019-06-16 LAB — T4, FREE: Free T4: 0.82 ng/dL (ref 0.60–1.60)

## 2019-06-16 LAB — VITAMIN B12: Vitamin B-12: >1500 pg/mL — ABNORMAL HIGH (ref 211–911)

## 2019-06-16 LAB — TSH: TSH: 1.53 u[IU]/mL (ref 0.35–4.50)

## 2019-06-16 NOTE — Patient Instructions (Signed)
It was great to see you!  1. Please return for a nutrition visit with me at your convenience. We can do this virtually so we don't have to wear masks :)  2. I will be in touch with lab results so we can discuss plans for your blood pressure, thyroid levels and also update your B12 and make a plan for that  Take care,  Inda Coke PA-C

## 2019-06-16 NOTE — Progress Notes (Signed)
Marie Rhodes is a 83 y.o. female here to Establish Care.  History of Present Illness:   Chief Complaint  Patient presents with  . Establish Care  . Hypertension   Chronic renal insufficiency -- patient denies any prior knowledge of this.  Per chart review her GFR has been between 39 and 45 the past 2 years.  Last BMP was checked over 6 months ago.  Mother had kidney cancer.  Thyroid nodules -- this is currently managed by Dr. Renato Shin.  She had imaging in 2017 and was told that everything was normal.  She has not had her thyroid checked in about a year and a half, and she reports that she has a strong family history of thyroid issues so she would like this checked today if possible.  Diabetes -- currently managed by Dr. Renato Shin.  Last A1c was 10 months ago and was 5.9%.  She is currently on 500 mg metformin XR daily and Actos 15 mg tablet daily.   Obesity --she is very interested in losing weight.  She has tried keto diet in the past with some results, but not sustainable.  She has very limited activity.  HTN -- Currently taking Losartan-HCTZ 25-6.25 mg.  Patient denies chest pain, SOB, blurred vision, dizziness, unusual headaches, lower leg swelling. Patient is compliant with medication. Denies excessive caffeine intake, stimulant usage, excessive alcohol intake, or increase in salt consumption.  Vitamin B12 deficiency --patient reports history of this, and used to get injections.  She does take a daily supplement, however she felt much better when she was on B12 injections.  She would like to resume these if possible.  Health Maintenance: Immunizations -- UTD, will give High dose Flu today Colonoscopy -- UTD, done 2017 Normal Mammogram -- Last done 2017 per pt Bone Density -- done 10/14/2017 Normal Weight -- Weight: 257 lb (116.6 kg)  Body mass index is 41.48 kg/m.   Depression screen [facility] 2/9 06/16/2019  Decreased Interest 0  Down, Depressed, Hopeless 0  PHQ - 2 Score 0   Altered sleeping 0  Tired, decreased energy 0  Change in appetite 0  Feeling bad or failure about yourself  0  Trouble concentrating 0  Moving slowly or fidgety/restless 0  Suicidal thoughts 0  PHQ-9 Score 0  Difficult doing work/chores Not difficult at all    GAD 7 : Generalized Anxiety Score 06/16/2019  Nervous, Anxious, on Edge 0  Control/stop worrying 0  Worry too much - different things 0  Trouble relaxing 0  Restless 0  Easily annoyed or irritable 0  Afraid - awful might happen 0  Total GAD 7 Score 0  Anxiety Difficulty Not difficult at all     Other providers/specialists: Patient Care Team: Inda Coke, [place] as PCP - General (Physician Assistant) Gatha Mayer, MD as Attending Physician (Gastroenterology) Gerarda Fraction, MD as Referring Physician (Ophthalmology) Lorelle Gibbs, MD (Radiology) Newton Pigg, MD as Attending Physician (Obstetrics and Gynecology) Rolm Bookbinder, MD as Attending Physician (Dermatology)   Past Medical History:  Diagnosis Date  . ANXIETY 04/27/2007  . CROHN'S DISEASE, SMALL INTESTINE 06/08/2008   ileum, cecum  . DEPRESSION 04/27/2007  . Fatty tumor    left shoulder  . GOITER, MULTINODULAR 05/04/2008  . Hyperlipemia   . Molar pregnancy 1967  . OBESITY 08/01/2010  . UNSPECIFIED ANEMIA 05/04/2008   Mild Anemia  . Vitamin D deficiency 04/08/2018     Social History   Socioeconomic History  . Marital status:  Widowed    Spouse name: Not on file  . Number of children: Not on file  . Years of education: Not on file  . Highest education level: Not on file  Occupational History  . 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 Financial resource strain: Not on file  . Food insecurity    Worry: Not on file    Inability: Not on file  . Transportation needs    Medical: Not on file    Non-medical: Not on file  Tobacco Use  . Smoking status: Former Smoker    Quit date: 05/29/1988    Years since  quitting: 31.0  . Smokeless tobacco: Never Used  Substance and Sexual Activity  . Alcohol use: No  . Drug use: No  . Sexual activity: Not on file  Lifestyle  . Physical activity    Days per week: Not on file    Minutes per session: Not on file  . Stress: Not on file  Relationships  . Social Herbalist on phone: Not on file    Gets together: Not on file    Attends religious service: Not on file    Active member of club or organization: Not on file    Attends meetings of clubs or organizations: Not on file    Relationship status: Not on file  . Intimate partner violence    Fear of current or ex partner: Not on file    Emotionally abused: Not on file    Physically abused: Not on file    Forced sexual activity: Not on file  Other Topics Concern  . Not on file  Social History Narrative   Patient is widowed --husband died from brain aneurysm, she is a retired Pharmacist, hospital (second grade) and currently works at an [employer] 2 days a week.   Former Smoker (college only), no alcohol or drug use   Both of her grandchildren lived with her while they went to college    As of November 2019, she lives alone    Past Surgical History:  Procedure Laterality Date  . APPENDECTOMY    . BIOPSY THYROID    . COLONOSCOPY  08/10/2006   normal  . LEFT OOPHORECTOMY  1992  . MECKEL DIVERTICULUM EXCISION     Ileal and cecal resection     Family History  Problem Relation Age of Onset  . Breast cancer Mother 71  . Kidney disease Mother   . Diabetes Mother   . Kidney cancer Mother   . Skin cancer Father        mets  . Diabetes Father   . Melanoma Sister   . Diabetes Brother   . Tongue cancer Brother 46  . Stroke Maternal Grandmother   . Heart disease Paternal Grandmother   . Colon cancer Paternal Uncle     No Known Allergies   Current Medications:   Current Outpatient Medications:  .  ALPRAZolam (XANAX) 0.25 MG tablet, TAKE AS NEEDED FOR ANXIETY., Disp: 30 tablet,  Rfl: 0 .  Cholecalciferol (VITAMIN D) 2000 units tablet, Take 1 tablet (2,000 Units total) by mouth daily., Disp: , Rfl:  .  ferrous sulfate 325 (65 FE) MG tablet, Take 1 tablet (325 mg total) by mouth 2 (two) times daily., Disp: , Rfl: 3 .  losartan-hydrochlorothiazide (HYZAAR) 50-12.5 MG tablet, TAKE 1/2 TABLET DAILY., Disp: 45 tablet, Rfl: 0 .  mesalamine (PENTASA) 500 MG CR capsule, TAKE (  1) CAPSULE TWICE DAILY., Disp: 60 capsule, Rfl: 11 .  metFORMIN (GLUCOPHAGE-XR) 500 MG 24 hr tablet, Take 1 tablet (500 mg total) by mouth daily., Disp: 90 tablet, Rfl: 3 .  Multiple Vitamin (MULTIVITAMIN) tablet, Take 1 tablet by mouth at bedtime. , Disp: , Rfl:  .  ONE TOUCH ULTRA TEST test strip, CHECK BLOOD SUGAR ONCE A DAY., Disp: 50 each, Rfl: 2 .  pioglitazone (ACTOS) 15 MG tablet, TAKE 1 TABLET EACH DAY., Disp: 30 tablet, Rfl: 0 .  vitamin B-12 (CYANOCOBALAMIN) 1000 MCG tablet, Take 1,000 mcg by mouth 2 (two) times daily. , Disp: , Rfl:    Review of Systems:   ROS Negative unless otherwise specified per HPI.   Vitals:   Vitals:   06/16/19 1336  BP: 130/76  Pulse: (!) 101  Temp: 98.2 F (36.8 C)  TempSrc: Temporal  SpO2: 96%  Weight: 257 lb (116.6 kg)  Height: 5' 6"  (1.676 m)      Body mass index is 41.48 kg/m.  Physical Exam:   Physical Exam Vitals signs and nursing note reviewed.  Constitutional:      General: She is not in acute distress.    Appearance: She is well-developed. She is not ill-appearing or toxic-appearing.  Cardiovascular:     Rate and Rhythm: Normal rate and regular rhythm.     Pulses: Normal pulses.     Heart sounds: Normal heart sounds, S1 normal and S2 normal.     Comments: No LE edema Pulmonary:     Effort: Pulmonary effort is normal.     Breath sounds: Normal breath sounds.  Skin:    General: Skin is warm and dry.  Neurological:     Mental Status: She is alert.     GCS: GCS eye subscore is 4. GCS verbal subscore is 5. GCS motor subscore is 6.   Psychiatric:        Speech: Speech normal.        Behavior: Behavior normal. Behavior is cooperative.      Assessment and Plan:   Khalila was seen today for establish care and hypertension.  Diagnoses and all orders for this visit:  Renal insufficiency Will recheck labs, and make any further recommendations after recheck.  Type 2 diabetes mellitus without complication, without long-term current use of insulin ([place]) Will defer to Dr. Loanne Drilling.  -     Comprehensive metabolic panel -     CBC with Differential/Platelet  B12 deficiency Will recheck per patient request, will allow B12 injections if labs permit. -     Vitamin B12  GOITER, MULTINODULAR We will recheck her thyroid labs per her request.  This is managed by Dr. Loanne Drilling as well. -     TSH -     T4, free  Obesity  I recommended that we meet to discuss for a nutrition visit.  She is agreeable to this.  Hypertension  Well controlled.  We will continue the losartan-hydrochlorothiazide at this time, however we will keep an eye on her GFR, and may need to change to something else.  need for immunization against influenza -     Flu Vaccine QUAD High Dose(Fluad)  . Reviewed expectations re: course of current medical issues. . Discussed self-management of symptoms. . Outlined signs and symptoms indicating need for more acute intervention. . Patient verbalized understanding and all questions were answered. . See orders for this visit as documented in the electronic medical record. . Patient received an After-Visit Summary.  CMA or  LPN served as Education administrator during this visit. History, Physical, and Plan performed by medical provider. The above documentation has been reviewed and is accurate and complete.   Inda Coke, PA-C

## 2019-07-05 ENCOUNTER — Encounter: Payer: Self-pay | Admitting: Physician Assistant

## 2019-07-14 ENCOUNTER — Other Ambulatory Visit: Payer: Self-pay | Admitting: Internal Medicine

## 2019-07-14 ENCOUNTER — Other Ambulatory Visit: Payer: Self-pay | Admitting: Physician Assistant

## 2019-08-15 ENCOUNTER — Other Ambulatory Visit: Payer: Self-pay | Admitting: Endocrinology

## 2019-08-17 ENCOUNTER — Other Ambulatory Visit: Payer: Self-pay

## 2019-08-21 ENCOUNTER — Ambulatory Visit (INDEPENDENT_AMBULATORY_CARE_PROVIDER_SITE_OTHER): Payer: Medicare Other | Admitting: Endocrinology

## 2019-08-21 ENCOUNTER — Other Ambulatory Visit: Payer: Self-pay

## 2019-08-21 ENCOUNTER — Encounter: Payer: Self-pay | Admitting: Endocrinology

## 2019-08-21 VITALS — BP 112/60 | HR 129 | Ht 66.0 in | Wt 256.0 lb

## 2019-08-21 DIAGNOSIS — E119 Type 2 diabetes mellitus without complications: Secondary | ICD-10-CM

## 2019-08-21 DIAGNOSIS — E1129 Type 2 diabetes mellitus with other diabetic kidney complication: Secondary | ICD-10-CM

## 2019-08-21 LAB — POCT GLYCOSYLATED HEMOGLOBIN (HGB A1C): Hemoglobin A1C: 8.4 % — AB (ref 4.0–5.6)

## 2019-08-21 NOTE — Progress Notes (Signed)
Subjective:    Patient ID: Marie Rhodes, female    DOB: 08/03/1936, 83 y.o.   MRN: [id number]  HPI Pt returns for f/u of diabetes mellitus:  DM type: 2 Dx'ed: 3419 Complications: renal insufficiency.   Therapy: 2 oral meds. GDM: never DKA: never Severe hypoglycemia: never Pancreatitis: never Other: she has never taken insulin.   Interval history: pt states she feels well in general.  She takes meds as rx'ed.  She says cbg's are in the 100's.   Past Medical History:  Diagnosis Date  . ANXIETY 04/27/2007  . CROHN'S DISEASE, SMALL INTESTINE 06/08/2008   ileum, cecum  . DEPRESSION 04/27/2007  . Fatty tumor    left shoulder  . GOITER, MULTINODULAR 05/04/2008  . Hyperlipemia   . Molar pregnancy 1967  . OBESITY 08/01/2010  . UNSPECIFIED ANEMIA 05/04/2008   Mild Anemia  . Vitamin D deficiency 04/08/2018    Past Surgical History:  Procedure Laterality Date  . APPENDECTOMY    . BIOPSY THYROID    . COLONOSCOPY  08/10/2006   normal  . LEFT OOPHORECTOMY  1992  . MECKEL DIVERTICULUM EXCISION     Ileal and cecal resection     Social History   Socioeconomic History  . Marital status: Widowed    Spouse name: Not on file  . Number of children: Not on file  . Years of education: Not on file  . Highest education level: Not on file  Occupational History  . 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 Financial resource strain: Not on file  . Food insecurity    Worry: Not on file    Inability: Not on file  . Transportation needs    Medical: Not on file    Non-medical: Not on file  Tobacco Use  . Smoking status: Former Smoker    Quit date: 05/29/1988    Years since quitting: 31.2  . Smokeless tobacco: Never Used  Substance and Sexual Activity  . Alcohol use: No  . Drug use: No  . Sexual activity: Not on file  Lifestyle  . Physical activity    Days per week: Not on file    Minutes per session: Not on file  . Stress: Not on file   Relationships  . Social Herbalist on phone: Not on file    Gets together: Not on file    Attends religious service: Not on file    Active member of club or organization: Not on file    Attends meetings of clubs or organizations: Not on file    Relationship status: Not on file  . Intimate partner violence    Fear of current or ex partner: Not on file    Emotionally abused: Not on file    Physically abused: Not on file    Forced sexual activity: Not on file  Other Topics Concern  . Not on file  Social History Narrative   Patient is widowed --husband died from brain aneurysm, she is a retired Pharmacist, hospital (second grade) and currently works at an [employer] 2 days a week.   Former Smoker (college only), no alcohol or drug use   Both of her grandchildren lived with her while they went to college    As of November 2019, she lives alone    Current Outpatient Medications on File Prior to Visit  Medication Sig Dispense Refill  . ALPRAZolam (XANAX) 0.25 MG tablet TAKE AS  NEEDED FOR ANXIETY. 30 tablet 0  . Cholecalciferol (VITAMIN D) 2000 units tablet Take 1 tablet (2,000 Units total) by mouth daily.    . ferrous sulfate 325 (65 FE) MG tablet Take 1 tablet (325 mg total) by mouth 2 (two) times daily.  3  . losartan-hydrochlorothiazide (HYZAAR) 50-12.5 MG tablet TAKE 1/2 TABLET DAILY. 45 tablet 0  . mesalamine (PENTASA) 500 MG CR capsule TAKE (1) CAPSULE TWICE DAILY. 60 capsule 11  . metFORMIN (GLUCOPHAGE-XR) 500 MG 24 hr tablet Take 1 tablet (500 mg total) by mouth daily. 90 tablet 3  . Multiple Vitamin (MULTIVITAMIN) tablet Take 1 tablet by mouth at bedtime.     . ONE TOUCH ULTRA TEST test strip CHECK BLOOD SUGAR ONCE A DAY. 50 each 2  . pioglitazone (ACTOS) 15 MG tablet TAKE 1 TABLET EACH DAY. 30 tablet 0  . vitamin B-12 (CYANOCOBALAMIN) 1000 MCG tablet Take 1,000 mcg by mouth 2 (two) times daily.      No current facility-administered medications on file prior to visit.      No Known Allergies  Family History  Problem Relation Age of Onset  . Breast cancer Mother 96  . Kidney disease Mother   . Diabetes Mother   . Kidney cancer Mother   . Skin cancer Father        mets  . Diabetes Father   . Melanoma Sister   . Diabetes Brother   . Tongue cancer Brother 37  . Stroke Maternal Grandmother   . Heart disease Paternal Grandmother   . Colon cancer Paternal Uncle     BP 112/60 (BP Location: Left Wrist, Patient Position: Sitting, Cuff Size: Large)   Pulse (!) 129   Ht 5' 6"  (1.676 m)   Wt 256 lb (116.1 kg)   SpO2 93%   BMI 41.32 kg/m    Review of Systems Denies sob and palpitations.      Objective:   Physical Exam VITAL SIGNS:  See vs page GENERAL: no distress Pulses: dorsalis pedis intact bilat.   MSK: no deformity of the feet CV: trace bilat leg edema Skin:  no ulcer on the feet.  normal color and temp on the feet.   Neuro: sensation is intact to touch on the feet.    Lab Results  Component Value Date   HGBA1C 8.4 (A) 08/21/2019   Lab Results  Component Value Date   CREATININE 1.18 06/16/2019   BUN 21 06/16/2019   NA 140 06/16/2019   K 4.5 06/16/2019   CL 102 06/16/2019   CO2 27 06/16/2019   Lab Results  Component Value Date   TSH 1.53 06/16/2019       Assessment & Plan:  Type 2 DM: worse.  She declines to add another medication.   Tachycardia: not thyroid-related Edema and renal insuff: These limits rx options.   Patient Instructions  check your blood sugar twice a day.  vary the time of day when you check, between before the 3 meals, and at bedtime.  also check if you have symptoms of your blood sugar being too high or too low.  please keep a record of the readings and bring it to your next appointment here (or you can bring the meter itself).  You can write it on any piece of paper.  please call us sooner if your blood sugar goes below 70, or if you have a lot of readings over 200. Please come back for a follow-up  appointment in 2 months.  If the A1c is still high then, we can add "[place]."

## 2019-08-21 NOTE — Patient Instructions (Addendum)
check your blood sugar twice a day.  vary the time of day when you check, between before the 3 meals, and at bedtime.  also check if you have symptoms of your blood sugar being too high or too low.  please keep a record of the readings and bring it to your next appointment here (or you can bring the meter itself).  You can write it on any piece of paper.  please call us sooner if your blood sugar goes below 70, or if you have a lot of readings over 200. Please come back for a follow-up appointment in 2 months.  If the A1c is still high then, we can add "[place]."

## 2019-09-12 ENCOUNTER — Other Ambulatory Visit: Payer: Self-pay | Admitting: Endocrinology

## 2019-09-18 ENCOUNTER — Encounter: Payer: Medicare Other | Admitting: Physician Assistant

## 2019-09-19 ENCOUNTER — Ambulatory Visit: Payer: Medicare Other | Attending: Internal Medicine

## 2019-09-19 DIAGNOSIS — R238 Other skin changes: Secondary | ICD-10-CM

## 2019-09-19 DIAGNOSIS — U071 COVID-19: Secondary | ICD-10-CM

## 2019-09-21 LAB — NOVEL CORONAVIRUS, NAA: SARS-CoV-2, NAA: DETECTED — AB

## 2019-10-10 ENCOUNTER — Other Ambulatory Visit: Payer: Self-pay | Admitting: Endocrinology

## 2019-10-18 NOTE — Progress Notes (Signed)
I acted as a Education administrator for [employer], PA-C Anselmo Pickler, LPN   Subjective:    Marie Rhodes is a 84 y.o. female and is here for a comprehensive physical exam.   HPI  There are no preventive care reminders to display for this patient.  Acute Concerns: None  Chronic Issues: HTN Currently taking 1/2 tab of Losartan-HCTZ 50-12.5 mg. Pt has not been checking home blood pressure readings.. Patient denies chest pain, SOB, blurred vision, dizziness, unusual headaches, lower leg swelling. Patient is compliant with medication. Denies excessive caffeine intake, stimulant usage, excessive alcohol intake, or increase in salt consumption.  BP Readings from Last 3 Encounters:  10/19/19 124/70  08/21/19 112/60  06/16/19 130/76   Diabetes Current DM meds: Metformin XR 500 mg daily and Actos 15 mg daily. She has not been checking them at home, sugars were elevated in Nov when she saw Dr. Loanne Drilling last. Patient is compliant with medications. Denies: hypoglycemic or hyperglycemic episodes or symptoms. This patient's diabetes is complicated by renal insufficiency. Is followed by Dr. Renato Shin for this.  Lab Results  Component Value Date   HGBA1C 8.4 (A) 08/21/2019   Crohns Disease Managed by Dr. Silvano Rusk. Started taking probiotic.   Renal insufficiency Most recent GFR 39-44. Due for recheck.   Health Maintenance: Immunizations -- UTD Colonoscopy -- 2017 Mammogram -- n/a PAP -- N/A Bone Density -- 2019 -- normal Diet -- has worked on diet since seeing Loanne Drilling in Nov Sleep habits -- overall gets good sleep Exercise -- working on getting her stamina back Weight -- Weight: 242 lb 8 oz (110 kg)  Mood -- no concerns Weight history: Wt Readings from Last 10 Encounters:  10/19/19 242 lb 8 oz (110 kg)  08/21/19 256 lb (116.1 kg)  06/16/19 257 lb (116.6 kg)  05/29/19 256 lb 9.6 oz (116.4 kg)  08/15/18 239 lb 9.6 oz (108.7 kg)  04/08/18 250 lb 6 oz (113.6 kg)  03/26/18 250  lb (113.4 kg)  02/17/18 249 lb 3.2 oz (113 kg)  10/14/17 253 lb (114.8 kg)  10/08/17 253 lb 3.2 oz (114.9 kg)   No LMP recorded. Patient is postmenopausal.   Depression screen [facility] 2/9 10/19/2019  Decreased Interest 0  Down, Depressed, Hopeless 0  PHQ - 2 Score 0  Altered sleeping 0  Tired, decreased energy 2  Change in appetite 0  Feeling bad or failure about yourself  0  Trouble concentrating 0  Moving slowly or fidgety/restless 0  Suicidal thoughts 0  PHQ-9 Score 2  Difficult doing work/chores Somewhat difficult     Other providers/specialists: Patient Care Team: Inda Coke, [place] as PCP - General (Physician Assistant) Gatha Mayer, MD as Attending Physician (Gastroenterology) Gerarda Fraction, MD as Referring Physician (Ophthalmology) Lorelle Gibbs, MD (Radiology) Newton Pigg, MD as Attending Physician (Obstetrics and Gynecology) Rolm Bookbinder, MD as Attending Physician (Dermatology)    PMHx, SurgHx, SocialHx, Medications, and Allergies were reviewed in the Visit Navigator and updated as appropriate.   Past Medical History:  Diagnosis Date  . ANXIETY 04/27/2007  . CROHN'S DISEASE, SMALL INTESTINE 06/08/2008   ileum, cecum  . DEPRESSION 04/27/2007  . Fatty tumor    left shoulder  . GOITER, MULTINODULAR 05/04/2008  . Hyperlipemia   . Molar pregnancy 1967  . OBESITY 08/01/2010  . UNSPECIFIED ANEMIA 05/04/2008   Mild Anemia  . Vitamin D deficiency 04/08/2018     Past Surgical History:  Procedure Laterality Date  . APPENDECTOMY    .  BIOPSY THYROID    . COLONOSCOPY  08/10/2006   normal  . LEFT OOPHORECTOMY  1992  . MECKEL DIVERTICULUM EXCISION     Ileal and cecal resection      Family History  Problem Relation Age of Onset  . Breast cancer Mother 28  . Kidney disease Mother   . Diabetes Mother   . Kidney cancer Mother        Renal Cell Carcinoma  . Skin cancer Father        mets  . Diabetes Father   . Melanoma Sister   . Diabetes Brother    . Tongue cancer Brother 27  . Stroke Maternal Grandmother   . Heart disease Paternal Grandmother   . Colon cancer Paternal Uncle     Social History   Tobacco Use  . Smoking status: Former Smoker    Quit date: 05/29/1988    Years since quitting: 31.4  . Smokeless tobacco: Never Used  Substance Use Topics  . Alcohol use: No  . Drug use: No    Review of Systems:   Review of Systems  Constitutional: Negative for chills, fever, malaise/fatigue and weight loss.  HENT: Negative for hearing loss, sinus pain and sore throat.   Respiratory: Negative for cough and hemoptysis.   Cardiovascular: Negative for chest pain, palpitations, leg swelling and PND.  Gastrointestinal: Negative for abdominal pain, constipation, diarrhea, heartburn, nausea and vomiting.  Genitourinary: Negative for dysuria, frequency and urgency.  Musculoskeletal: Negative for back pain, myalgias and neck pain.  Skin: Negative for itching and rash.  Neurological: Negative for dizziness, tingling, seizures and headaches.  Endo/Heme/Allergies: Negative for polydipsia.  Psychiatric/Behavioral: Negative for depression. The patient is not nervous/anxious.     Objective:   BP 124/70 (BP Location: Left Arm, Patient Position: Sitting, Cuff Size: Large)   Pulse (!) 108   Temp (!) 97.4 F (36.3 C) (Temporal)   Ht 5' 6"  (1.676 m)   Wt 242 lb 8 oz (110 kg)   SpO2 96%   BMI 39.14 kg/m  Body mass index is 39.14 kg/m.   General Appearance:    Alert, cooperative, no distress, appears stated age  Head:    Normocephalic, without obvious abnormality, atraumatic  Eyes:    PERRL, conjunctiva/corneas clear, EOM's intact, fundi    benign, both eyes  Ears:    Normal TM's and external ear canals, both ears  Nose:   Nares normal, septum midline, mucosa normal, no drainage    or sinus tenderness  Throat:   Lips, mucosa, and tongue normal; teeth and gums normal  Neck:   Supple, symmetrical, trachea midline, no adenopathy;     thyroid:  no enlargement/tenderness/nodules; no carotid   bruit or JVD  Back:     Symmetric, no curvature, ROM normal, no CVA tenderness  Lungs:     Clear to auscultation bilaterally, respirations unlabored  Chest Wall:    No tenderness or deformity   Heart:    Regular rate and rhythm, S1 and S2 normal, no murmur, rub or gallop  Breast Exam:    Deferred  Abdomen:     Soft, non-tender, bowel sounds active all four quadrants,    no masses, no organomegaly  Genitalia:    Deferred  Extremities:   Extremities normal, atraumatic, no cyanosis or edema  Pulses:   2+ and symmetric all extremities  Skin:   Skin color, texture, turgor normal, no rashes or lesions  Lymph nodes:   Cervical, supraclavicular, and axillary nodes normal  Neurologic:   CNII-XII intact, normal strength, sensation and reflexes    throughout     Assessment/Plan:   Percilla was seen today for annual exam.  Diagnoses and all orders for this visit:  Routine physical examination Today patient counseled on age appropriate routine health concerns for screening and prevention, each reviewed and up to date or declined. Immunizations reviewed and up to date or declined. Labs ordered and reviewed. Risk factors for depression reviewed and negative. Hearing function and visual acuity are intact. ADLs screened and addressed as needed. Functional ability and level of safety reviewed and appropriate. Education, counseling and referrals performed based on assessed risks today. Patient provided with a copy of personalized plan for preventive services.  Essential hypertension Well controlled currently. Continue 1/2 tab of Hyzaar 50-12.5 mg, follow-up in 6 months, sooner if concerns. -     CBC with Differential/Platelet -     Comprehensive metabolic panel  Renal insufficiency Update labs today. Avoid nephrotoxic agents.   Type 2 diabetes mellitus without complication, without long-term current use of insulin (HCC) Management per Dr.  Loanne Drilling; encouraged continued healthy diet and weight loss.  Obesity Commended weight loss efforts thus far, continue current efforts.  Encounter for lipid screening for cardiovascular disease -     Lipid panel     Well Adult Exam: Labs ordered: Yes. Patient counseling was done. See below for items discussed. Discussed the patient's BMI.  The BMI is not in the acceptable range; BMI management plan is completed Follow up in 6 months. Breast cancer screening: n/a. Cervical cancer screening: n/a   Patient Counseling: [x]    Nutrition: Stressed importance of moderation in sodium/caffeine intake, saturated fat and cholesterol, caloric balance, sufficient intake of fresh fruits, vegetables, fiber, calcium, iron, and 1 mg of folate supplement per day (for females capable of pregnancy).  [x]    Stressed the importance of regular exercise.   [x]    Substance Abuse: Discussed cessation/primary prevention of tobacco, alcohol, or other drug use; driving or other dangerous activities under the influence; availability of treatment for abuse.   [x]    Injury prevention: Discussed safety belts, safety helmets, smoke detector, smoking near bedding or upholstery.   [x]    Sexuality: Discussed sexually transmitted diseases, partner selection, use of condoms, avoidance of unintended pregnancy  and contraceptive alternatives.  [x]    Dental health: Discussed importance of regular tooth brushing, flossing, and dental visits.  [x]    Health maintenance and immunizations reviewed. Please refer to Health maintenance section.   CMA or LPN served as scribe during this visit. History, Physical, and Plan performed by medical provider. The above documentation has been reviewed and is accurate and complete.   Inda Coke, PA-C [place]

## 2019-10-19 ENCOUNTER — Other Ambulatory Visit: Payer: Self-pay

## 2019-10-19 ENCOUNTER — Ambulatory Visit (INDEPENDENT_AMBULATORY_CARE_PROVIDER_SITE_OTHER): Payer: Medicare PPO | Admitting: Physician Assistant

## 2019-10-19 ENCOUNTER — Ambulatory Visit (INDEPENDENT_AMBULATORY_CARE_PROVIDER_SITE_OTHER): Payer: Medicare PPO

## 2019-10-19 ENCOUNTER — Encounter: Payer: Self-pay | Admitting: Physician Assistant

## 2019-10-19 VITALS — BP 124/70 | Temp 97.4°F | Ht 66.0 in | Wt 242.5 lb

## 2019-10-19 VITALS — BP 124/70 | HR 108 | Temp 97.4°F | Ht 66.0 in | Wt 242.5 lb

## 2019-10-19 DIAGNOSIS — E119 Type 2 diabetes mellitus without complications: Secondary | ICD-10-CM

## 2019-10-19 DIAGNOSIS — I10 Essential (primary) hypertension: Secondary | ICD-10-CM

## 2019-10-19 DIAGNOSIS — Z13220 Encounter for screening for lipoid disorders: Secondary | ICD-10-CM

## 2019-10-19 DIAGNOSIS — Z0000 Encounter for general adult medical examination without abnormal findings: Secondary | ICD-10-CM | POA: Diagnosis not present

## 2019-10-19 DIAGNOSIS — E669 Obesity, unspecified: Secondary | ICD-10-CM

## 2019-10-19 DIAGNOSIS — Z136 Encounter for screening for cardiovascular disorders: Secondary | ICD-10-CM | POA: Diagnosis not present

## 2019-10-19 DIAGNOSIS — N289 Disorder of kidney and ureter, unspecified: Secondary | ICD-10-CM

## 2019-10-19 LAB — CBC WITH DIFFERENTIAL/PLATELET
Basophils Absolute: 0.1 10*3/uL (ref 0.0–0.1)
Basophils Relative: 0.7 % (ref 0.0–3.0)
Eosinophils Absolute: 0.3 10*3/uL (ref 0.0–0.7)
Eosinophils Relative: 4.1 % (ref 0.0–5.0)
HCT: 39 % (ref 36.0–46.0)
Hemoglobin: 12.9 g/dL (ref 12.0–15.0)
Lymphocytes Relative: 24.6 % (ref 12.0–46.0)
Lymphs Abs: 2.1 10*3/uL (ref 0.7–4.0)
MCHC: 33 g/dL (ref 30.0–36.0)
MCV: 90.8 fl (ref 78.0–100.0)
Monocytes Absolute: 0.5 10*3/uL (ref 0.1–1.0)
Monocytes Relative: 6 % (ref 3.0–12.0)
Neutro Abs: 5.4 10*3/uL (ref 1.4–7.7)
Neutrophils Relative %: 64.6 % (ref 43.0–77.0)
Platelets: 271 10*3/uL (ref 150.0–400.0)
RBC: 4.29 Mil/uL (ref 3.87–5.11)
RDW: 14.3 % (ref 11.5–15.5)
WBC: 8.4 10*3/uL (ref 4.0–10.5)

## 2019-10-19 LAB — COMPREHENSIVE METABOLIC PANEL
ALT: 32 U/L (ref 0–35)
AST: 28 U/L (ref 0–37)
Albumin: 4.2 g/dL (ref 3.5–5.2)
Alkaline Phosphatase: 88 U/L (ref 39–117)
BUN: 32 mg/dL — ABNORMAL HIGH (ref 6–23)
CO2: 28 mEq/L (ref 19–32)
Calcium: 10.7 mg/dL — ABNORMAL HIGH (ref 8.4–10.5)
Chloride: 98 mEq/L (ref 96–112)
Creatinine, Ser: 1.31 mg/dL — ABNORMAL HIGH (ref 0.40–1.20)
GFR: 38.93 mL/min — ABNORMAL LOW (ref >60.00)
Glucose, Bld: 276 mg/dL — ABNORMAL HIGH (ref 70–99)
Potassium: 4.4 mEq/L (ref 3.5–5.1)
Sodium: 134 mEq/L — ABNORMAL LOW (ref 135–145)
Total Bilirubin: 0.4 mg/dL (ref 0.2–1.2)
Total Protein: 7.3 g/dL (ref 6.0–8.3)

## 2019-10-19 LAB — LIPID PANEL
Cholesterol: 190 mg/dL (ref 0–200)
HDL: 51.4 mg/dL (ref >39.00)
NonHDL: 138.24
Total CHOL/HDL Ratio: 4
Triglycerides: 286 mg/dL — ABNORMAL HIGH (ref 0.0–149.0)
VLDL: 57.2 mg/dL — ABNORMAL HIGH (ref 0.0–40.0)

## 2019-10-19 LAB — LDL CHOLESTEROL, DIRECT: Direct LDL: 96 mg/dL

## 2019-10-19 NOTE — Progress Notes (Addendum)
Subjective:   Marie Rhodes is a 84 y.o. female who presents for Medicare Annual (Subsequent) preventive examination.  Review of Systems:   Cardiac Risk Factors include: advanced age (>37mn, >>7women);diabetes mellitus;dyslipidemia;hypertension;obesity (BMI >30kg/m2)    Objective:     Vitals: BP 124/70   Temp (!) 97.4 F (36.3 C) (Temporal)   Ht 5' 6"  (1.676 m)   Wt 242 lb 8.1 oz (110 kg)   SpO2 96%   BMI 39.14 kg/m   Body mass index is 39.14 kg/m.  Advanced Directives 10/19/2019 03/26/2018 10/08/2017 10/05/2016 01/28/2016 12/30/2015 12/06/2015  Does Patient Have a Medical Advance Directive? No No No No No No No  Would patient like information on creating a medical advance directive? Yes (MAU/Ambulatory/Procedural Areas - Information given) - - - - No - patient declined information -    Tobacco Social History   Tobacco Use  Smoking Status Former Smoker  . Quit date: 05/29/1988  . Years since quitting: 31.4  Smokeless Tobacco Never Used     Counseling given: Not Answered   Clinical Intake:  Pre-visit preparation completed: Yes  Pain : No/denies pain  Diabetes: Yes(followed by Dr. ELoanne Drilling CBG done?: No Did pt. bring in CBG monitor from home?: No  How often do you need to have someone help you when you read instructions, pamphlets, or other written materials from your doctor or pharmacy?: 1 - Never  Interpreter Needed?: No  Information entered by :: CDenman GeorgeLPN  Past Medical History:  Diagnosis Date  . ANXIETY 04/27/2007  . CROHN'S DISEASE, SMALL INTESTINE 06/08/2008   ileum, cecum  . DEPRESSION 04/27/2007  . Fatty tumor    left shoulder  . GOITER, MULTINODULAR 05/04/2008  . Hyperlipemia   . Molar pregnancy 1967  . OBESITY 08/01/2010  . UNSPECIFIED ANEMIA 05/04/2008   Mild Anemia  . Vitamin D deficiency 04/08/2018   Past Surgical History:  Procedure Laterality Date  . APPENDECTOMY    . BIOPSY THYROID    . COLONOSCOPY  08/10/2006   normal  . LEFT  OOPHORECTOMY  1992  . MECKEL DIVERTICULUM EXCISION     Ileal and cecal resection    Family History  Problem Relation Age of Onset  . Breast cancer Mother 756 . Kidney disease Mother   . Diabetes Mother   . Kidney cancer Mother        Renal Cell Carcinoma  . Skin cancer Father        mets  . Diabetes Father   . Melanoma Sister   . Diabetes Brother   . Tongue cancer Brother 643 . Stroke Maternal Grandmother   . Heart disease Paternal Grandmother   . Colon cancer Paternal Uncle    Social History   Socioeconomic History  . Marital status: Widowed    Spouse name: Not on file  . Number of children: Not on file  . Years of education: Not on file  . Highest education level: Not on file  Occupational History  . Occupation: retired tPharmacist, hospital   Comment: retired (Merchant navy officer  Tobacco Use  . Smoking status: Former Smoker    Quit date: 05/29/1988    Years since quitting: 31.4  . Smokeless tobacco: Never Used  Substance and Sexual Activity  . Alcohol use: No  . Drug use: No  . Sexual activity: Not on file  Other Topics Concern  . 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   Former Smoker (college only), no alcohol or drug use   Both of her grandchildren lived with her while they went to college    As of November 2019, she lives alone   Social Determinants of Radio broadcast assistant Strain:   . Difficulty of Paying Living Expenses: Not on file  Food Insecurity:   . Worried About Charity fundraiser in the Last Year: Not on file  . Ran Out of Food in the Last Year: Not on file  Transportation Needs:   . Lack of Transportation (Medical): Not on file  . Lack of Transportation (Non-Medical): Not on file  Physical Activity:   . Days of Exercise per Week: Not on file  . Minutes of Exercise per Session: Not on file  Stress:    . Feeling of Stress : Not on file  Social Connections:   . Frequency of Communication with Friends and Family: Not on file  . Frequency of Social Gatherings with Friends and Family: Not on file  . Attends Religious Services: Not on file  . Active Member of Clubs or Organizations: Not on file  . Attends Archivist Meetings: Not on file  . Marital Status: Not on file    Outpatient Encounter Medications as of 10/19/2019  Medication Sig  . ALPRAZolam (XANAX) 0.25 MG tablet TAKE AS NEEDED FOR ANXIETY.  . ferrous sulfate 325 (65 FE) MG tablet Take 1 tablet (325 mg total) by mouth 2 (two) times daily.  Marland Kitchen losartan-hydrochlorothiazide (HYZAAR) 50-12.5 MG tablet TAKE 1/2 TABLET DAILY.  . mesalamine (PENTASA) 500 MG CR capsule TAKE (1) CAPSULE TWICE DAILY.  . metFORMIN (GLUCOPHAGE-XR) 500 MG 24 hr tablet Take 1 tablet (500 mg total) by mouth daily.  . Multiple Vitamin (MULTIVITAMIN) tablet Take 1 tablet by mouth at bedtime.   . ONE TOUCH ULTRA TEST test strip CHECK BLOOD SUGAR ONCE A DAY.  . pioglitazone (ACTOS) 15 MG tablet TAKE 1 TABLET EACH DAY.  . vitamin B-12 (CYANOCOBALAMIN) 1000 MCG tablet Take 1,000 mcg by mouth 2 (two) times daily.   . Vitamin D, Cholecalciferol, 25 MCG (1000 UT) CAPS Take 1 capsule by mouth daily.   No facility-administered encounter medications on file as of 10/19/2019.    Activities of Daily Living In your present state of health, do you have any difficulty performing the following activities: 10/19/2019 06/16/2019  Hearing? N N  Vision? N N  Difficulty concentrating or making decisions? N N  Walking or climbing stairs? N N  Dressing or bathing? N N  Doing errands, shopping? N N  Preparing Food and eating ? N -  Using the Toilet? N -  In the past six months, have you accidently leaked urine? N -  Do you have problems with loss of bowel control? N -  Managing your Medications? N -  Managing your Finances? N -  Housekeeping or managing your Housekeeping?  N -  Some recent data might be hidden    Patient Care Team: Inda Coke, [place] as PCP - General (Physician Assistant) Gatha Mayer, MD as Attending Physician (Gastroenterology) Lorelle Gibbs, MD (Radiology) Rolm Bookbinder, MD as Attending Physician (Dermatology) Renato Shin, MD as Consulting Physician (Endocrinology) Drake Leach, [place] as Consulting Physician (Optometry)    Assessment:   This is a routine wellness examination for [place].  Exercise Activities and Dietary recommendations Current  Exercise Habits: The patient does not participate in regular exercise at present  Goals   None     Fall Risk Fall Risk  10/19/2019 12/06/2015  Falls in the past year? 0 No  Number falls in past yr: 0 -  Injury with Fall? 0 -  Follow up Falls evaluation completed;Education provided;Falls prevention discussed -   Is the patient's home free of loose throw rugs in walkways, pet beds, electrical cords, etc?   yes      Grab bars in the bathroom? yes      Handrails on the stairs?   yes      Adequate lighting?   yes  Timed Get Up and Go performed: completed and within normal timeframe; no gait abnormalities noted    Depression Screen PHQ 2/9 Scores 10/19/2019 06/16/2019 12/06/2015  PHQ - 2 Score 0 0 0  PHQ- 9 Score 2 0 -     Cognitive Function- see Mini-Cog results     Immunization History  Administered Date(s) Administered  . Fluad Quad(high Dose 65+) 06/16/2019  . Influenza Split 09/10/2011  . Influenza, High Dose Seasonal PF 08/15/2018  . Influenza,inj,Quad PF,6+ Mos 10/08/2017  . Pneumococcal Conjugate-13 01/22/2015  . Pneumococcal Polysaccharide-23 01/26/2006  . Td 01/26/2005  . Tdap 10/10/2017  . Zoster 01/22/2015    Qualifies for Shingles Vaccine?completed and within normal timeframe; no gait abnormalities noted   Screening Tests Health Maintenance  Topic Date Due  . OPHTHALMOLOGY EXAM  04/17/2020 (Originally 06/07/2019)  . HEMOGLOBIN A1C  02/18/2020  .  FOOT EXAM  10/18/2020  . TETANUS/TDAP  10/11/2027  . INFLUENZA VACCINE  Completed  . DEXA SCAN  Completed  . PNA vac Low Risk Adult  Completed    Cancer Screenings: Lung: Low Dose CT Chest recommended if Age 106-80 years, 30 pack-year currently smoking OR have quit w/in 15years. Patient does not qualify. Breast:  Up to date on Mammogram? Yes   Up to date of Bone Density/Dexa? Yes Colorectal: colonoscopy 12/30/15    Plan:  I have personally reviewed and addressed the Medicare Annual Wellness questionnaire and have noted the following in the patient's chart:  A. Medical and social history B. Use of alcohol, tobacco or illicit drugs  C. Current medications and supplements D. Functional ability and status E.  Nutritional status F.  Physical activity G. Advance directives H. List of other physicians I.  Hospitalizations, surgeries, and ER visits in previous 12 months J.  [place] such as hearing and vision if needed, cognitive and depression L. Referrals, records requested, and appointments- will request last diabetic eye exam notes from Dr. Andria Frames    In addition, I have reviewed and discussed with patient certain preventive protocols, quality metrics, and best practice recommendations. A written personalized care plan for preventive services as well as general preventive health recommendations were provided to patient.   Signed,  Denman George, LPN  Nurse Health Advisor   Nurse Notes: no additional   I have reviewed documentation for AWV and Advance Care planning provided by [facility], I agree with documentation, I was immediately available for any questions. Inda Coke, [place]

## 2019-10-19 NOTE — Patient Instructions (Signed)
It was great to see you!  Please go to the lab for blood work.   Our office will call you with your results unless you have chosen to receive results via MyChart.  If your blood work is normal we will follow-up each year for physicals and as scheduled for chronic medical problems.  If anything is abnormal we will treat accordingly and get you in for a follow-up.  Take care,  [facility] Maintenance, Female Adopting a healthy lifestyle and getting preventive care are important in promoting health and wellness. Ask your health care provider about:  The right schedule for you to have regular tests and exams.  Things you can do on your own to prevent diseases and keep yourself healthy. What should I know about diet, weight, and exercise? Eat a healthy diet   Eat a diet that includes plenty of vegetables, fruits, low-fat dairy products, and lean protein.  Do not eat a lot of foods that are high in solid fats, added sugars, or sodium. Maintain a healthy weight Body mass index (BMI) is used to identify weight problems. It estimates body fat based on height and weight. Your health care provider can help determine your BMI and help you achieve or maintain a healthy weight. Get regular exercise Get regular exercise. This is one of the most important things you can do for your health. Most adults should:  Exercise for at least 150 minutes each week. The exercise should increase your heart rate and make you sweat (moderate-intensity exercise).  Do strengthening exercises at least twice a week. This is in addition to the moderate-intensity exercise.  Spend less time sitting. Even light physical activity can be beneficial. Watch cholesterol and blood lipids Have your blood tested for lipids and cholesterol at 82 years of age, then have this test every 5 years. Have your cholesterol levels checked more often if:  Your lipid or cholesterol levels are high.  You are older than 82  years of age.  You are at high risk for heart disease. What should I know about cancer screening? Depending on your health history and family history, you may need to have cancer screening at various ages. This may include screening for:  Breast cancer.  Cervical cancer.  Colorectal cancer.  Skin cancer.  Lung cancer. What should I know about heart disease, diabetes, and high blood pressure? Blood pressure and heart disease  High blood pressure causes heart disease and increases the risk of stroke. This is more likely to develop in people who have high blood pressure readings, are of African descent, or are overweight.  Have your blood pressure checked: ? Every 3-5 years if you are 40-47 years of age. ? Every year if you are 76 years old or older. Diabetes Have regular diabetes screenings. This checks your fasting blood sugar level. Have the screening done:  Once every three years after age 15 if you are at a normal weight and have a low risk for diabetes.  More often and at a younger age if you are overweight or have a high risk for diabetes. What should I know about preventing infection? Hepatitis B If you have a higher risk for hepatitis B, you should be screened for this virus. Talk with your health care provider to find out if you are at risk for hepatitis B infection. Hepatitis C Testing is recommended for:  Everyone born from [birth year] through 1963.  Anyone with known risk factors for hepatitis C. Sexually  transmitted infections (STIs)  Get screened for STIs, including gonorrhea and chlamydia, if: ? You are sexually active and are younger than 82 years of age. ? You are older than 82 years of age and your health care provider tells you that you are at risk for this type of infection. ? Your sexual activity has changed since you were last screened, and you are at increased risk for chlamydia or gonorrhea. Ask your health care provider if you are at risk.  Ask your health  care provider about whether you are at high risk for HIV. Your health care provider may recommend a prescription medicine to help prevent HIV infection. If you choose to take medicine to prevent HIV, you should first get tested for HIV. You should then be tested every 3 months for as long as you are taking the medicine. Pregnancy  If you are about to stop having your period (premenopausal) and you may become pregnant, seek counseling before you get pregnant.  Take 400 to 800 micrograms (mcg) of folic acid every day if you become pregnant.  Ask for birth control (contraception) if you want to prevent pregnancy. Osteoporosis and menopause Osteoporosis is a disease in which the bones lose minerals and strength with aging. This can result in bone fractures. If you are 68 years old or older, or if you are at risk for osteoporosis and fractures, ask your health care provider if you should:  Be screened for bone loss.  Take a calcium or vitamin D supplement to lower your risk of fractures.  Be given hormone replacement therapy (HRT) to treat symptoms of menopause. Follow these instructions at home: Lifestyle  Do not use any products that contain nicotine or tobacco, such as cigarettes, e-cigarettes, and chewing tobacco. If you need help quitting, ask your health care provider.  Do not use street drugs.  Do not share needles.  Ask your health care provider for help if you need support or information about quitting drugs. Alcohol use  Do not drink alcohol if: ? Your health care provider tells you not to drink. ? You are pregnant, may be pregnant, or are planning to become pregnant.  If you drink alcohol: ? Limit how much you use to 0-1 drink a day. ? Limit intake if you are breastfeeding.  Be aware of how much alcohol is in your drink. In the [place] one drink equals one 12 oz bottle of beer (355 mL), one 5 oz glass of wine (148 mL), or one 1 oz glass of hard liquor (44 mL). General  instructions  Schedule regular health, dental, and eye exams.  Stay current with your vaccines.  Tell your health care provider if: ? You often feel depressed. ? You have ever been abused or do not feel safe at home. Summary  Adopting a healthy lifestyle and getting preventive care are important in promoting health and wellness.  Follow your health care provider's instructions about healthy diet, exercising, and getting tested or screened for diseases.  Follow your health care provider's instructions on monitoring your cholesterol and blood pressure. This information is not intended to replace advice given to you by your health care provider. Make sure you discuss any questions you have with your health care provider. Document Revised: 09/07/2018 Document Reviewed: 09/07/2018 Elsevier Patient Education  2020 [employer].

## 2019-10-19 NOTE — Patient Instructions (Signed)
Marie Rhodes , Thank you for taking time to come for your Medicare Wellness Visit. I appreciate your ongoing commitment to your health goals. Please review the following plan we discussed and let me know if I can assist you in the future.   Screening recommendations/referrals: Colorectal Screening: up to date; last 12/30/15 Mammogram: No longer indicated; but still covered if you would like one  Bone Density: up to date; last 10/14/17  Vision and Dental Exams: Recommended annual ophthalmology exams for early detection of glaucoma and other disorders of the eye Recommended annual dental exams for proper oral hygiene  Diabetic Exams: Diabetic Eye Exam: recommended yearly  Diabetic Foot Exam: recommended yearly; completed today   Vaccinations: Influenza vaccine: completed 9/18/250 Pneumococcal vaccine: up to date; last 01/22/15 Tdap vaccine: up to date; last 10/10/17  Shingles vaccine: Please call your insurance company to determine your out of pocket expense for the Shingrix vaccine. You may receive this vaccine at your local pharmacy.  Advanced directives: Please bring a copy of your POA (Power of Attorney) and/or Living Will to your next appointment.  Goals: Recommend to drink at least 6-8 8oz glasses of water per day and consume a balanced diet rich in fresh fruits and vegetables   Next appointment: Please schedule your Annual Wellness Visit with your Nurse Health Advisor in one year.  Preventive Care 18 Years and Older, Female Preventive care refers to lifestyle choices and visits with your health care provider that can promote health and wellness. What does preventive care include?  A yearly physical exam. This is also called an annual well check.  Dental exams once or twice a year.  Routine eye exams. Ask your health care provider how often you should have your eyes checked.  Personal lifestyle choices, including:  Daily care of your teeth and gums.  Regular physical  activity.  Eating a healthy diet.  Avoiding tobacco and drug use.  Limiting alcohol use.  Practicing safe sex.  Taking low-dose aspirin every day if recommended by your health care provider.  Taking vitamin and mineral supplements as recommended by your health care provider. What happens during an annual well check? The services and screenings done by your health care provider during your annual well check will depend on your age, overall health, lifestyle risk factors, and family history of disease. Counseling  Your health care provider may ask you questions about your:  Alcohol use.  Tobacco use.  Drug use.  Emotional well-being.  Home and relationship well-being.  Sexual activity.  Eating habits.  History of falls.  Memory and ability to understand (cognition).  Work and work Statistician.  Reproductive health. Screening  You may have the following tests or measurements:  Height, weight, and BMI.  Blood pressure.  Lipid and cholesterol levels. These may be checked every 5 years, or more frequently if you are over 3 years old.  Skin check.  Lung cancer screening. You may have this screening every year starting at age 3 if you have a 30-pack-year history of smoking and currently smoke or have quit within the past 15 years.  Fecal occult blood test (FOBT) of the stool. You may have this test every year starting at age 50.  Flexible sigmoidoscopy or colonoscopy. You may have a sigmoidoscopy every 5 years or a colonoscopy every 10 years starting at age 3.  Hepatitis C blood test.  Hepatitis B blood test.  Sexually transmitted disease (STD) testing.  Diabetes screening. This is done by checking your blood  sugar (glucose) after you have not eaten for a while (fasting). You may have this done every 1-3 years.  Bone density scan. This is done to screen for osteoporosis. You may have this done starting at age 39.  Mammogram. This may be done every 1-2  years. Talk to your health care provider about how often you should have regular mammograms. Talk with your health care provider about your test results, treatment options, and if necessary, the need for more tests. Vaccines  Your health care provider may recommend certain vaccines, such as:  Influenza vaccine. This is recommended every year.  Tetanus, diphtheria, and acellular pertussis (Tdap, Td) vaccine. You may need a Td booster every 10 years.  Zoster vaccine. You may need this after age 56.  Pneumococcal 13-valent conjugate (PCV13) vaccine. One dose is recommended after age 75.  Pneumococcal polysaccharide (PPSV23) vaccine. One dose is recommended after age 91. Talk to your health care provider about which screenings and vaccines you need and how often you need them. This information is not intended to replace advice given to you by your health care provider. Make sure you discuss any questions you have with your health care provider. Document Released: 10/11/2015 Document Revised: 06/03/2016 Document Reviewed: 07/16/2015 Elsevier Interactive Patient Education  2017 [place] Prevention in the Home Falls can cause injuries. They can happen to people of all ages. There are many things you can do to make your home safe and to help prevent falls. What can I do on the outside of my home?  Regularly fix the edges of walkways and driveways and fix any cracks.  Remove anything that might make you trip as you walk through a door, such as a raised step or threshold.  Trim any bushes or trees on the path to your home.  Use bright outdoor lighting.  Clear any walking paths of anything that might make someone trip, such as rocks or tools.  Regularly check to see if handrails are loose or broken. Make sure that both sides of any steps have handrails.  Any raised decks and porches should have guardrails on the edges.  Have any leaves, snow, or ice cleared regularly.  Use  sand or salt on walking paths during winter.  Clean up any spills in your garage right away. This includes oil or grease spills. What can I do in the bathroom?  Use night lights.  Install grab bars by the toilet and in the tub and shower. Do not use towel bars as grab bars.  Use non-skid mats or decals in the tub or shower.  If you need to sit down in the shower, use a plastic, non-slip stool.  Keep the floor dry. Clean up any water that spills on the floor as soon as it happens.  Remove soap buildup in the tub or shower regularly.  Attach bath mats securely with double-sided non-slip rug tape.  Do not have throw rugs and other things on the floor that can make you trip. What can I do in the bedroom?  Use night lights.  Make sure that you have a light by your bed that is easy to reach.  Do not use any sheets or blankets that are too big for your bed. They should not hang down onto the floor.  Have a firm chair that has side arms. You can use this for support while you get dressed.  Do not have throw rugs and other things on the floor that can  make you trip. What can I do in the kitchen?  Clean up any spills right away.  Avoid walking on wet floors.  Keep items that you use a lot in easy-to-reach places.  If you need to reach something above you, use a strong step stool that has a grab bar.  Keep electrical cords out of the way.  Do not use floor polish or wax that makes floors slippery. If you must use wax, use non-skid floor wax.  Do not have throw rugs and other things on the floor that can make you trip. What can I do with my stairs?  Do not leave any items on the stairs.  Make sure that there are handrails on both sides of the stairs and use them. Fix handrails that are broken or loose. Make sure that handrails are as long as the stairways.  Check any carpeting to make sure that it is firmly attached to the stairs. Fix any carpet that is loose or worn.  Avoid  having throw rugs at the top or bottom of the stairs. If you do have throw rugs, attach them to the floor with carpet tape.  Make sure that you have a light switch at the top of the stairs and the bottom of the stairs. If you do not have them, ask someone to add them for you. What else can I do to help prevent falls?  Wear shoes that:  Do not have high heels.  Have rubber bottoms.  Are comfortable and fit you well.  Are closed at the toe. Do not wear sandals.  If you use a stepladder:  Make sure that it is fully opened. Do not climb a closed stepladder.  Make sure that both sides of the stepladder are locked into place.  Ask someone to hold it for you, if possible.  Clearly mark and make sure that you can see:  Any grab bars or handrails.  First and last steps.  Where the edge of each step is.  Use tools that help you move around (mobility aids) if they are needed. These include:  Canes.  Walkers.  Scooters.  Crutches.  Turn on the lights when you go into a dark area. Replace any light bulbs as soon as they burn out.  Set up your furniture so you have a clear path. Avoid moving your furniture around.  If any of your floors are uneven, fix them.  If there are any pets around you, be aware of where they are.  Review your medicines with your doctor. Some medicines can make you feel dizzy. This can increase your chance of falling. Ask your doctor what other things that you can do to help prevent falls. This information is not intended to replace advice given to you by your health care provider. Make sure you discuss any questions you have with your health care provider. Document Released: 07/11/2009 Document Revised: 02/20/2016 Document Reviewed: 10/19/2014 Elsevier Interactive Patient Education  2017 [employer].

## 2019-10-20 ENCOUNTER — Encounter: Payer: Medicare Other | Admitting: Physician Assistant

## 2019-10-23 ENCOUNTER — Other Ambulatory Visit: Payer: Self-pay

## 2019-10-23 ENCOUNTER — Other Ambulatory Visit: Payer: Self-pay | Admitting: Physician Assistant

## 2019-10-23 ENCOUNTER — Ambulatory Visit: Payer: Medicare PPO | Admitting: Endocrinology

## 2019-10-23 ENCOUNTER — Encounter: Payer: Self-pay | Admitting: Endocrinology

## 2019-10-23 VITALS — BP 120/64 | HR 114 | Ht 66.0 in | Wt 240.6 lb

## 2019-10-23 DIAGNOSIS — E119 Type 2 diabetes mellitus without complications: Secondary | ICD-10-CM

## 2019-10-23 LAB — POCT GLYCOSYLATED HEMOGLOBIN (HGB A1C): Hemoglobin A1C: 8.3 % — AB (ref 4.0–5.6)

## 2019-10-23 MED ORDER — LOSARTAN POTASSIUM 25 MG PO TABS: 25.0000 mg | ORAL_TABLET | Freq: Every day | ORAL | 1 refills | Status: DC

## 2019-10-23 NOTE — Progress Notes (Signed)
Subjective:    Patient ID: Marie Rhodes, female    DOB: 06/16/1936, 84 y.o.   MRN: [id number]  HPI Pt returns for f/u of diabetes mellitus:  DM type: 2 Dx'ed: 8616 Complications: renal insufficiency.   Therapy: 2 oral meds. GDM: never DKA: never Severe hypoglycemia: never Pancreatitis: never Other: she has never taken insulin; crohn's is a relative contraindication to GLP meds.  Interval history: pt states she feels well in general.  She takes meds as rx'ed.  She says cbg's are in the 100's.  Pt says tachycardia is situational.   Past Medical History:  Diagnosis Date  . ANXIETY 04/27/2007  . CROHN'S DISEASE, SMALL INTESTINE 06/08/2008   ileum, cecum  . DEPRESSION 04/27/2007  . Fatty tumor    left shoulder  . GOITER, MULTINODULAR 05/04/2008  . Hyperlipemia   . Molar pregnancy 1967  . OBESITY 08/01/2010  . UNSPECIFIED ANEMIA 05/04/2008   Mild Anemia  . Vitamin D deficiency 04/08/2018    Past Surgical History:  Procedure Laterality Date  . APPENDECTOMY    . BIOPSY THYROID    . COLONOSCOPY  08/10/2006   normal  . LEFT OOPHORECTOMY  1992  . MECKEL DIVERTICULUM EXCISION     Ileal and cecal resection     Social History   Socioeconomic History  . Marital status: Widowed    Spouse name: Not on file  . Number of children: Not on file  . Years of education: Not on file  . Highest education level: Not on file  Occupational History  . 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 Smoking status: Former Smoker    Quit date: 05/29/1988    Years since quitting: 31.4  . Smokeless tobacco: Never Used  Substance and Sexual Activity  . Alcohol use: No  . Drug use: No  . Sexual activity: Not on file  Other Topics Concern  . Not on file  Social History Narrative   Patient is widowed --husband died from brain aneurysm, she is a retired Pharmacist, hospital (second grade) and currently works at an [employer] 2 days a week.   Former Smoker (college only),  no alcohol or drug use   Both of her grandchildren lived with her while they went to college    As of November 2019, she lives alone   Social Determinants of Radio broadcast assistant Strain:   . Difficulty of Paying Living Expenses: Not on file  Food Insecurity:   . Worried About Charity fundraiser in the Last Year: Not on file  . Ran Out of Food in the Last Year: Not on file  Transportation Needs:   . Lack of Transportation (Medical): Not on file  . Lack of Transportation (Non-Medical): Not on file  Physical Activity:   . Days of Exercise per Week: Not on file  . Minutes of Exercise per Session: Not on file  Stress:   . Feeling of Stress : Not on file  Social Connections:   . Frequency of Communication with Friends and Family: Not on file  . Frequency of Social Gatherings with Friends and Family: Not on file  . Attends Religious Services: Not on file  . Active Member of Clubs or Organizations: Not on file  . Attends Archivist Meetings: Not on file  . Marital Status: Not on file  Intimate Partner Violence:   . Fear of Current or Ex-Partner: Not on file  . Emotionally  Abused: Not on file  . Physically Abused: Not on file  . Sexually Abused: Not on file    Current Outpatient Medications on File Prior to Visit  Medication Sig Dispense Refill  . ALPRAZolam (XANAX) 0.25 MG tablet TAKE AS NEEDED FOR ANXIETY. 30 tablet 0  . ferrous sulfate 325 (65 FE) MG tablet Take 1 tablet (325 mg total) by mouth 2 (two) times daily.  3  . losartan-hydrochlorothiazide (HYZAAR) 50-12.5 MG tablet TAKE 1/2 TABLET DAILY. 45 tablet 0  . mesalamine (PENTASA) 500 MG CR capsule TAKE (1) CAPSULE TWICE DAILY. 60 capsule 11  . metFORMIN (GLUCOPHAGE-XR) 500 MG 24 hr tablet Take 1 tablet (500 mg total) by mouth daily. 90 tablet 3  . Multiple Vitamin (MULTIVITAMIN) tablet Take 1 tablet by mouth at bedtime.     . ONE TOUCH ULTRA TEST test strip CHECK BLOOD SUGAR ONCE A DAY. 50 each 2  .  pioglitazone (ACTOS) 15 MG tablet TAKE 1 TABLET EACH DAY. 30 tablet 0  . vitamin B-12 (CYANOCOBALAMIN) 1000 MCG tablet Take 1,000 mcg by mouth 2 (two) times daily.     . Vitamin D, Cholecalciferol, 25 MCG (1000 UT) CAPS Take 1 capsule by mouth daily.     No current facility-administered medications on file prior to visit.    No Known Allergies  Family History  Problem Relation Age of Onset  . Breast cancer Mother 32  . Kidney disease Mother   . Diabetes Mother   . Kidney cancer Mother        Renal Cell Carcinoma  . Skin cancer Father        mets  . Diabetes Father   . Melanoma Sister   . Diabetes Brother   . Tongue cancer Brother 74  . Stroke Maternal Grandmother   . Heart disease Paternal Grandmother   . Colon cancer Paternal Uncle     BP 120/64 (BP Location: Left Arm, Patient Position: Sitting, Cuff Size: Large)   Pulse (!) 114   Ht 5' 6"  (1.676 m)   Wt 240 lb 9.6 oz (109.1 kg)   SpO2 93%   BMI 38.83 kg/m    Review of Systems She has lost weight, due to her efforts.     Objective:   Physical Exam VITAL SIGNS:  See vs page GENERAL: no distress Pulses: dorsalis pedis intact bilat.   MSK: no deformity of the feet CV: trace bilat leg edema Skin:  no ulcer on the feet.  normal color and temp on the feet. Neuro: sensation is intact to touch on the feet  Lab Results  Component Value Date   HGBA1C 8.3 (A) 10/23/2019   Lab Results  Component Value Date   CREATININE 1.31 (H) 10/19/2019   BUN 32 (H) 10/19/2019   NA 134 (L) 10/19/2019   K 4.4 10/19/2019   CL 98 10/19/2019   CO2 28 10/19/2019   Lab Results  Component Value Date   TSH 1.53 06/16/2019       Assessment & Plan:  Tachycardia: persistent.  She should f/u with PCP Type 2 DM: she needs increased rx Renal failure: we need to phase out metformin when glycemic control allows Edema: mild, poss due to pioglitazone.  We'll follow. Crohn's Dz: I hesitate to rx GLP med here.  Patient Instructions    check your blood sugar twice a day.  vary the time of day when you check, between before the 3 meals, and at bedtime.  also check if you have symptoms  of your blood sugar being too high or too low.  please keep a record of the readings and bring it to your next appointment here (or you can bring the meter itself).  You can write it on any piece of paper.  please call us sooner if your blood sugar goes below 70, or if you have a lot of readings over 200. we should add another medication.  Options are Farxiga, bromocriptine, or repaglinde (generic, but it is 3 times a day).  Please think it over, and let me know.    Because your kidneys are off, we should also phase out the metformin.  Please come back for a follow-up appointment in 2 months.

## 2019-10-23 NOTE — Patient Instructions (Addendum)
check your blood sugar twice a day.  vary the time of day when you check, between before the 3 meals, and at bedtime.  also check if you have symptoms of your blood sugar being too high or too low.  please keep a record of the readings and bring it to your next appointment here (or you can bring the meter itself).  You can write it on any piece of paper.  please call us sooner if your blood sugar goes below 70, or if you have a lot of readings over 200. we should add another medication.  Options are Farxiga, bromocriptine, or repaglinde (generic, but it is 3 times a day).  Please think it over, and let me know.    Because your kidneys are off, we should also phase out the metformin.  Please come back for a follow-up appointment in 2 months.

## 2019-11-15 ENCOUNTER — Other Ambulatory Visit: Payer: Self-pay | Admitting: Endocrinology

## 2019-11-21 ENCOUNTER — Encounter: Payer: Self-pay | Admitting: Endocrinology

## 2019-11-21 ENCOUNTER — Other Ambulatory Visit: Payer: Self-pay | Admitting: Endocrinology

## 2019-11-21 MED ORDER — REPAGLINIDE 1 MG PO TABS: 1.0000 mg | ORAL_TABLET | Freq: Three times a day (TID) | ORAL | 5 refills | Status: DC

## 2019-11-24 ENCOUNTER — Other Ambulatory Visit: Payer: Self-pay

## 2019-11-24 ENCOUNTER — Ambulatory Visit (INDEPENDENT_AMBULATORY_CARE_PROVIDER_SITE_OTHER): Payer: Medicare PPO | Admitting: Physician Assistant

## 2019-11-24 ENCOUNTER — Encounter: Payer: Self-pay | Admitting: Physician Assistant

## 2019-11-24 VITALS — BP 148/80 | HR 105 | Temp 97.2°F | Ht 66.0 in | Wt 243.5 lb

## 2019-11-24 DIAGNOSIS — N289 Disorder of kidney and ureter, unspecified: Secondary | ICD-10-CM | POA: Diagnosis not present

## 2019-11-24 DIAGNOSIS — I10 Essential (primary) hypertension: Secondary | ICD-10-CM

## 2019-11-24 LAB — BASIC METABOLIC PANEL
BUN/Creatinine Ratio: 18 (calc) (ref 6–22)
BUN: 19 mg/dL (ref 7–25)
CO2: 26 mmol/L (ref 20–32)
Calcium: 10.7 mg/dL — ABNORMAL HIGH (ref 8.6–10.4)
Chloride: 105 mmol/L (ref 98–110)
Creat: 1.08 mg/dL — ABNORMAL HIGH (ref 0.60–0.88)
Glucose, Bld: 147 mg/dL — ABNORMAL HIGH (ref 65–99)
Potassium: 4.1 mmol/L (ref 3.5–5.3)
Sodium: 140 mmol/L (ref 135–146)

## 2019-11-24 LAB — PHOSPHORUS: Phosphorus: 2.9 mg/dL (ref 2.1–4.3)

## 2019-11-24 NOTE — Patient Instructions (Signed)
It was great to see you!  Our goal blood pressure is 140/80 or lower.  I will be in touch with labs.  Come back next week for a blood pressure check -- I will have the handout for you at that time.  Let's follow-up in 1 week, sooner if you have concerns.  Take care,  Inda Coke PA-C

## 2019-11-24 NOTE — Progress Notes (Signed)
Marie Rhodes is a 84 y.o. female is here to follow up on Hypertension.  I acted as a Education administrator for [employer], PA-C Anselmo Pickler, LPN  History of Present Illness:   Chief Complaint  Patient presents with  . Hypertension    HPI   Hypertension; Renal Insufficiency Pt following up today on blood pressure. Medication changed last visit 10/19/19 due to labs --> changed from 1/2 tablet of Losartan 50 - HCTZ 12.5 mg to 1 tablet Losartan 25 mg daily. Pt denies headaches, dizziness, blurred vision, chest pain, SOB or lower leg edema. Pt is complaint with medication. Denies excessive caffeine intake, stimulant usage, excessive alcohol intake or increase in salt consumption.  BP Readings from Last 3 Encounters:  11/24/19 (!) 148/80  10/23/19 120/64  10/19/19 124/70   Wt Readings from Last 3 Encounters:  11/24/19 243 lb 8 oz (110.5 kg)  10/23/19 240 lb 9.6 oz (109.1 kg)  10/19/19 242 lb 8.1 oz (110 kg)    There are no preventive care reminders to display for this patient.  Past Medical History:  Diagnosis Date  . ANXIETY 04/27/2007  . CROHN'S DISEASE, SMALL INTESTINE 06/08/2008   ileum, cecum  . DEPRESSION 04/27/2007  . Fatty tumor    left shoulder  . GOITER, MULTINODULAR 05/04/2008  . Hyperlipemia   . Molar pregnancy 1967  . OBESITY 08/01/2010  . UNSPECIFIED ANEMIA 05/04/2008   Mild Anemia  . Vitamin D deficiency 04/08/2018     Social History   Socioeconomic History  . Marital status: Widowed    Spouse name: Not on file  . Number of children: Not on file  . Years of education: Not on file  . Highest education level: Not on file  Occupational History  . 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 Smoking status: Former Smoker    Quit date: 05/29/1988    Years since quitting: 31.5  . Smokeless tobacco: Never Used  Substance and Sexual Activity  . Alcohol use: No  . Drug use: No  . Sexual activity: Not on file  Other Topics Concern   . Not on file  Social History Narrative   Patient is widowed --husband died from brain aneurysm, she is a retired Pharmacist, hospital (second grade) and currently works at an [place] 2 days a week.   Former Smoker (college only), no alcohol or drug use   Both of her grandchildren lived with her while they went to college    As of November 2019, she lives alone   Social Determinants of Radio broadcast assistant Strain:   . Difficulty of Paying Living Expenses: Not on file  Food Insecurity:   . Worried About Charity fundraiser in the Last Year: Not on file  . Ran Out of Food in the Last Year: Not on file  Transportation Needs:   . Lack of Transportation (Medical): Not on file  . Lack of Transportation (Non-Medical): Not on file  Physical Activity:   . Days of Exercise per Week: Not on file  . Minutes of Exercise per Session: Not on file  Stress:   . Feeling of Stress : Not on file  Social Connections:   . Frequency of Communication with Friends and Family: Not on file  . Frequency of Social Gatherings with Friends and Family: Not on file  . Attends Religious Services: Not on file  . Active Member of Clubs or Organizations: Not on file  .  Attends Archivist Meetings: Not on file  . Marital Status: Not on file  Intimate Partner Violence:   . Fear of Current or Ex-Partner: Not on file  . Emotionally Abused: Not on file  . Physically Abused: Not on file  . Sexually Abused: Not on file    Past Surgical History:  Procedure Laterality Date  . APPENDECTOMY    . BIOPSY THYROID    . COLONOSCOPY  08/10/2006   normal  . LEFT OOPHORECTOMY  1992  . MECKEL DIVERTICULUM EXCISION     Ileal and cecal resection     Family History  Problem Relation Age of Onset  . Breast cancer Mother 58  . Kidney disease Mother        Dialysis  . Diabetes Mother   . Kidney cancer Mother        Renal Cell Carcinoma  . Skin cancer Father        mets  . Diabetes Father   . Melanoma  Sister   . Diabetes Brother   . Tongue cancer Brother 31  . Stroke Maternal Grandmother   . Heart disease Paternal Grandmother   . Colon cancer Paternal Uncle     PMHx, SurgHx, SocialHx, FamHx, Medications, and Allergies were reviewed in the Visit Navigator and updated as appropriate.   Patient Active Problem List   Diagnosis Date Noted  . Vitamin D deficiency 04/08/2018  . Iron deficiency 02/17/2018  . B12 deficiency 02/17/2018  . Shoulder swelling 10/08/2017  . Hypercalcemia 02/08/2017  . Arthralgia 01/22/2015  . Diabetes ([place]) 03/05/2014  . Abnormal ECG 09/12/2012  . Retinal detachment 09/12/2012  . Cough 06/28/2012  . Menopause 09/10/2011  . Renal insufficiency 12/22/2010  . OBESITY 08/01/2010  . HYPERCHOLESTEROLEMIA 06/24/2010  . NASH (nonalcoholic steatohepatitis) 06/23/2009  . CROHN'S DISEASE, SMALL INTESTINE 06/08/2008  . GOITER, MULTINODULAR 05/04/2008  . GERD 01/19/2008  . ANXIETY 04/27/2007  . DEPRESSION 04/27/2007  . Essential hypertension 04/27/2007    Social History   Tobacco Use  . Smoking status: Former Smoker    Quit date: 05/29/1988    Years since quitting: 31.5  . Smokeless tobacco: Never Used  Substance Use Topics  . Alcohol use: No  . Drug use: No    Current Medications and Allergies:    Current Outpatient Medications:  .  ALPRAZolam (XANAX) 0.25 MG tablet, TAKE AS NEEDED FOR ANXIETY., Disp: 30 tablet, Rfl: 0 .  ferrous sulfate 325 (65 FE) MG tablet, Take 1 tablet (325 mg total) by mouth 2 (two) times daily., Disp: , Rfl: 3 .  losartan (COZAAR) 25 MG tablet, Take 1 tablet (25 mg total) by mouth daily., Disp: 30 tablet, Rfl: 1 .  Multiple Vitamin (MULTIVITAMIN) tablet, Take 1 tablet by mouth at bedtime. , Disp: , Rfl:  .  ONE TOUCH ULTRA TEST test strip, CHECK BLOOD SUGAR ONCE A DAY., Disp: 50 each, Rfl: 2 .  pioglitazone (ACTOS) 15 MG tablet, TAKE 1 TABLET EACH DAY., Disp: 30 tablet, Rfl: 0 .  repaglinide (PRANDIN) 1 MG tablet, Take 1 tablet  (1 mg total) by mouth 3 (three) times daily before meals., Disp: 90 tablet, Rfl: 5 .  vitamin B-12 (CYANOCOBALAMIN) 1000 MCG tablet, Take 1,000 mcg by mouth 2 (two) times daily. , Disp: , Rfl:  .  Vitamin D, Cholecalciferol, 25 MCG (1000 UT) CAPS, Take 1 capsule by mouth daily., Disp: , Rfl:   No Known Allergies  Review of Systems   ROS  Negative unless otherwise specified  per HPI.  Vitals:   Vitals:   11/24/19 1413 11/24/19 1439  BP: (!) 150/80 (!) 148/80  Pulse: (!) 106 (!) 105  Temp: (!) 97.2 F (36.2 C)   TempSrc: Temporal   SpO2: 97%   Weight: 243 lb 8 oz (110.5 kg)   Height: 5' 6"  (1.676 m)      Body mass index is 39.3 kg/m.   Physical Exam:    Physical Exam Vitals and nursing note reviewed.  Constitutional:      General: She is not in acute distress.    Appearance: She is well-developed. She is not ill-appearing or toxic-appearing.  Cardiovascular:     Rate and Rhythm: Normal rate and regular rhythm.     Pulses: Normal pulses.     Heart sounds: Normal heart sounds, S1 normal and S2 normal.     Comments: No LE edema Pulmonary:     Effort: Pulmonary effort is normal.     Breath sounds: Normal breath sounds.  Skin:    General: Skin is warm and dry.  Neurological:     Mental Status: She is alert.     GCS: GCS eye subscore is 4. GCS verbal subscore is 5. GCS motor subscore is 6.  Psychiatric:        Speech: Speech normal.        Behavior: Behavior normal. Behavior is cooperative.      Assessment and Plan:    Altagracia was seen today for hypertension.  Diagnoses and all orders for this visit:  Renal insufficiency; Essential hypertension BP is slightly above goal, will have her come back next week for BP check. Low threshold to increase Losartan to 50 mg if needed. Will update renal function and phos level today. Discussed that if she has significant decline, will need renal referral. She verbalized understanding to plan and is in agreement. -     Basic  metabolic panel -     Phosphorus  Reviewed expectations re: course of current medical issues. Discussed self-management of symptoms. Outlined signs and symptoms indicating need for more acute intervention. Patient verbalized understanding and all questions were answered. See orders for this visit as documented in the electronic medical record. Patient received an After Visit Summary.  CMA or LPN served as scribe during this visit. History, Physical, and Plan performed by medical provider. The above documentation has been reviewed and is accurate and complete.  Inda Coke, PA-C [place], [place] 11/24/2019  Follow-up: No follow-ups on file.

## 2019-12-01 ENCOUNTER — Other Ambulatory Visit: Payer: Self-pay

## 2019-12-01 ENCOUNTER — Encounter: Payer: Self-pay | Admitting: Physician Assistant

## 2019-12-01 ENCOUNTER — Ambulatory Visit: Payer: Medicare PPO | Admitting: Physician Assistant

## 2019-12-01 ENCOUNTER — Ambulatory Visit (INDEPENDENT_AMBULATORY_CARE_PROVIDER_SITE_OTHER): Payer: Medicare PPO | Admitting: Physician Assistant

## 2019-12-01 VITALS — BP 140/76 | HR 108 | Temp 97.2°F | Ht 66.0 in | Wt 242.5 lb

## 2019-12-01 DIAGNOSIS — I10 Essential (primary) hypertension: Secondary | ICD-10-CM

## 2019-12-01 NOTE — Progress Notes (Signed)
Hypertension Pt following up today on blood pressure only. Currently taking Losartan 25 mg daily. Pt denies headaches, dizziness, blurred vision, chest pain, SOB or lower leg edema. Denies excessive caffeine intake, stimulant usage, excessive alcohol intake or increase in salt consumption.  Lukah Goswami notified of blood pressure. Pt to continue to monitor blood pressure and follow up in 3 months unless problems.  I agree with the above documentation.  No charge.  Inda Coke PA-C

## 2019-12-22 ENCOUNTER — Ambulatory Visit: Payer: Medicare PPO | Admitting: Endocrinology

## 2019-12-22 ENCOUNTER — Encounter: Payer: Self-pay | Admitting: Endocrinology

## 2019-12-22 ENCOUNTER — Other Ambulatory Visit: Payer: Self-pay | Admitting: Physician Assistant

## 2019-12-22 ENCOUNTER — Other Ambulatory Visit: Payer: Self-pay

## 2019-12-22 VITALS — BP 142/80 | HR 110 | Ht 66.0 in | Wt 242.0 lb

## 2019-12-22 DIAGNOSIS — E119 Type 2 diabetes mellitus without complications: Secondary | ICD-10-CM | POA: Diagnosis not present

## 2019-12-22 LAB — POCT GLYCOSYLATED HEMOGLOBIN (HGB A1C): Hemoglobin A1C: 7.2 % — AB (ref 4.0–5.6)

## 2019-12-22 NOTE — Patient Instructions (Addendum)
Your blood pressure and heart rate are high today.  Please see your primary care provider soon, to have these rechecked check your blood sugar twice a day.  vary the time of day when you check, between before the 3 meals, and at bedtime.  also check if you have symptoms of your blood sugar being too high or too low.  please keep a record of the readings and bring it to your next appointment here (or you can bring the meter itself).  You can write it on any piece of paper.  please call us sooner if your blood sugar goes below 70, or if you have a lot of readings over 200. Please stop taking the pioglitazone, and: Please continue the same repaglinide.   Please come back for a follow-up appointment in 3 months.

## 2019-12-22 NOTE — Progress Notes (Signed)
Subjective:    Patient ID: Marie Rhodes, female    DOB: [birth year], 84 y.o.   MRN: [id number]  HPI Pt returns for f/u of diabetes mellitus:  DM type: 2 Dx'ed: 8309 Complications: CRI  Therapy: 2 oral meds. GDM: never DKA: never Severe hypoglycemia: never Pancreatitis: never Other: she has never taken insulin; Crohn's is a relative contraindication to GLP meds.  Interval history: pt states she feels well in general.  She takes meds as rx'ed.  She says cbg's are in the 100's.   Past Medical History:  Diagnosis Date  . ANXIETY 04/27/2007  . CROHN'S DISEASE, SMALL INTESTINE 06/08/2008   ileum, cecum  . DEPRESSION 04/27/2007  . Fatty tumor    left shoulder  . GOITER, MULTINODULAR 05/04/2008  . Hyperlipemia   . Molar pregnancy 1967  . OBESITY 08/01/2010  . UNSPECIFIED ANEMIA 05/04/2008   Mild Anemia  . Vitamin D deficiency 04/08/2018    Past Surgical History:  Procedure Laterality Date  . APPENDECTOMY    . BIOPSY THYROID    . COLONOSCOPY  08/10/2006   normal  . LEFT OOPHORECTOMY  1992  . MECKEL DIVERTICULUM EXCISION     Ileal and cecal resection     Social History   Socioeconomic History  . Marital status: Widowed    Spouse name: Not on file  . Number of children: Not on file  . Years of education: Not on file  . Highest education level: Not on file  Occupational History  . 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 Smoking status: Former Smoker    Quit date: 05/29/1988    Years since quitting: 31.5  . Smokeless tobacco: Never Used  Substance and Sexual Activity  . Alcohol use: No  . Drug use: No  . Sexual activity: Not on file  Other Topics Concern  . 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   Former Smoker (college only), no alcohol or drug use   Both of her grandchildren  lived with her while they went to college    As of November 2019, she lives alone   Social Determinants of Radio broadcast assistant Strain:   . Difficulty of Paying Living Expenses:   Food Insecurity:   . Worried About Charity fundraiser in the Last Year:   . Arboriculturist in the Last Year:   Transportation Needs:   . Film/video editor (Medical):   Marland Kitchen Lack of Transportation (Non-Medical):   Physical Activity:   . Days of Exercise per Week:   . Minutes of Exercise per Session:   Stress:   . Feeling of Stress :   Social Connections:   . Frequency of Communication with Friends and Family:   . Frequency of Social Gatherings with Friends and Family:   . Attends Religious Services:   . Active Member of Clubs or Organizations:   . Attends Archivist Meetings:   Marland Kitchen Marital Status:   Intimate Partner Violence:   . Fear of Current or Ex-Partner:   . Emotionally Abused:   Marland Kitchen Physically Abused:   . Sexually Abused:     Current Outpatient Medications on File Prior to Visit  Medication Sig Dispense Refill  . ALPRAZolam (XANAX) 0.25 MG tablet TAKE AS NEEDED  FOR ANXIETY. 30 tablet 0  . ferrous sulfate 325 (65 FE) MG tablet Take 1 tablet (325 mg total) by mouth 2 (two) times daily.  3  . losartan (COZAAR) 25 MG tablet Take 1 tablet (25 mg total) by mouth daily. 30 tablet 1  . Multiple Vitamin (MULTIVITAMIN) tablet Take 1 tablet by mouth at bedtime.     . ONE TOUCH ULTRA TEST test strip CHECK BLOOD SUGAR ONCE A DAY. 50 each 2  . repaglinide (PRANDIN) 1 MG tablet Take 1 tablet (1 mg total) by mouth 3 (three) times daily before meals. 90 tablet 5  . vitamin B-12 (CYANOCOBALAMIN) 1000 MCG tablet Take 1,000 mcg by mouth 2 (two) times daily.     . Vitamin D, Cholecalciferol, 25 MCG (1000 UT) CAPS Take 1 capsule by mouth daily.     No current facility-administered medications on file prior to visit.    No Known Allergies  Family History  Problem Relation Age of Onset  .  Breast cancer Mother 27  . Kidney disease Mother        Dialysis  . Diabetes Mother   . Kidney cancer Mother        Renal Cell Carcinoma  . Skin cancer Father        mets  . Diabetes Father   . Melanoma Sister   . Diabetes Brother   . Tongue cancer Brother 52  . Stroke Maternal Grandmother   . Heart disease Paternal Grandmother   . Colon cancer Paternal Uncle     BP (!) 142/80   Pulse (!) 110   Ht 5' 6"  (1.676 m)   Wt 242 lb (109.8 kg)   SpO2 94%   BMI 39.06 kg/m    Review of Systems She denies hypoglycemia    Objective:   Physical Exam VITAL SIGNS:  See vs page GENERAL: no distress Pulses: dorsalis pedis intact bilat.   MSK: no deformity of the feet CV: 1+ bilat leg edema. Skin:  no ulcer on the feet.  normal color and temp on the feet. Neuro: sensation is intact to touch on the feet  Lab Results  Component Value Date   HGBA1C 7.2 (A) 12/22/2019   Lab Results  Component Value Date   TSH 1.53 06/16/2019   Lab Results  Component Value Date   CREATININE 1.08 (H) 11/24/2019   BUN 19 11/24/2019   NA 140 11/24/2019   K 4.1 11/24/2019   CL 105 11/24/2019   CO2 26 11/24/2019       Assessment & Plan:  HTN and tachycardia are noted today Type 2 DM: well-controlled Edema: side effect of pioglitazone   Patient Instructions  Your blood pressure and heart rate are high today.  Please see your primary care provider soon, to have these rechecked check your blood sugar twice a day.  vary the time of day when you check, between before the 3 meals, and at bedtime.  also check if you have symptoms of your blood sugar being too high or too low.  please keep a record of the readings and bring it to your next appointment here (or you can bring the meter itself).  You can write it on any piece of paper.  please call us sooner if your blood sugar goes below 70, or if you have a lot of readings over 200. Please stop taking the pioglitazone, and: Please continue the same  repaglinide.   Please come back for a follow-up appointment in 3 months.

## 2020-02-23 ENCOUNTER — Other Ambulatory Visit: Payer: Self-pay | Admitting: Physician Assistant

## 2020-03-08 ENCOUNTER — Ambulatory Visit: Payer: Medicare PPO | Admitting: Physician Assistant

## 2020-03-08 ENCOUNTER — Encounter: Payer: Self-pay | Admitting: Physician Assistant

## 2020-03-08 ENCOUNTER — Other Ambulatory Visit: Payer: Self-pay

## 2020-03-08 VITALS — BP 144/86 | HR 105 | Temp 98.0°F | Ht 66.0 in | Wt 245.0 lb

## 2020-03-08 DIAGNOSIS — I10 Essential (primary) hypertension: Secondary | ICD-10-CM

## 2020-03-08 DIAGNOSIS — N289 Disorder of kidney and ureter, unspecified: Secondary | ICD-10-CM

## 2020-03-08 DIAGNOSIS — M79676 Pain in unspecified toe(s): Secondary | ICD-10-CM

## 2020-03-08 LAB — BASIC METABOLIC PANEL
BUN: 24 mg/dL — ABNORMAL HIGH (ref 6–23)
CO2: 27 mEq/L (ref 19–32)
Calcium: 9.9 mg/dL (ref 8.4–10.5)
Chloride: 102 mEq/L (ref 96–112)
Creatinine, Ser: 1.1 mg/dL (ref 0.40–1.20)
GFR: 47.58 mL/min — ABNORMAL LOW (ref >60.00)
Glucose, Bld: 279 mg/dL — ABNORMAL HIGH (ref 70–99)
Potassium: 4.3 mEq/L (ref 3.5–5.1)
Sodium: 136 mEq/L (ref 135–145)

## 2020-03-08 MED ORDER — LOSARTAN POTASSIUM 50 MG PO TABS: 50.0000 mg | ORAL_TABLET | Freq: Every day | ORAL | 1 refills | Status: DC

## 2020-03-08 NOTE — Patient Instructions (Signed)
It was great to see you!  Let's increase Losartan to 50 mg daily. I will be in touch with your lab results.  Let's follow-up in 3 months, sooner if you have concerns.  Take care,  Inda Coke PA-C

## 2020-03-08 NOTE — Progress Notes (Signed)
Marie Rhodes is a 84 y.o. female here for a follow up of a pre-existing problem.  History of Present Illness:   Chief Complaint  Patient presents with   Hypertension    HPI   HTN Currently taking Losartan 25 mg. At home blood pressure readings are: 140-150/80. Patient denies chest pain, SOB, blurred vision, dizziness, unusual headaches, lower leg swelling. Patient is compliant with medication. Denies excessive caffeine intake, stimulant usage, excessive alcohol intake, or increase in salt consumption.  BP Readings from Last 3 Encounters:  03/08/20 (!) 144/86  12/22/19 (!) 142/80  12/01/19 140/76   R fourth toe concerns In the past few week she has noticed that her right fourth toe has started to curl under and is causing her pain when she walks.  She denies: Trauma, numbness or tingling, ulcerations.  Past Medical History:  Diagnosis Date   ANXIETY 04/27/2007   CROHN'S DISEASE, SMALL INTESTINE 06/08/2008   ileum, cecum   DEPRESSION 04/27/2007   Fatty tumor    left shoulder   GOITER, MULTINODULAR 05/04/2008   Hyperlipemia    Molar pregnancy 1967   OBESITY 08/01/2010   UNSPECIFIED ANEMIA 05/04/2008   Mild Anemia   Vitamin D deficiency 04/08/2018     Social History   Tobacco Use   Smoking status: Former Smoker    Quit date: 05/29/1988    Years since quitting: 31.7   Smokeless tobacco: Never Used  Substance Use Topics   Alcohol use: No   Drug use: No    Past Surgical History:  Procedure Laterality Date   APPENDECTOMY     BIOPSY THYROID     COLONOSCOPY  08/10/2006   normal   LEFT OOPHORECTOMY  1992   MECKEL DIVERTICULUM EXCISION     Ileal and cecal resection     Family History  Problem Relation Age of Onset   Breast cancer Mother 26   Kidney disease Mother        Dialysis   Diabetes Mother    Kidney cancer Mother        Renal Cell Carcinoma   Skin cancer Father        mets   Diabetes Father    Melanoma Sister    Diabetes Brother     Tongue cancer Brother 60   Stroke Maternal Grandmother    Heart disease Paternal Grandmother    Colon cancer Paternal Uncle     No Known Allergies  Current Medications:   Current Outpatient Medications:    ALPRAZolam (XANAX) 0.25 MG tablet, TAKE AS NEEDED FOR ANXIETY., Disp: 30 tablet, Rfl: 0   ferrous sulfate 325 (65 FE) MG tablet, Take 1 tablet (325 mg total) by mouth 2 (two) times daily., Disp: , Rfl: 3   Multiple Vitamin (MULTIVITAMIN) tablet, Take 1 tablet by mouth at bedtime. , Disp: , Rfl:    ONE TOUCH ULTRA TEST test strip, CHECK BLOOD SUGAR ONCE A DAY., Disp: 50 each, Rfl: 2   repaglinide (PRANDIN) 1 MG tablet, Take 1 tablet (1 mg total) by mouth 3 (three) times daily before meals., Disp: 90 tablet, Rfl: 5   vitamin B-12 (CYANOCOBALAMIN) 1000 MCG tablet, Take 1,000 mcg by mouth 2 (two) times daily. , Disp: , Rfl:    Vitamin D, Cholecalciferol, 25 MCG (1000 UT) CAPS, Take 1 capsule by mouth daily., Disp: , Rfl:    losartan (COZAAR) 50 MG tablet, Take 1 tablet (50 mg total) by mouth daily., Disp: 90 tablet, Rfl: 1   Review of  Systems:   ROS  Negative unless otherwise specified per HPI.  Vitals:   Vitals:   03/08/20 1034  BP: (!) 144/86  Pulse: (!) 105  Temp: 98 F (36.7 C)  SpO2: 95%  Weight: 245 lb (111.1 kg)  Height: 5' 6"  (1.676 m)     Body mass index is 39.54 kg/m.  Physical Exam:   Physical Exam Vitals and nursing note reviewed.  Constitutional:      General: She is not in acute distress.    Appearance: She is well-developed. She is not ill-appearing or toxic-appearing.  Cardiovascular:     Rate and Rhythm: Normal rate and regular rhythm.     Pulses: Normal pulses.     Heart sounds: Normal heart sounds, S1 normal and S2 normal.     Comments: No LE edema Pulmonary:     Effort: Pulmonary effort is normal.     Breath sounds: Normal breath sounds.  Feet:     Comments: R fourth toe without obvious redness/swelling; slightly hyperflexion  of PIP joint Skin:    General: Skin is warm and dry.  Neurological:     Mental Status: She is alert.     GCS: GCS eye subscore is 4. GCS verbal subscore is 5. GCS motor subscore is 6.  Psychiatric:        Speech: Speech normal.        Behavior: Behavior normal. Behavior is cooperative.         Assessment and Plan:   [name] was seen today for hypertension.  Diagnoses and all orders for this visit:  Essential hypertension Remains above goal today.  Will increase her losartan to 50 mg daily.  I asked her to follow-up with [place] in 3 months, sooner if concerns.  She is currently asymptomatic.  We will also update her BMP today. -     Basic metabolic panel  Complaint of pain of toe Will refer to podiatry, question possible hammertoe. -     Ambulatory referral to Podiatry  Other orders -     losartan (COZAAR) 50 MG tablet; Take 1 tablet (50 mg total) by mouth daily.    Reviewed expectations re: course of current medical issues. Discussed self-management of symptoms. Outlined signs and symptoms indicating need for more acute intervention. Patient verbalized understanding and all questions were answered. See orders for this visit as documented in the electronic medical record. Patient received an After-Visit Summary.    Inda Coke, PA-C

## 2020-03-12 ENCOUNTER — Other Ambulatory Visit: Payer: Self-pay | Admitting: Internal Medicine

## 2020-03-12 MED ORDER — ALPRAZOLAM 0.25 MG PO TABS: ORAL_TABLET | ORAL | 0 refills | Status: DC

## 2020-03-29 ENCOUNTER — Ambulatory Visit (INDEPENDENT_AMBULATORY_CARE_PROVIDER_SITE_OTHER): Payer: Medicare PPO

## 2020-03-29 ENCOUNTER — Other Ambulatory Visit: Payer: Self-pay

## 2020-03-29 ENCOUNTER — Ambulatory Visit: Payer: Medicare PPO | Admitting: Endocrinology

## 2020-03-29 ENCOUNTER — Encounter: Payer: Self-pay | Admitting: Endocrinology

## 2020-03-29 ENCOUNTER — Ambulatory Visit: Payer: Medicare PPO | Admitting: Podiatry

## 2020-03-29 VITALS — BP 112/78 | HR 107 | Ht 66.0 in | Wt 240.6 lb

## 2020-03-29 DIAGNOSIS — M79671 Pain in right foot: Secondary | ICD-10-CM

## 2020-03-29 DIAGNOSIS — M2041 Other hammer toe(s) (acquired), right foot: Secondary | ICD-10-CM

## 2020-03-29 DIAGNOSIS — E119 Type 2 diabetes mellitus without complications: Secondary | ICD-10-CM | POA: Diagnosis not present

## 2020-03-29 LAB — POCT GLYCOSYLATED HEMOGLOBIN (HGB A1C): Hemoglobin A1C: 7.7 % — AB (ref 4.0–5.6)

## 2020-03-29 MED ORDER — REPAGLINIDE 2 MG PO TABS: 2.0000 mg | ORAL_TABLET | Freq: Two times a day (BID) | ORAL | 3 refills | Status: DC

## 2020-03-29 NOTE — Patient Instructions (Addendum)
check your blood sugar twice a day.  vary the time of day when you check, between before the 3 meals, and at bedtime.  also check if you have symptoms of your blood sugar being too high or too low.  please keep a record of the readings and bring it to your next appointment here (or you can bring the meter itself).  You can write it on any piece of paper.  please call us sooner if your blood sugar goes below 70, or if you have a lot of readings over 200. I have sent a prescription to your pharmacy, to increase the repaglinide.   Please come back for a follow-up appointment in 3 months.

## 2020-03-29 NOTE — Progress Notes (Signed)
Subjective:    Patient ID: Marie Rhodes, female    DOB: 23-May-1936, 84 y.o.   MRN: [id number]  HPI Pt returns for f/u of diabetes mellitus:  DM type: 2 Dx'ed: 5643 Complications: stage 3a CRI  Therapy: repaglinide. GDM: never DKA: never Severe hypoglycemia: never Pancreatitis: never Other: she has never taken insulin; Crohn's is a relative contraindication to GLP meds.  Interval history: pt states she feels well in general.  She takes meds as rx'ed.  She says cbg's are in the 100's.   Past Medical History:  Diagnosis Date  . ANXIETY 04/27/2007  . CROHN'S DISEASE, SMALL INTESTINE 06/08/2008   ileum, cecum  . DEPRESSION 04/27/2007  . Fatty tumor    left shoulder  . GOITER, MULTINODULAR 05/04/2008  . Hyperlipemia   . Molar pregnancy 1967  . OBESITY 08/01/2010  . UNSPECIFIED ANEMIA 05/04/2008   Mild Anemia  . Vitamin D deficiency 04/08/2018    Past Surgical History:  Procedure Laterality Date  . APPENDECTOMY    . BIOPSY THYROID    . COLONOSCOPY  08/10/2006   normal  . LEFT OOPHORECTOMY  1992  . MECKEL DIVERTICULUM EXCISION     Ileal and cecal resection     Social History   Socioeconomic History  . Marital status: Widowed    Spouse name: Not on file  . Number of children: Not on file  . Years of education: Not on file  . Highest education level: Not on file  Occupational History  . 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 Smoking status: Former Smoker    Quit date: 05/29/1988    Years since quitting: 31.8  . Smokeless tobacco: Never Used  Substance and Sexual Activity  . Alcohol use: No  . Drug use: No  . Sexual activity: Not on file  Other Topics Concern  . Not on file  Social History Narrative   Patient is widowed --husband died from brain aneurysm, she is a retired Pharmacist, hospital (second grade) and currently works at an [employer] 2 days a week.   Former Smoker (college only), no alcohol or drug use   Both of her  grandchildren lived with her while they went to college    As of November 2019, she lives alone   Social Determinants of Radio broadcast assistant Strain:   . Difficulty of Paying Living Expenses:   Food Insecurity:   . Worried About Charity fundraiser in the Last Year:   . Arboriculturist in the Last Year:   Transportation Needs:   . Film/video editor (Medical):   Marland Kitchen Lack of Transportation (Non-Medical):   Physical Activity:   . Days of Exercise per Week:   . Minutes of Exercise per Session:   Stress:   . Feeling of Stress :   Social Connections:   . Frequency of Communication with Friends and Family:   . Frequency of Social Gatherings with Friends and Family:   . Attends Religious Services:   . Active Member of Clubs or Organizations:   . Attends Archivist Meetings:   Marland Kitchen Marital Status:   Intimate Partner Violence:   . Fear of Current or Ex-Partner:   . Emotionally Abused:   Marland Kitchen Physically Abused:   . Sexually Abused:     Current Outpatient Medications on File Prior to Visit  Medication Sig Dispense Refill  . ALPRAZolam (XANAX) 0.25 MG tablet TAKE AS NEEDED  FOR ANXIETY. 30 tablet 0  . ferrous sulfate 325 (65 FE) MG tablet Take 1 tablet (325 mg total) by mouth 2 (two) times daily.  3  . losartan (COZAAR) 50 MG tablet Take 1 tablet (50 mg total) by mouth daily. 90 tablet 1  . Multiple Vitamin (MULTIVITAMIN) tablet Take 1 tablet by mouth at bedtime.     . ONE TOUCH ULTRA TEST test strip CHECK BLOOD SUGAR ONCE A DAY. 50 each 2  . vitamin B-12 (CYANOCOBALAMIN) 1000 MCG tablet Take 1,000 mcg by mouth 2 (two) times daily.     . Vitamin D, Cholecalciferol, 25 MCG (1000 UT) CAPS Take 1 capsule by mouth daily.     No current facility-administered medications on file prior to visit.    No Known Allergies  Family History  Problem Relation Age of Onset  . Breast cancer Mother 14  . Kidney disease Mother        Dialysis  . Diabetes Mother   . Kidney cancer  Mother        Renal Cell Carcinoma  . Skin cancer Father        mets  . Diabetes Father   . Melanoma Sister   . Diabetes Brother   . Tongue cancer Brother 44  . Stroke Maternal Grandmother   . Heart disease Paternal Grandmother   . Colon cancer Paternal Uncle     BP 112/78   Pulse (!) 107   Ht 5' 6"  (1.676 m)   Wt 240 lb 9.6 oz (109.1 kg)   SpO2 95%   BMI 38.83 kg/m    Review of Systems She denies hypoglycemia.      Objective:   Physical Exam VITAL SIGNS:  See vs page GENERAL: no distress Pulses: dorsalis pedis intact bilat.   MSK: no deformity of the feet CV: trace bilat leg edema Skin:  no ulcer on the feet.  normal color and temp on the feet.  Neuro: sensation is intact to touch on the feet.    Lab Results  Component Value Date   HGBA1C 7.7 (A) 03/29/2020   Lab Results  Component Value Date   CREATININE 1.10 03/08/2020   BUN 24 (H) 03/08/2020   NA 136 03/08/2020   K 4.3 03/08/2020   CL 102 03/08/2020   CO2 27 03/08/2020       Assessment & Plan:  Type 2 DM, with stage 3a CRI: Worse.  We discussed adding [place].  She declines this.    Patient Instructions  check your blood sugar twice a day.  vary the time of day when you check, between before the 3 meals, and at bedtime.  also check if you have symptoms of your blood sugar being too high or too low.  please keep a record of the readings and bring it to your next appointment here (or you can bring the meter itself).  You can write it on any piece of paper.  please call us sooner if your blood sugar goes below 70, or if you have a lot of readings over 200. I have sent a prescription to your pharmacy, to increase the repaglinide.   Please come back for a follow-up appointment in 3 months.

## 2020-04-02 ENCOUNTER — Encounter: Payer: Self-pay | Admitting: Podiatry

## 2020-04-02 NOTE — Progress Notes (Signed)
Subjective:  Patient ID: Marie Rhodes, female    DOB: [birth year],  MRN: [id number]  Chief Complaint  Patient presents with   Foot Pain    pt is here for foot pain.    84 y.o. female presents with the above complaint.  Patient presents with a complaint of right fourth digit hammertoe contractures has been going for about 4 weeks has progressive gotten worse.  Patient states that there is no pain per se however it is very discomfort when she is walking.  Pain often varies.  Patient has tried good sneakers and modification but has not helped.  Patient has a history of gout.  She states that the pain in this toe sometimes feels similar to it.  She denies any other acute complaints.  She would like to discuss surgical options to treat the hammertoe as well as conservative options.  She denies any other acute complaints.   Review of Systems: Negative except as noted in the HPI. Denies N/V/F/Ch.  Past Medical History:  Diagnosis Date   ANXIETY 04/27/2007   CROHN'S DISEASE, SMALL INTESTINE 06/08/2008   ileum, cecum   DEPRESSION 04/27/2007   Fatty tumor    left shoulder   GOITER, MULTINODULAR 05/04/2008   Hyperlipemia    Molar pregnancy 1967   OBESITY 08/01/2010   UNSPECIFIED ANEMIA 05/04/2008   Mild Anemia   Vitamin D deficiency 04/08/2018    Current Outpatient Medications:    ALPRAZolam (XANAX) 0.25 MG tablet, TAKE AS NEEDED FOR ANXIETY., Disp: 30 tablet, Rfl: 0   ferrous sulfate 325 (65 FE) MG tablet, Take 1 tablet (325 mg total) by mouth 2 (two) times daily., Disp: , Rfl: 3   losartan (COZAAR) 50 MG tablet, Take 1 tablet (50 mg total) by mouth daily., Disp: 90 tablet, Rfl: 1   Multiple Vitamin (MULTIVITAMIN) tablet, Take 1 tablet by mouth at bedtime. , Disp: , Rfl:    ONE TOUCH ULTRA TEST test strip, CHECK BLOOD SUGAR ONCE A DAY., Disp: 50 each, Rfl: 2   repaglinide (PRANDIN) 2 MG tablet, Take 1 tablet (2 mg total) by mouth 2 (two) times daily before a meal., Disp: 180  tablet, Rfl: 3   vitamin B-12 (CYANOCOBALAMIN) 1000 MCG tablet, Take 1,000 mcg by mouth 2 (two) times daily. , Disp: , Rfl:    Vitamin D, Cholecalciferol, 25 MCG (1000 UT) CAPS, Take 1 capsule by mouth daily., Disp: , Rfl:   Social History   Tobacco Use  Smoking Status Former Smoker   Quit date: 05/29/1988   Years since quitting: 31.8  Smokeless Tobacco Never Used    No Known Allergies Objective:  There were no vitals filed for this visit. There is no height or weight on file to calculate BMI. Constitutional Well developed. Well nourished.  Vascular Dorsalis pedis pulses palpable bilaterally. Posterior tibial pulses palpable bilaterally. Capillary refill normal to all digits.  No cyanosis or clubbing noted. Pedal hair growth normal.  Neurologic Normal speech. Oriented to person, place, and time. Epicritic sensation to light touch grossly present bilaterally.  Dermatologic Nails well groomed and normal in appearance. No open wounds. No skin lesions.  Orthopedic:  Right fourth digit hammertoe contracture semiflexible in nature.  No pain on palpation to the digit.  The deformity includes dorsiflexion of the proximal phalanx and plantar flexion of the middle and distal phalanx.   Radiographs: 3 views of skeletally mature adult right foot: No osteoarthritic changes noted or any abnormalities noted.  Mild osteoarthritic change noted at the  first metatarsophalangeal joint.  Hammertoe contracture of the right fourth digit noted. Assessment:   1. Foot pain, right   2. Hammertoe of right foot    Plan:  Patient was evaluated and treated and all questions answered.  Right fourth digit hammertoe contracture none pain -Full I explained to the patient the etiology of hammertoe contractures with this relationship with muscular tendon imbalances and various treatment options were discussed.  Given that patient's pain is very mild in nature.  I mostly discussed shoe gear modification. -Toe  protectors were also dispensed to take the offloading the hammertoe contractures -At this time we will hold off on any surgical intervention and if the pain worsens will consider surgical intervention at that time.  No follow-ups on file.

## 2020-04-04 ENCOUNTER — Other Ambulatory Visit: Payer: Self-pay | Admitting: Podiatry

## 2020-04-04 DIAGNOSIS — M2041 Other hammer toe(s) (acquired), right foot: Secondary | ICD-10-CM

## 2020-05-24 DIAGNOSIS — E1136 Type 2 diabetes mellitus with diabetic cataract: Secondary | ICD-10-CM | POA: Diagnosis not present

## 2020-05-24 DIAGNOSIS — H5213 Myopia, bilateral: Secondary | ICD-10-CM | POA: Diagnosis not present

## 2020-05-24 DIAGNOSIS — H25813 Combined forms of age-related cataract, bilateral: Secondary | ICD-10-CM | POA: Diagnosis not present

## 2020-05-24 DIAGNOSIS — H524 Presbyopia: Secondary | ICD-10-CM | POA: Diagnosis not present

## 2020-05-24 DIAGNOSIS — H52203 Unspecified astigmatism, bilateral: Secondary | ICD-10-CM | POA: Diagnosis not present

## 2020-05-24 LAB — HM DIABETES EYE EXAM

## 2020-05-31 DIAGNOSIS — Z1231 Encounter for screening mammogram for malignant neoplasm of breast: Secondary | ICD-10-CM | POA: Diagnosis not present

## 2020-06-17 ENCOUNTER — Encounter: Payer: Self-pay | Admitting: Physician Assistant

## 2020-06-17 ENCOUNTER — Other Ambulatory Visit: Payer: Self-pay

## 2020-06-17 ENCOUNTER — Ambulatory Visit: Payer: Medicare PPO | Admitting: Physician Assistant

## 2020-06-17 VITALS — BP 128/80 | HR 105 | Temp 98.0°F | Ht 66.0 in | Wt 241.2 lb

## 2020-06-17 DIAGNOSIS — Z23 Encounter for immunization: Secondary | ICD-10-CM | POA: Diagnosis not present

## 2020-06-17 DIAGNOSIS — I10 Essential (primary) hypertension: Secondary | ICD-10-CM

## 2020-06-17 NOTE — Progress Notes (Signed)
Marie Rhodes is a 84 y.o. female is here for follow up.  I acted as a Education administrator for [employer], PA-C Anselmo Pickler, LPN   History of Present Illness:   Chief Complaint  Patient presents with  . Hypertension    HPI   Hypertension Pt here for follow up, currently taking Losartan 50 mg daily. Pt denies headaches, dizziness, blurred vision, chest pain, SOB or lower leg edema. Denies excessive caffeine intake, stimulant usage, excessive alcohol intake or increase in salt consumption.  BP Readings from Last 3 Encounters:  06/17/20 128/80  03/29/20 112/78  03/08/20 (!) 144/86      Health Maintenance Due  Topic Date Due  . INFLUENZA VACCINE  04/28/2020    Past Medical History:  Diagnosis Date  . ANXIETY 04/27/2007  . CROHN'S DISEASE, SMALL INTESTINE 06/08/2008   ileum, cecum  . DEPRESSION 04/27/2007  . Fatty tumor    left shoulder  . GOITER, MULTINODULAR 05/04/2008  . Hyperlipemia   . Molar pregnancy 1967  . OBESITY 08/01/2010  . UNSPECIFIED ANEMIA 05/04/2008   Mild Anemia  . Vitamin D deficiency 04/08/2018     Social History   Tobacco Use  . Smoking status: Former Smoker    Quit date: 05/29/1988    Years since quitting: 32.0  . Smokeless tobacco: Never Used  Substance Use Topics  . Alcohol use: No  . Drug use: No    Past Surgical History:  Procedure Laterality Date  . APPENDECTOMY    . BIOPSY THYROID    . COLONOSCOPY  08/10/2006   normal  . LEFT OOPHORECTOMY  1992  . MECKEL DIVERTICULUM EXCISION     Ileal and cecal resection     Family History  Problem Relation Age of Onset  . Breast cancer Mother 25  . Kidney disease Mother        Dialysis  . Diabetes Mother   . Kidney cancer Mother        Renal Cell Carcinoma  . Skin cancer Father        mets  . Diabetes Father   . Melanoma Sister   . Diabetes Brother   . Tongue cancer Brother 58  . Stroke Maternal Grandmother   . Heart disease Paternal Grandmother   . Colon cancer Paternal Uncle      PMHx, SurgHx, SocialHx, FamHx, Medications, and Allergies were reviewed in the Visit Navigator and updated as appropriate.   Patient Active Problem List   Diagnosis Date Noted  . Vitamin D deficiency 04/08/2018  . Iron deficiency 02/17/2018  . B12 deficiency 02/17/2018  . Shoulder swelling 10/08/2017  . Hypercalcemia 02/08/2017  . Arthralgia 01/22/2015  . Diabetes ([place]) 03/05/2014  . Abnormal ECG 09/12/2012  . Retinal detachment 09/12/2012  . Cough 06/28/2012  . Menopause 09/10/2011  . Renal insufficiency 12/22/2010  . OBESITY 08/01/2010  . HYPERCHOLESTEROLEMIA 06/24/2010  . NASH (nonalcoholic steatohepatitis) 06/23/2009  . CROHN'S DISEASE, SMALL INTESTINE 06/08/2008  . GOITER, MULTINODULAR 05/04/2008  . GERD 01/19/2008  . ANXIETY 04/27/2007  . DEPRESSION 04/27/2007  . Essential hypertension 04/27/2007    Social History   Tobacco Use  . Smoking status: Former Smoker    Quit date: 05/29/1988    Years since quitting: 32.0  . Smokeless tobacco: Never Used  Substance Use Topics  . Alcohol use: No  . Drug use: No    Current Medications and Allergies:    Current Outpatient Medications:  .  ALPRAZolam (XANAX) 0.25 MG tablet, TAKE AS NEEDED  FOR ANXIETY., Disp: 30 tablet, Rfl: 0 .  ferrous sulfate 325 (65 FE) MG tablet, Take 1 tablet (325 mg total) by mouth 2 (two) times daily., Disp: , Rfl: 3 .  losartan (COZAAR) 50 MG tablet, Take 1 tablet (50 mg total) by mouth daily., Disp: 90 tablet, Rfl: 1 .  Multiple Vitamin (MULTIVITAMIN) tablet, Take 1 tablet by mouth at bedtime. , Disp: , Rfl:  .  ONE TOUCH ULTRA TEST test strip, CHECK BLOOD SUGAR ONCE A DAY., Disp: 50 each, Rfl: 2 .  repaglinide (PRANDIN) 2 MG tablet, Take 1 tablet (2 mg total) by mouth 2 (two) times daily before a meal., Disp: 180 tablet, Rfl: 3 .  vitamin B-12 (CYANOCOBALAMIN) 1000 MCG tablet, Take 1,000 mcg by mouth 2 (two) times daily. , Disp: , Rfl:  .  Vitamin D, Cholecalciferol, 25 MCG (1000 UT) CAPS,  Take 1 capsule by mouth daily., Disp: , Rfl:   No Known Allergies  Review of Systems   ROS  Negative unless otherwise specified per HPI.  Vitals:   Vitals:   06/17/20 1505  BP: 128/80  Pulse: (!) 105  Temp: 98 F (36.7 C)  TempSrc: Temporal  SpO2: 96%  Weight: 241 lb 4 oz (109.4 kg)  Height: 5' 6"  (1.676 m)     Body mass index is 38.94 kg/m.   Physical Exam:    Physical Exam Vitals and nursing note reviewed.  Constitutional:      General: She is not in acute distress.    Appearance: She is well-developed. She is not ill-appearing or toxic-appearing.  Cardiovascular:     Rate and Rhythm: Normal rate and regular rhythm.     Pulses: Normal pulses.     Heart sounds: Normal heart sounds, S1 normal and S2 normal.     Comments: No LE edema Pulmonary:     Effort: Pulmonary effort is normal.     Breath sounds: Normal breath sounds.  Skin:    General: Skin is warm and dry.  Neurological:     Mental Status: She is alert.     GCS: GCS eye subscore is 4. GCS verbal subscore is 5. GCS motor subscore is 6.  Psychiatric:        Speech: Speech normal.        Behavior: Behavior normal. Behavior is cooperative.      Assessment and Plan:    [name] was seen today for hypertension.  Diagnoses and all orders for this visit:  Essential hypertension   Currently well controlled. Continue current regimen of Losartan 50 mg daily. Follow-up in 3 months to repeat labs, including cholesterol and renal function panel, sooner if concerns.  CMA or LPN served as scribe during this visit. History, Physical, and Plan performed by medical provider. The above documentation has been reviewed and is accurate and complete.   Inda Coke, PA-C , [place] [facility] 06/17/2020  Follow-up: No follow-ups on file.

## 2020-06-17 NOTE — Patient Instructions (Signed)
It was great to see you!  You are doing well! Continue current regimen, no changes.  Let's follow-up in months, sooner if you have concerns.  Take care,  Inda Coke PA-C

## 2020-07-01 ENCOUNTER — Encounter: Payer: Self-pay | Admitting: Physician Assistant

## 2020-07-01 ENCOUNTER — Ambulatory Visit (INDEPENDENT_AMBULATORY_CARE_PROVIDER_SITE_OTHER): Payer: Medicare PPO | Admitting: Physician Assistant

## 2020-07-01 ENCOUNTER — Other Ambulatory Visit: Payer: Self-pay

## 2020-07-01 VITALS — BP 130/80 | HR 102 | Temp 98.2°F | Ht 66.0 in | Wt 241.5 lb

## 2020-07-01 DIAGNOSIS — M5432 Sciatica, left side: Secondary | ICD-10-CM | POA: Diagnosis not present

## 2020-07-01 MED ORDER — ALPRAZOLAM 0.25 MG PO TABS: ORAL_TABLET | ORAL | 0 refills | Status: DC

## 2020-07-01 MED ORDER — ACETAMINOPHEN-CODEINE #3 300-30 MG PO TABS: 1.0000 | ORAL_TABLET | ORAL | 0 refills | Status: DC | PRN

## 2020-07-01 NOTE — Patient Instructions (Signed)
It was great to see you!  Trial the stretches, as tolerated.  Use pain medication for severe pain. If pain returns, get the xray -- see below.  An order for an xray has been put in for you. To get your xray, you can walk in at the [facility] location without a scheduled appointment.  The address is [address]. It is across the street from [place] is located in the basement.  Hours of operation are M-F 8:30am to 5:00pm. Please note that they are closed for lunch between 12:30 and 1:00pm.   Take care,  Inda Coke PA-C

## 2020-07-01 NOTE — Progress Notes (Signed)
Marie Rhodes is a 84 y.o. female here for a new problem.  I acted as a Education administrator for [employer], PA-C [employer], LPN   History of Present Illness:   Chief Complaint  Patient presents with  . sciatica nerve pain    HPI   Sciatica pain Woke up one morning and had severe pain in the middle of her L buttocks. This was 4-5 weeks ago. Initially used Tylenol, lidocaine patches and aspercreme.  She endorsed relief of symptoms.    Pain came back 4-5 days ago, been taking Tylenol and pain patch with little relief.  With time, her symptoms eventually resolved.  She is in no pain today and was thinking about canceling her appointment today.  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 Stool is cushioned.   Denies: bowel/bladder incontinence, saddle anesthesia, numbness/tingling, fever, chills, urinary pain  Anxiety Overall well controlled.  Takes Xanax as needed.  Needs a refill today.  She does not abuse this medication.  Past Medical History:  Diagnosis Date  . ANXIETY 04/27/2007  . CROHN'S DISEASE, SMALL INTESTINE 06/08/2008   ileum, cecum  . DEPRESSION 04/27/2007  . Fatty tumor    left shoulder  . GOITER, MULTINODULAR 05/04/2008  . Hyperlipemia   . Molar pregnancy 1967  . OBESITY 08/01/2010  . UNSPECIFIED ANEMIA 05/04/2008   Mild Anemia  . Vitamin D deficiency 04/08/2018     Social History   Tobacco Use  . Smoking status: Former Smoker    Quit date: 05/29/1988    Years since quitting: 32.1  . Smokeless tobacco: Never Used  Substance Use Topics  . Alcohol use: No  . Drug use: No    Past Surgical History:  Procedure Laterality Date  . APPENDECTOMY    . BIOPSY THYROID    . COLONOSCOPY  08/10/2006   normal  . LEFT OOPHORECTOMY  1992  . MECKEL DIVERTICULUM EXCISION     Ileal and cecal resection     Family History  Problem Relation Age of Onset  . Breast cancer Mother 49  . Kidney disease Mother        Dialysis  .  Diabetes Mother   . Kidney cancer Mother        Renal Cell Carcinoma  . Skin cancer Father        mets  . Diabetes Father   . Melanoma Sister   . Diabetes Brother   . Tongue cancer Brother 26  . Stroke Maternal Grandmother   . Heart disease Paternal Grandmother   . Colon cancer Paternal Uncle     No Known Allergies  Current Medications:   Current Outpatient Medications:  .  ALPRAZolam (XANAX) 0.25 MG tablet, TAKE AS NEEDED FOR ANXIETY., Disp: 30 tablet, Rfl: 0 .  ferrous sulfate 325 (65 FE) MG tablet, Take 1 tablet (325 mg total) by mouth 2 (two) times daily., Disp: , Rfl: 3 .  losartan (COZAAR) 50 MG tablet, Take 1 tablet (50 mg total) by mouth daily., Disp: 90 tablet, Rfl: 1 .  Multiple Vitamin (MULTIVITAMIN) tablet, Take 1 tablet by mouth at bedtime. , Disp: , Rfl:  .  ONE TOUCH ULTRA TEST test strip, CHECK BLOOD SUGAR ONCE A DAY., Disp: 50 each, Rfl: 2 .  repaglinide (PRANDIN) 2 MG tablet, Take 1 tablet (2 mg total) by mouth 2 (two) times daily before a meal., Disp: 180 tablet, Rfl: 3 .  vitamin B-12 (CYANOCOBALAMIN)  1000 MCG tablet, Take 1,000 mcg by mouth 2 (two) times daily. , Disp: , Rfl:  .  Vitamin D, Cholecalciferol, 25 MCG (1000 UT) CAPS, Take 1 capsule by mouth daily., Disp: , Rfl:  .  acetaminophen-codeine (TYLENOL #3) 300-30 MG tablet, Take 1-2 tablets by mouth every 4 (four) hours as needed for moderate pain., Disp: 30 tablet, Rfl: 0   Review of Systems:   ROS Negative unless otherwise specified per HPI.  Vitals:   Vitals:   07/01/20 1111  BP: 130/80  Pulse: (!) 102  Temp: 98.2 F (36.8 C)  TempSrc: Temporal  SpO2: 96%  Weight: 241 lb 8 oz (109.5 kg)  Height: 5' 6"  (1.676 m)     Body mass index is 38.98 kg/m.  Physical Exam:   Physical Exam Vitals and nursing note reviewed.  Constitutional:      General: She is not in acute distress.    Appearance: She is well-developed. She is not ill-appearing or toxic-appearing.  Cardiovascular:     Rate  and Rhythm: Normal rate and regular rhythm.     Pulses: Normal pulses.     Heart sounds: Normal heart sounds, S1 normal and S2 normal.     Comments: No LE edema Pulmonary:     Effort: Pulmonary effort is normal.     Breath sounds: Normal breath sounds.  Musculoskeletal:     Comments: No decreased ROM 2/2 pain with flexion/extension, lateral side bends, or rotation. No reproducible tenderness with deep palpation to bilateral paraspinal muscles. No bony tenderness. No evidence of erythema, rash or ecchymosis.    Skin:    General: Skin is warm and dry.  Neurological:     Mental Status: She is alert.     GCS: GCS eye subscore is 4. GCS verbal subscore is 5. GCS motor subscore is 6.  Psychiatric:        Speech: Speech normal.        Behavior: Behavior normal. Behavior is cooperative.       Assessment and Plan:   Marie Rhodes was seen today for sciatica nerve pain.  Diagnoses and all orders for this visit:  Left sided sciatica No red flags on exam. She is essentially asymptomatic today. We will trial gentle exercises, I have provided her a handout on these. I recommended that should her symptoms return that we update an x-ray.  An order has been placed for this for [facility] office. Additionally should she have another episode of acute pain she may trial Tylenol #3, I have sent this in for her.  She cannot tolerate oral anti-inflammatories due to her history of GI surgeries per her gastroenterologist.  Additionally she has diabetes so would like to limit her prednisone use as well. Should any symptoms change or worsen I asked her to let us know. -     DG Lumbar Spine Complete; Future  Other orders -     ALPRAZolam (XANAX) 0.25 MG tablet; TAKE AS NEEDED FOR ANXIETY. -     acetaminophen-codeine (TYLENOL #3) 300-30 MG tablet; Take 1-2 tablets by mouth every 4 (four) hours as needed for moderate pain.  CMA or LPN served as scribe during this visit. History, Physical, and Plan performed by  medical provider. The above documentation has been reviewed and is accurate and complete.  Time spent with patient today was 25 minutes which consisted of chart review, discussing diagnosis, work up, treatment answering questions and documentation.  Inda Coke, PA-C

## 2020-07-05 ENCOUNTER — Ambulatory Visit: Payer: Medicare PPO | Admitting: Endocrinology

## 2020-07-15 ENCOUNTER — Encounter: Payer: Self-pay | Admitting: Endocrinology

## 2020-07-15 ENCOUNTER — Ambulatory Visit: Payer: Medicare PPO | Admitting: Endocrinology

## 2020-07-15 ENCOUNTER — Other Ambulatory Visit: Payer: Self-pay

## 2020-07-15 VITALS — BP 140/80 | HR 102 | Ht 66.0 in | Wt 243.0 lb

## 2020-07-15 DIAGNOSIS — E119 Type 2 diabetes mellitus without complications: Secondary | ICD-10-CM | POA: Diagnosis not present

## 2020-07-15 LAB — POCT GLYCOSYLATED HEMOGLOBIN (HGB A1C): Hemoglobin A1C: 8 % — AB (ref 4.0–5.6)

## 2020-07-15 LAB — TSH: TSH: 3.34 u[IU]/mL (ref 0.35–4.50)

## 2020-07-15 LAB — T4, FREE: Free T4: 0.69 ng/dL (ref 0.60–1.60)

## 2020-07-15 MED ORDER — REPAGLINIDE 1 MG PO TABS: 3.0000 mg | ORAL_TABLET | Freq: Two times a day (BID) | ORAL | 3 refills | Status: DC

## 2020-07-15 NOTE — Progress Notes (Signed)
Subjective:    Patient ID: BRIAR SWORD, female    DOB: 08-20-1936, 84 y.o.   MRN: [id number]  HPI Pt returns for f/u of diabetes mellitus:  DM type: 2 Dx'ed: 8676 Complications: stage 3a CRI  Therapy: repaglinide. GDM: never DKA: never Severe hypoglycemia: never Pancreatitis: never Other: she has never taken insulin; Crohn's is a relative contraindication to GLP meds.  Interval history: pt states she feels well in general.  She takes meds as rx'ed.  She says cbg's are in the 100's.   Past Medical History:  Diagnosis Date  . ANXIETY 04/27/2007  . CROHN'S DISEASE, SMALL INTESTINE 06/08/2008   ileum, cecum  . DEPRESSION 04/27/2007  . Fatty tumor    left shoulder  . GOITER, MULTINODULAR 05/04/2008  . Hyperlipemia   . Molar pregnancy 1967  . OBESITY 08/01/2010  . UNSPECIFIED ANEMIA 05/04/2008   Mild Anemia  . Vitamin D deficiency 04/08/2018    Past Surgical History:  Procedure Laterality Date  . APPENDECTOMY    . BIOPSY THYROID    . COLONOSCOPY  08/10/2006   normal  . LEFT OOPHORECTOMY  1992  . MECKEL DIVERTICULUM EXCISION     Ileal and cecal resection     Social History   Socioeconomic History  . Marital status: Widowed    Spouse name: Not on file  . Number of children: Not on file  . Years of education: Not on file  . Highest education level: Not on file  Occupational History  . 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 Smoking status: Former Smoker    Quit date: 05/29/1988    Years since quitting: 32.1  . Smokeless tobacco: Never Used  Substance and Sexual Activity  . Alcohol use: No  . Drug use: No  . Sexual activity: Not on file  Other Topics Concern  . Not on file  Social History Narrative   Patient is widowed --husband died from brain aneurysm, she is a retired Pharmacist, hospital (second grade) and currently works at an [employer] 2 days a week.   Former Smoker (college only), no alcohol or drug use   Both of her  grandchildren lived with her while they went to college    As of November 2019, she lives alone   Social Determinants of Radio broadcast assistant Strain:   . Difficulty of Paying Living Expenses: Not on file  Food Insecurity:   . Worried About Charity fundraiser in the Last Year: Not on file  . Ran Out of Food in the Last Year: Not on file  Transportation Needs:   . Lack of Transportation (Medical): Not on file  . Lack of Transportation (Non-Medical): Not on file  Physical Activity:   . Days of Exercise per Week: Not on file  . Minutes of Exercise per Session: Not on file  Stress:   . Feeling of Stress : Not on file  Social Connections:   . Frequency of Communication with Friends and Family: Not on file  . Frequency of Social Gatherings with Friends and Family: Not on file  . Attends Religious Services: Not on file  . Active Member of Clubs or Organizations: Not on file  . Attends Archivist Meetings: Not on file  . Marital Status: Not on file  Intimate Partner Violence:   . Fear of Current or Ex-Partner: Not on file  . Emotionally Abused: Not on file  . Physically Abused:  Not on file  . Sexually Abused: Not on file    Current Outpatient Medications on File Prior to Visit  Medication Sig Dispense Refill  . acetaminophen-codeine (TYLENOL #3) 300-30 MG tablet Take 1-2 tablets by mouth every 4 (four) hours as needed for moderate pain. 30 tablet 0  . ALPRAZolam (XANAX) 0.25 MG tablet TAKE AS NEEDED FOR ANXIETY. 30 tablet 0  . ferrous sulfate 325 (65 FE) MG tablet Take 1 tablet (325 mg total) by mouth 2 (two) times daily.  3  . losartan (COZAAR) 50 MG tablet Take 1 tablet (50 mg total) by mouth daily. 90 tablet 1  . Multiple Vitamin (MULTIVITAMIN) tablet Take 1 tablet by mouth at bedtime.     . ONE TOUCH ULTRA TEST test strip CHECK BLOOD SUGAR ONCE A DAY. 50 each 2  . vitamin B-12 (CYANOCOBALAMIN) 1000 MCG tablet Take 1,000 mcg by mouth 2 (two) times daily.     .  Vitamin D, Cholecalciferol, 25 MCG (1000 UT) CAPS Take 1 capsule by mouth daily.     No current facility-administered medications on file prior to visit.    No Known Allergies  Family History  Problem Relation Age of Onset  . Breast cancer Mother 20  . Kidney disease Mother        Dialysis  . Diabetes Mother   . Kidney cancer Mother        Renal Cell Carcinoma  . Skin cancer Father        mets  . Diabetes Father   . Melanoma Sister   . Diabetes Brother   . Tongue cancer Brother 20  . Stroke Maternal Grandmother   . Heart disease Paternal Grandmother   . Colon cancer Paternal Uncle     BP 140/80   Pulse (!) 102   Ht 5' 6"  (1.676 m)   Wt 243 lb (110.2 kg)   SpO2 96%   BMI 39.22 kg/m    Review of Systems She denies hypoglycemia.      Objective:   Physical Exam VITAL SIGNS:  See vs page GENERAL: no distress Pulses: dorsalis pedis intact bilat.   MSK: no deformity of the feet CV: no leg edema Skin:  no ulcer on the feet.  normal color and temp on the feet. Neuro: sensation is intact to touch on the feet.    A1C=8.0%      Assessment & Plan:  Type 2 DM, with stage 3 CRI: uncontrolled.  She declines to add another med. Tachycardia: check TFT  Patient Instructions  check your blood sugar twice a day.  vary the time of day when you check, between before the 3 meals, and at bedtime.  also check if you have symptoms of your blood sugar being too high or too low.  please keep a record of the readings and bring it to your next appointment here (or you can bring the meter itself).  You can write it on any piece of paper.  please call us sooner if your blood sugar goes below 70, or if you have a lot of readings over 200. I have sent a prescription to your pharmacy, to increase the repaglinide again. Blood tests are requested for you today.  We'll let you know about the results.  Please come back for a follow-up appointment in 3 months.

## 2020-07-15 NOTE — Patient Instructions (Addendum)
check your blood sugar twice a day.  vary the time of day when you check, between before the 3 meals, and at bedtime.  also check if you have symptoms of your blood sugar being too high or too low.  please keep a record of the readings and bring it to your next appointment here (or you can bring the meter itself).  You can write it on any piece of paper.  please call us sooner if your blood sugar goes below 70, or if you have a lot of readings over 200. I have sent a prescription to your pharmacy, to increase the repaglinide again. Blood tests are requested for you today.  We'll let you know about the results.  Please come back for a follow-up appointment in 3 months.

## 2020-08-27 ENCOUNTER — Other Ambulatory Visit: Payer: Self-pay

## 2020-08-27 ENCOUNTER — Ambulatory Visit (INDEPENDENT_AMBULATORY_CARE_PROVIDER_SITE_OTHER)
Admission: RE | Admit: 2020-08-27 | Discharge: 2020-08-27 | Disposition: A | Discharge status: Home / Self Care | Payer: Medicare PPO | Source: Ambulatory Visit | Attending: Physician Assistant | Admitting: Physician Assistant

## 2020-08-27 DIAGNOSIS — M5432 Sciatica, left side: Secondary | ICD-10-CM | POA: Diagnosis not present

## 2020-08-27 DIAGNOSIS — M545 Low back pain, unspecified: Secondary | ICD-10-CM | POA: Diagnosis not present

## 2020-08-29 ENCOUNTER — Other Ambulatory Visit: Payer: Self-pay

## 2020-08-29 DIAGNOSIS — M5432 Sciatica, left side: Secondary | ICD-10-CM

## 2020-09-16 ENCOUNTER — Other Ambulatory Visit: Payer: Self-pay | Admitting: Physician Assistant

## 2020-10-23 ENCOUNTER — Other Ambulatory Visit: Payer: Self-pay

## 2020-10-25 ENCOUNTER — Encounter: Payer: Self-pay | Admitting: Physician Assistant

## 2020-10-25 ENCOUNTER — Other Ambulatory Visit: Payer: Self-pay

## 2020-10-25 ENCOUNTER — Ambulatory Visit (INDEPENDENT_AMBULATORY_CARE_PROVIDER_SITE_OTHER): Payer: Medicare PPO | Admitting: Physician Assistant

## 2020-10-25 ENCOUNTER — Ambulatory Visit: Payer: Medicare PPO | Admitting: Endocrinology

## 2020-10-25 VITALS — BP 132/60 | HR 76 | Ht 66.5 in | Wt 239.0 lb

## 2020-10-25 VITALS — BP 146/88 | HR 79 | Temp 98.4°F | Ht 66.5 in | Wt 239.2 lb

## 2020-10-25 DIAGNOSIS — E2839 Other primary ovarian failure: Secondary | ICD-10-CM

## 2020-10-25 DIAGNOSIS — Z0000 Encounter for general adult medical examination without abnormal findings: Secondary | ICD-10-CM | POA: Diagnosis not present

## 2020-10-25 DIAGNOSIS — I10 Essential (primary) hypertension: Secondary | ICD-10-CM | POA: Diagnosis not present

## 2020-10-25 DIAGNOSIS — E119 Type 2 diabetes mellitus without complications: Secondary | ICD-10-CM

## 2020-10-25 DIAGNOSIS — E669 Obesity, unspecified: Secondary | ICD-10-CM

## 2020-10-25 DIAGNOSIS — N289 Disorder of kidney and ureter, unspecified: Secondary | ICD-10-CM

## 2020-10-25 LAB — POCT GLYCOSYLATED HEMOGLOBIN (HGB A1C): Hemoglobin A1C: 8.1 % — AB (ref 4.0–5.6)

## 2020-10-25 MED ORDER — ONETOUCH VERIO VI STRP: 1.0000 | ORAL_STRIP | 12 refills | Status: DC | PRN

## 2020-10-25 MED ORDER — DAPAGLIFLOZIN PROPANEDIOL 5 MG PO TABS: 5.0000 mg | ORAL_TABLET | Freq: Every day | ORAL | 3 refills | Status: DC

## 2020-10-25 MED ORDER — DAPAGLIFLOZIN PROPANEDIOL 10 MG PO TABS: ORAL_TABLET | ORAL | 0 refills | Status: DC

## 2020-10-25 NOTE — Patient Instructions (Addendum)
It was great to see you!  Keep a close eye on BP -- check about 3 times a week and record for [place]. Follow-up in 3 months, sooner if concerns.  Lets schedule your DEXA scan -- you can schedule this on your way out  Please go to the lab for blood work.   Our office will call you with your results unless you have chosen to receive results via MyChart.  If your blood work is normal we will follow-up each year for physicals and as scheduled for chronic medical problems.  If anything is abnormal we will treat accordingly and get you in for a follow-up.  Take care,  [facility] Maintenance, Female Adopting a healthy lifestyle and getting preventive care are important in promoting health and wellness. Ask your health care provider about:  The right schedule for you to have regular tests and exams.  Things you can do on your own to prevent diseases and keep yourself healthy. What should I know about diet, weight, and exercise? Eat a healthy diet  Eat a diet that includes plenty of vegetables, fruits, low-fat dairy products, and lean protein.  Do not eat a lot of foods that are high in solid fats, added sugars, or sodium.   Maintain a healthy weight Body mass index (BMI) is used to identify weight problems. It estimates body fat based on height and weight. Your health care provider can help determine your BMI and help you achieve or maintain a healthy weight. Get regular exercise Get regular exercise. This is one of the most important things you can do for your health. Most adults should:  Exercise for at least 150 minutes each week. The exercise should increase your heart rate and make you sweat (moderate-intensity exercise).  Do strengthening exercises at least twice a week. This is in addition to the moderate-intensity exercise.  Spend less time sitting. Even light physical activity can be beneficial. Watch cholesterol and blood lipids Have your blood tested for lipids and  cholesterol at 83 years of age, then have this test every 5 years. Have your cholesterol levels checked more often if:  Your lipid or cholesterol levels are high.  You are older than 83 years of age.  You are at high risk for heart disease. What should I know about cancer screening? Depending on your health history and family history, you may need to have cancer screening at various ages. This may include screening for:  Breast cancer.  Cervical cancer.  Colorectal cancer.  Skin cancer.  Lung cancer. What should I know about heart disease, diabetes, and high blood pressure? Blood pressure and heart disease  High blood pressure causes heart disease and increases the risk of stroke. This is more likely to develop in people who have high blood pressure readings, are of African descent, or are overweight.  Have your blood pressure checked: ? Every 3-5 years if you are 5-78 years of age. ? Every year if you are 20 years old or older. Diabetes Have regular diabetes screenings. This checks your fasting blood sugar level. Have the screening done:  Once every three years after age 27 if you are at a normal weight and have a low risk for diabetes.  More often and at a younger age if you are overweight or have a high risk for diabetes. What should I know about preventing infection? Hepatitis B If you have a higher risk for hepatitis B, you should be screened for this virus.  Talk with your health care provider to find out if you are at risk for hepatitis B infection. Hepatitis C Testing is recommended for:  Everyone born from [birth year] through 1963.  Anyone with known risk factors for hepatitis C. Sexually transmitted infections (STIs)  Get screened for STIs, including gonorrhea and chlamydia, if: ? You are sexually active and are younger than 83 years of age. ? You are older than 83 years of age and your health care provider tells you that you are at risk for this type of  infection. ? Your sexual activity has changed since you were last screened, and you are at increased risk for chlamydia or gonorrhea. Ask your health care provider if you are at risk.  Ask your health care provider about whether you are at high risk for HIV. Your health care provider may recommend a prescription medicine to help prevent HIV infection. If you choose to take medicine to prevent HIV, you should first get tested for HIV. You should then be tested every 3 months for as long as you are taking the medicine. Pregnancy  If you are about to stop having your period (premenopausal) and you may become pregnant, seek counseling before you get pregnant.  Take 400 to 800 micrograms (mcg) of folic acid every day if you become pregnant.  Ask for birth control (contraception) if you want to prevent pregnancy. Osteoporosis and menopause Osteoporosis is a disease in which the bones lose minerals and strength with aging. This can result in bone fractures. If you are 17 years old or older, or if you are at risk for osteoporosis and fractures, ask your health care provider if you should:  Be screened for bone loss.  Take a calcium or vitamin D supplement to lower your risk of fractures.  Be given hormone replacement therapy (HRT) to treat symptoms of menopause. Follow these instructions at home: Lifestyle  Do not use any products that contain nicotine or tobacco, such as cigarettes, e-cigarettes, and chewing tobacco. If you need help quitting, ask your health care provider.  Do not use street drugs.  Do not share needles.  Ask your health care provider for help if you need support or information about quitting drugs. Alcohol use  Do not drink alcohol if: ? Your health care provider tells you not to drink. ? You are pregnant, may be pregnant, or are planning to become pregnant.  If you drink alcohol: ? Limit how much you use to 0-1 drink a day. ? Limit intake if you are  breastfeeding.  Be aware of how much alcohol is in your drink. In the [place] one drink equals one 12 oz bottle of beer (355 mL), one 5 oz glass of wine (148 mL), or one 1 oz glass of hard liquor (44 mL). General instructions  Schedule regular health, dental, and eye exams.  Stay current with your vaccines.  Tell your health care provider if: ? You often feel depressed. ? You have ever been abused or do not feel safe at home. Summary  Adopting a healthy lifestyle and getting preventive care are important in promoting health and wellness.  Follow your health care provider's instructions about healthy diet, exercising, and getting tested or screened for diseases.  Follow your health care provider's instructions on monitoring your cholesterol and blood pressure. This information is not intended to replace advice given to you by your health care provider. Make sure you discuss any questions you have with your health care provider. Document Revised:  09/07/2018 Document Reviewed: 09/07/2018 Elsevier Patient Education  [place].

## 2020-10-25 NOTE — Addendum Note (Signed)
Addended by: Marian Sorrow on: 10/25/2020 04:52 PM   Modules accepted: Orders

## 2020-10-25 NOTE — Progress Notes (Signed)
I acted as a Education administrator for [employer], PA-C Anselmo Pickler, LPN    Subjective:    Marie Rhodes is a 85 y.o. female and is here for a comprehensive physical exam.   HPI  Health Maintenance Due  Topic Date Due  . MAMMOGRAM  Never done    Acute Concerns: None  Chronic Issues: HTN  -- Currently taking losartan 50 mg. At home blood pressure readings are: not regularly checked. Patient denies chest pain, SOB, blurred vision, dizziness, unusual headaches, lower leg swelling. Patient is compliant with medication. Denies excessive caffeine intake, stimulant usage, excessive alcohol intake, or increase in salt consumption.  BP Readings from Last 3 Encounters:  10/25/20 (!) 146/88  10/25/20 132/60  07/15/20 140/80   Diabetes -- 3  month follow-up -- seen by her endo today. Current DM meds: prandin 3 mg BID, and today was started on farxiga 5 mg. Blood sugars at home are: not regularly checked. Patient is  compliant with medications. Denies: hypoglycemic or hyperglycemic episodes or symptoms. This patient's diabetes is complicated by HTN and CKD.  Lab Results  Component Value Date   HGBA1C 8.1 (A) 10/25/2020   CKD -- due for updated labs. Avoids nephrotoxic agents. Denies nausea, vomiting, changes in urination.  Health Maintenance: Immunizations -- UTD, declines COVID Colonoscopy -- N/A Mammogram -- done 2021 PAP -- N/A Bone Density -- last done 09/2017 Diet -- overall well balanced Caffeine intake -- 1-2 cups daily Sleep habits -- sometimes wakes up a few times Exercise -- no regular walking right now due to weather Weight -- Weight: 239 lb 4 oz (108.5 kg)  Mood -- overall stable Weight history: Wt Readings from Last 10 Encounters:  10/25/20 239 lb 4 oz (108.5 kg)  10/25/20 239 lb (108.4 kg)  07/15/20 243 lb (110.2 kg)  07/01/20 241 lb 8 oz (109.5 kg)  06/17/20 241 lb 4 oz (109.4 kg)  03/29/20 240 lb 9.6 oz (109.1 kg)  03/08/20 245 lb (111.1 kg)  12/22/19 242 lb  (109.8 kg)  12/01/19 242 lb 8 oz (110 kg)  11/24/19 243 lb 8 oz (110.5 kg)   Body mass index is 38.04 kg/m. No LMP recorded. Patient is postmenopausal. Period characteristics: no vaginal bleeding Alcohol use: none Tobacco use: former  Not UTD with dentist UTD with eye doctor  Depression screen [facility] 2/9 10/25/2020  Decreased Interest 0  Down, Depressed, Hopeless 0  PHQ - 2 Score 0  Altered sleeping 0  Tired, decreased energy 1  Change in appetite 0  Feeling bad or failure about yourself  0  Trouble concentrating 0  Moving slowly or fidgety/restless 0  Suicidal thoughts -  PHQ-9 Score 1  Difficult doing work/chores Not difficult at all     Other providers/specialists: Patient Care Team: Inda Coke, [place] as PCP - General (Physician Assistant) Gatha Mayer, MD as Attending Physician (Gastroenterology) Lorelle Gibbs, MD (Radiology) Rolm Bookbinder, MD as Attending Physician (Dermatology) Renato Shin, MD as Consulting Physician (Endocrinology) Drake Leach, [place] as Consulting Physician (Optometry)    PMHx, SurgHx, SocialHx, Medications, and Allergies were reviewed in the Visit Navigator and updated as appropriate.   Past Medical History:  Diagnosis Date  . ANXIETY 04/27/2007  . CROHN'S DISEASE, SMALL INTESTINE 06/08/2008   ileum, cecum  . DEPRESSION 04/27/2007  . Fatty tumor    left shoulder  . GOITER, MULTINODULAR 05/04/2008  . Hyperlipemia   . Molar pregnancy 1967  . OBESITY 08/01/2010  . UNSPECIFIED ANEMIA 05/04/2008  Mild Anemia  . Vitamin D deficiency 04/08/2018     Past Surgical History:  Procedure Laterality Date  . APPENDECTOMY    . BIOPSY THYROID    . COLONOSCOPY  08/10/2006   normal  . LEFT OOPHORECTOMY  1992  . MECKEL DIVERTICULUM EXCISION     Ileal and cecal resection      Family History  Problem Relation Age of Onset  . Breast cancer Mother 41  . Kidney disease Mother        Dialysis  . Diabetes Mother   . Kidney cancer Mother         Renal Cell Carcinoma  . Skin cancer Father        mets  . Diabetes Father   . Melanoma Sister   . Diabetes Brother   . Tongue cancer Brother 70  . Stroke Maternal Grandmother   . Heart disease Paternal Grandmother   . Colon cancer Paternal Uncle     Social History   Tobacco Use  . Smoking status: Former Smoker    Quit date: 05/29/1988    Years since quitting: 32.4  . Smokeless tobacco: Never Used  Substance Use Topics  . Alcohol use: No  . Drug use: No    Review of Systems:   Review of Systems  Constitutional: Negative for chills, fever, malaise/fatigue and weight loss.  HENT: Negative for hearing loss, sinus pain and sore throat.   Respiratory: Negative for cough and hemoptysis.   Cardiovascular: Negative for chest pain, palpitations, leg swelling and PND.  Gastrointestinal: Negative for abdominal pain, constipation, diarrhea, heartburn, nausea and vomiting.  Genitourinary: Negative for dysuria, frequency and urgency.  Musculoskeletal: Negative for back pain, myalgias and neck pain.  Skin: Negative for itching and rash.  Neurological: Negative for dizziness, tingling, seizures and headaches.  Endo/Heme/Allergies: Negative for polydipsia.  Psychiatric/Behavioral: Negative for depression. The patient is not nervous/anxious.     Objective:   BP (!) 146/88 (BP Location: Left Arm, Patient Position: Sitting, Cuff Size: Large)   Pulse 79   Temp 98.4 F (36.9 C) (Temporal)   Ht 5' 6.5" (1.689 m)   Wt 239 lb 4 oz (108.5 kg)   SpO2 94%   BMI 38.04 kg/m  Body mass index is 38.04 kg/m.   General Appearance:    Alert, cooperative, no distress, appears stated age  Head:    Normocephalic, without obvious abnormality, atraumatic  Eyes:    PERRL, conjunctiva/corneas clear, EOM's intact, fundi    benign, both eyes  Ears:    Normal TM's and external ear canals, both ears  Nose:   Nares normal, septum midline, mucosa normal, no drainage    or sinus tenderness  Throat:    Lips, mucosa, and tongue normal; teeth and gums normal  Neck:   Supple, symmetrical, trachea midline, no adenopathy;    thyroid:  no enlargement/tenderness/nodules; no carotid   bruit or JVD  Back:     Symmetric, no curvature, ROM normal, no CVA tenderness  Lungs:     Clear to auscultation bilaterally, respirations unlabored  Chest Wall:    No tenderness or deformity   Heart:    Regular rate and rhythm, S1 and S2 normal, no murmur, rub or gallop  Breast Exam:    Deferred  Abdomen:     Soft, non-tender, bowel sounds active all four quadrants,    no masses, no organomegaly  Genitalia:    Deferred  Extremities:   Extremities normal, atraumatic, no cyanosis or edema  Pulses:   2+ and symmetric all extremities  Skin:   Skin color, texture, turgor normal, no rashes or lesions  Lymph nodes:   Cervical, supraclavicular, and axillary nodes normal  Neurologic:   CNII-XII intact, normal strength, sensation and reflexes    throughout    Assessment/Plan:   Katyana was seen today for annual exam.  Diagnoses and all orders for this visit:  Routine physical examination Today patient counseled on age appropriate routine health concerns for screening and prevention, each reviewed and up to date or declined. Immunizations reviewed and up to date or declined. Labs ordered and reviewed. Risk factors for depression reviewed and negative. Hearing function and visual acuity are intact. ADLs screened and addressed as needed. Functional ability and level of safety reviewed and appropriate. Education, counseling and referrals performed based on assessed risks today. Patient provided with a copy of personalized plan for preventive services.  Essential hypertension Slightly above goal in our office today but was normotensive at endocrinology this morning. Recommend monitor BP at home, keep record. Continue losartan 50 mg daily. Follow-up in 3 months, sooner if concerns. -     CBC with Differential/Platelet;  Future -     Comprehensive metabolic panel; Future -     Lipid panel; Future -     Lipid panel -     Comprehensive metabolic panel -     CBC with Differential/Platelet  Estrogen deficiency Last DEXA normal in 2019.  She would like this updated, order placed. -     DG Bone Density; Future  Type 2 diabetes mellitus without complication, without long-term current use of insulin ([place]) Management per endocrinologist. I did provide her with some farxiga samples for her to trial (she was prescribed this by endo this morning.)  Renal insufficiency Update kidney function today and provide recommendations based on results.  Obesity with serious comorbidity, unspecified classification, unspecified obesity type Work on diet and exercise as able. I did recommend that she consider taking a GLP-1, if her endocrinologist is agreeable to maximize weight loss -- she will consider this.    Well Adult Exam: Labs ordered: Yes. Patient counseling was done. See below for items discussed. Discussed the patient's BMI.  The BMI is not in the acceptable range; BMI management plan is completed Follow up in 3 months. Breast cancer screening: UTD. Cervical cancer screening: n/a   Patient Counseling: [x]    Nutrition: Stressed importance of moderation in sodium/caffeine intake, saturated fat and cholesterol, caloric balance, sufficient intake of fresh fruits, vegetables, fiber, calcium, iron, and 1 mg of folate supplement per day (for females capable of pregnancy).  [x]    Stressed the importance of regular exercise.   [x]    Substance Abuse: Discussed cessation/primary prevention of tobacco, alcohol, or other drug use; driving or other dangerous activities under the influence; availability of treatment for abuse.   [x]    Injury prevention: Discussed safety belts, safety helmets, smoke detector, smoking near bedding or upholstery.   [x]    Sexuality: Discussed sexually transmitted diseases, partner selection, use of  condoms, avoidance of unintended pregnancy  and contraceptive alternatives.  [x]    Dental health: Discussed importance of regular tooth brushing, flossing, and dental visits.  [x]    Health maintenance and immunizations reviewed. Please refer to Health maintenance section.   CMA or LPN served as scribe during this visit. History, Physical, and Plan performed by medical provider. The above documentation has been reviewed and is accurate and complete.   Inda Coke, PA-C [facility]  [place]

## 2020-10-25 NOTE — Progress Notes (Signed)
Subjective:    Patient ID: Marie Rhodes, female    DOB: 1935/12/04, 85 y.o.   MRN: [id number]  HPI Pt returns for f/u of diabetes mellitus:  DM type: 2 Dx'ed: 7416 Complications: stage 3a CRI  Therapy: repaglinide. GDM: never DKA: never Severe hypoglycemia: never Pancreatitis: never Other: she has never taken insulin; Crohn's is a relative contraindication to GLP meds.  Interval history: pt states she feels well in general.  She takes meds as rx'ed.  She says cbg's are in the 100's.  Past Medical History:  Diagnosis Date  . ANXIETY 04/27/2007  . CROHN'S DISEASE, SMALL INTESTINE 06/08/2008   ileum, cecum  . DEPRESSION 04/27/2007  . Fatty tumor    left shoulder  . GOITER, MULTINODULAR 05/04/2008  . Hyperlipemia   . Molar pregnancy 1967  . OBESITY 08/01/2010  . UNSPECIFIED ANEMIA 05/04/2008   Mild Anemia  . Vitamin D deficiency 04/08/2018    Past Surgical History:  Procedure Laterality Date  . APPENDECTOMY    . BIOPSY THYROID    . COLONOSCOPY  08/10/2006   normal  . LEFT OOPHORECTOMY  1992  . MECKEL DIVERTICULUM EXCISION     Ileal and cecal resection     Social History   Socioeconomic History  . Marital status: Widowed    Spouse name: Not on file  . Number of children: Not on file  . Years of education: Not on file  . Highest education level: Not on file  Occupational History  . 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 Smoking status: Former Smoker    Quit date: 05/29/1988    Years since quitting: 32.4  . Smokeless tobacco: Never Used  Substance and Sexual Activity  . Alcohol use: No  . Drug use: No  . Sexual activity: Not on file  Other Topics Concern  . Not on file  Social History Narrative   Patient is widowed --husband died from brain aneurysm, she is a retired Pharmacist, hospital (second grade) and currently works at an [employer] 2 days a week.   Former Research scientist (life sciences) (college only), no alcohol or drug use   Both of her  grandchildren lived with her while they went to college    As of November 2019, she lives alone   Social Determinants of Radio broadcast assistant Strain: Not on file  Food Insecurity: Not on file  Transportation Needs: Not on file  Physical Activity: Not on file  Stress: Not on file  Social Connections: Not on file  Intimate Partner Violence: Not on file    Current Outpatient Medications on File Prior to Visit  Medication Sig Dispense Refill  . ALPRAZolam (XANAX) 0.25 MG tablet TAKE AS NEEDED FOR ANXIETY. 30 tablet 0  . ferrous sulfate 325 (65 FE) MG tablet Take 1 tablet (325 mg total) by mouth 2 (two) times daily.  3  . losartan (COZAAR) 50 MG tablet TAKE 1 TABLET ONCE DAILY. 90 tablet 0  . Multiple Vitamin (MULTIVITAMIN) tablet Take 1 tablet by mouth at bedtime.    . repaglinide (PRANDIN) 1 MG tablet Take 3 tablets (3 mg total) by mouth 2 (two) times daily before a meal. 540 tablet 3  . vitamin B-12 (CYANOCOBALAMIN) 1000 MCG tablet Take 1,000 mcg by mouth 2 (two) times daily.     . Vitamin D, Cholecalciferol, 25 MCG (1000 UT) CAPS Take 1 capsule by mouth daily.    . dapagliflozin propanediol (FARXIGA) 10 MG  TABS tablet Pt to take 1/2 tablet daily, Lot # [id number], Exp# 03/2022 14 tablet 0   No current facility-administered medications on file prior to visit.    Allergies  Allergen Reactions  . Tylenol With Codeine #3 [Acetaminophen-Codeine] Other (See Comments)    Constipation    Family History  Problem Relation Age of Onset  . Breast cancer Mother 4  . Kidney disease Mother        Dialysis  . Diabetes Mother   . Kidney cancer Mother        Renal Cell Carcinoma  . Skin cancer Father        mets  . Diabetes Father   . Melanoma Sister   . Diabetes Brother   . Tongue cancer Brother 70  . Stroke Maternal Grandmother   . Heart disease Paternal Grandmother   . Colon cancer Paternal Uncle     BP 132/60 (BP Location: Right Arm, Patient Position: Sitting, Cuff Size:  Normal)   Pulse 76   Ht 5' 6.5" (1.689 m)   Wt 239 lb (108.4 kg)   SpO2 96%   BMI 38.00 kg/m    Review of Systems     Objective:   Physical Exam VITAL SIGNS:  See vs page GENERAL: no distress Pulses: dorsalis pedis intact bilat.   MSK: no deformity of the feet CV: 1+ leg edema Skin:  no ulcer on the feet.  normal color and temp on the feet.  Neuro: sensation is intact to touch on the feet.     A1c=8.1%  Lab Results  Component Value Date   CREATININE 1.10 03/08/2020   BUN 24 (H) 03/08/2020   NA 136 03/08/2020   K 4.3 03/08/2020   CL 102 03/08/2020   CO2 27 03/08/2020      Assessment & Plan:  Type 2 DM, with stage 3a CRI: uncontrolled.   Patient Instructions  check your blood sugar twice a day.  vary the time of day when you check, between before the 3 meals, and at bedtime.  also check if you have symptoms of your blood sugar being too high or too low.  please keep a record of the readings and bring it to your next appointment here (or you can bring the meter itself).  You can write it on any piece of paper.  please call us sooner if your blood sugar goes below 70, or if you have a lot of readings over 200. I have sent a prescription to your pharmacy, to add "[name]." Please continue the same repaglinide. Here is a new meter.  I have sent a prescription to your pharmacy, for strips.  Please come back for a follow-up appointment in 2-3 months.

## 2020-10-25 NOTE — Patient Instructions (Addendum)
check your blood sugar twice a day.  vary the time of day when you check, between before the 3 meals, and at bedtime.  also check if you have symptoms of your blood sugar being too high or too low.  please keep a record of the readings and bring it to your next appointment here (or you can bring the meter itself).  You can write it on any piece of paper.  please call us sooner if your blood sugar goes below 70, or if you have a lot of readings over 200. I have sent a prescription to your pharmacy, to add "[name]." Please continue the same repaglinide. Here is a new meter.  I have sent a prescription to your pharmacy, for strips.  Please come back for a follow-up appointment in 2-3 months.

## 2020-10-26 LAB — CBC WITH DIFFERENTIAL/PLATELET
Absolute Monocytes: 660 cells/uL (ref 200–950)
Basophils Absolute: 60 cells/uL (ref 0–200)
Basophils Relative: 0.6 %
Eosinophils Absolute: 320 cells/uL (ref 15–500)
Eosinophils Relative: 3.2 %
HCT: 41.7 % (ref 35.0–45.0)
Hemoglobin: 14.3 g/dL (ref 11.7–15.5)
Lymphs Abs: 3580 cells/uL (ref 850–3900)
MCH: 31.1 pg (ref 27.0–33.0)
MCHC: 34.3 g/dL (ref 32.0–36.0)
MCV: 90.7 fL (ref 80.0–100.0)
MPV: 9.8 fL (ref 7.5–12.5)
Monocytes Relative: 6.6 %
Neutro Abs: 5380 cells/uL (ref 1500–7800)
Neutrophils Relative %: 53.8 %
Platelets: 254 10*3/uL (ref 140–400)
RBC: 4.6 10*6/uL (ref 3.80–5.10)
RDW: 12.8 % (ref 11.0–15.0)
Total Lymphocyte: 35.8 %
WBC: 10 10*3/uL (ref 3.8–10.8)

## 2020-10-26 LAB — COMPREHENSIVE METABOLIC PANEL
AG Ratio: 1.8 (calc) (ref 1.0–2.5)
ALT: 32 U/L — ABNORMAL HIGH (ref 6–29)
AST: 26 U/L (ref 10–35)
Albumin: 4.4 g/dL (ref 3.6–5.1)
Alkaline phosphatase (APISO): 104 U/L (ref 37–153)
BUN/Creatinine Ratio: 27 (calc) — ABNORMAL HIGH (ref 6–22)
BUN: 30 mg/dL — ABNORMAL HIGH (ref 7–25)
CO2: 29 mmol/L (ref 20–32)
Calcium: 10.9 mg/dL — ABNORMAL HIGH (ref 8.6–10.4)
Chloride: 104 mmol/L (ref 98–110)
Creat: 1.1 mg/dL — ABNORMAL HIGH (ref 0.60–0.88)
Globulin: 2.5 g/dL (calc) (ref 1.9–3.7)
Glucose, Bld: 106 mg/dL — ABNORMAL HIGH (ref 65–99)
Potassium: 4.5 mmol/L (ref 3.5–5.3)
Sodium: 141 mmol/L (ref 135–146)
Total Bilirubin: 0.4 mg/dL (ref 0.2–1.2)
Total Protein: 6.9 g/dL (ref 6.1–8.1)

## 2020-10-26 LAB — LIPID PANEL
Cholesterol: 185 mg/dL (ref <200)
HDL: 46 mg/dL — ABNORMAL LOW (ref >=50)
Non-HDL Cholesterol (Calc): 139 mg/dL (calc) — ABNORMAL HIGH (ref <130)
Total CHOL/HDL Ratio: 4 (calc) (ref <5.0)
Triglycerides: 416 mg/dL — ABNORMAL HIGH (ref <150)

## 2020-10-27 ENCOUNTER — Other Ambulatory Visit: Payer: Self-pay | Admitting: Physician Assistant

## 2020-10-28 ENCOUNTER — Other Ambulatory Visit: Payer: Self-pay | Admitting: *Deleted

## 2020-10-28 MED ORDER — PRAVASTATIN SODIUM 40 MG PO TABS: 40.0000 mg | ORAL_TABLET | Freq: Every day | ORAL | 3 refills | Status: DC

## 2020-11-01 ENCOUNTER — Ambulatory Visit (INDEPENDENT_AMBULATORY_CARE_PROVIDER_SITE_OTHER)
Admission: RE | Admit: 2020-11-01 | Discharge: 2020-11-01 | Disposition: A | Discharge status: Home / Self Care | Payer: Medicare PPO | Source: Ambulatory Visit | Attending: Physician Assistant | Admitting: Physician Assistant

## 2020-11-01 ENCOUNTER — Other Ambulatory Visit: Payer: Self-pay

## 2020-11-01 DIAGNOSIS — E2839 Other primary ovarian failure: Secondary | ICD-10-CM | POA: Diagnosis not present

## 2020-11-08 DIAGNOSIS — Z85828 Personal history of other malignant neoplasm of skin: Secondary | ICD-10-CM | POA: Diagnosis not present

## 2020-11-08 DIAGNOSIS — D225 Melanocytic nevi of trunk: Secondary | ICD-10-CM | POA: Diagnosis not present

## 2020-11-08 DIAGNOSIS — L853 Xerosis cutis: Secondary | ICD-10-CM | POA: Diagnosis not present

## 2020-11-08 DIAGNOSIS — L821 Other seborrheic keratosis: Secondary | ICD-10-CM | POA: Diagnosis not present

## 2020-11-08 DIAGNOSIS — L570 Actinic keratosis: Secondary | ICD-10-CM | POA: Diagnosis not present

## 2020-11-08 DIAGNOSIS — L820 Inflamed seborrheic keratosis: Secondary | ICD-10-CM | POA: Diagnosis not present

## 2020-11-08 DIAGNOSIS — L814 Other melanin hyperpigmentation: Secondary | ICD-10-CM | POA: Diagnosis not present

## 2020-12-02 ENCOUNTER — Other Ambulatory Visit: Payer: Self-pay

## 2020-12-02 ENCOUNTER — Other Ambulatory Visit (INDEPENDENT_AMBULATORY_CARE_PROVIDER_SITE_OTHER): Payer: Medicare PPO

## 2020-12-03 LAB — PTH, INTACT AND CALCIUM
Calcium: 10.9 mg/dL — ABNORMAL HIGH (ref 8.6–10.4)
PTH: 60 pg/mL (ref 14–64)

## 2020-12-04 IMAGING — DX DG LUMBAR SPINE COMPLETE 4+V
5 series · 5 of 5 positions shown · non-contrast
Comparison: None.

CLINICAL DATA: Low back pain

EXAM:
LUMBAR SPINE - COMPLETE 4+ VIEW

[l-spine ap]
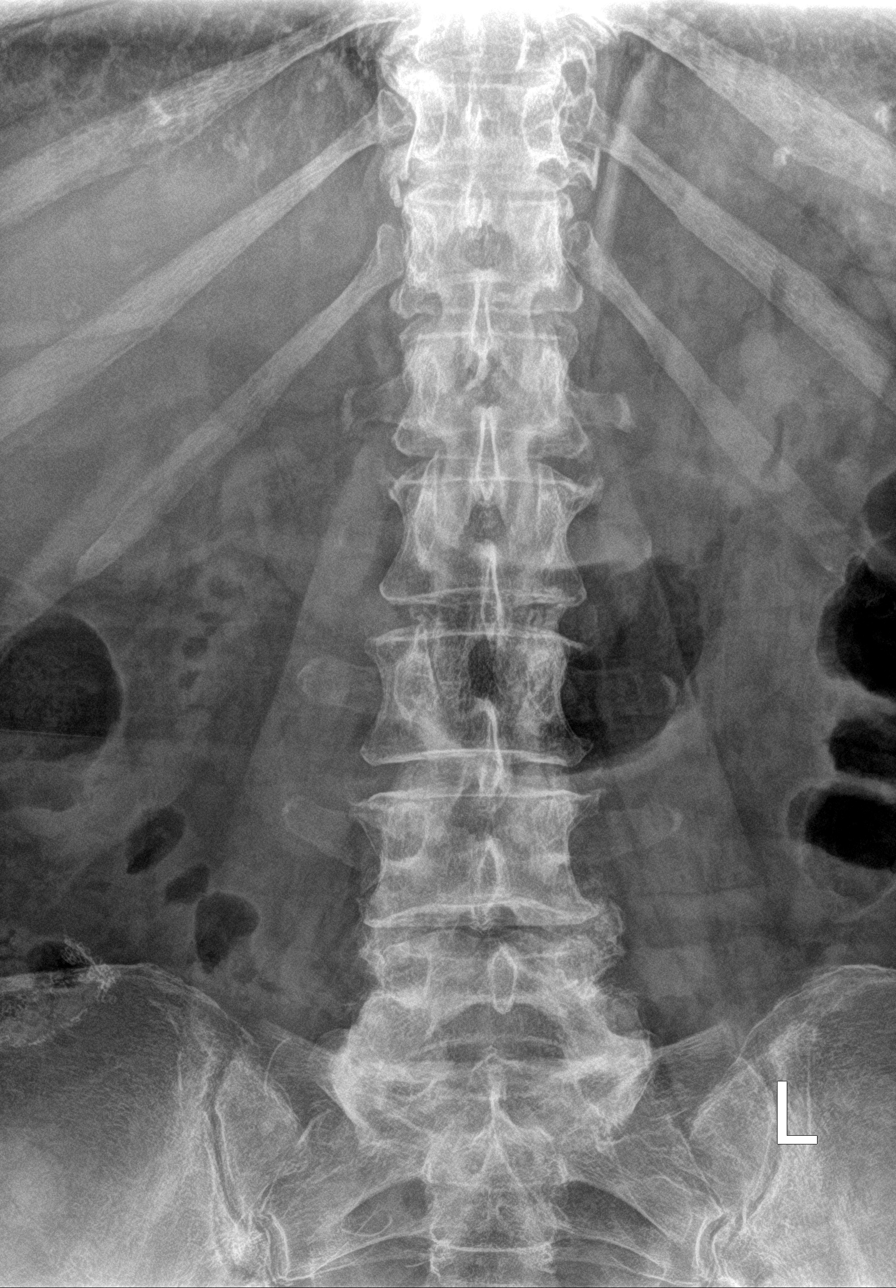

[l-spine obl (1 of 2)]
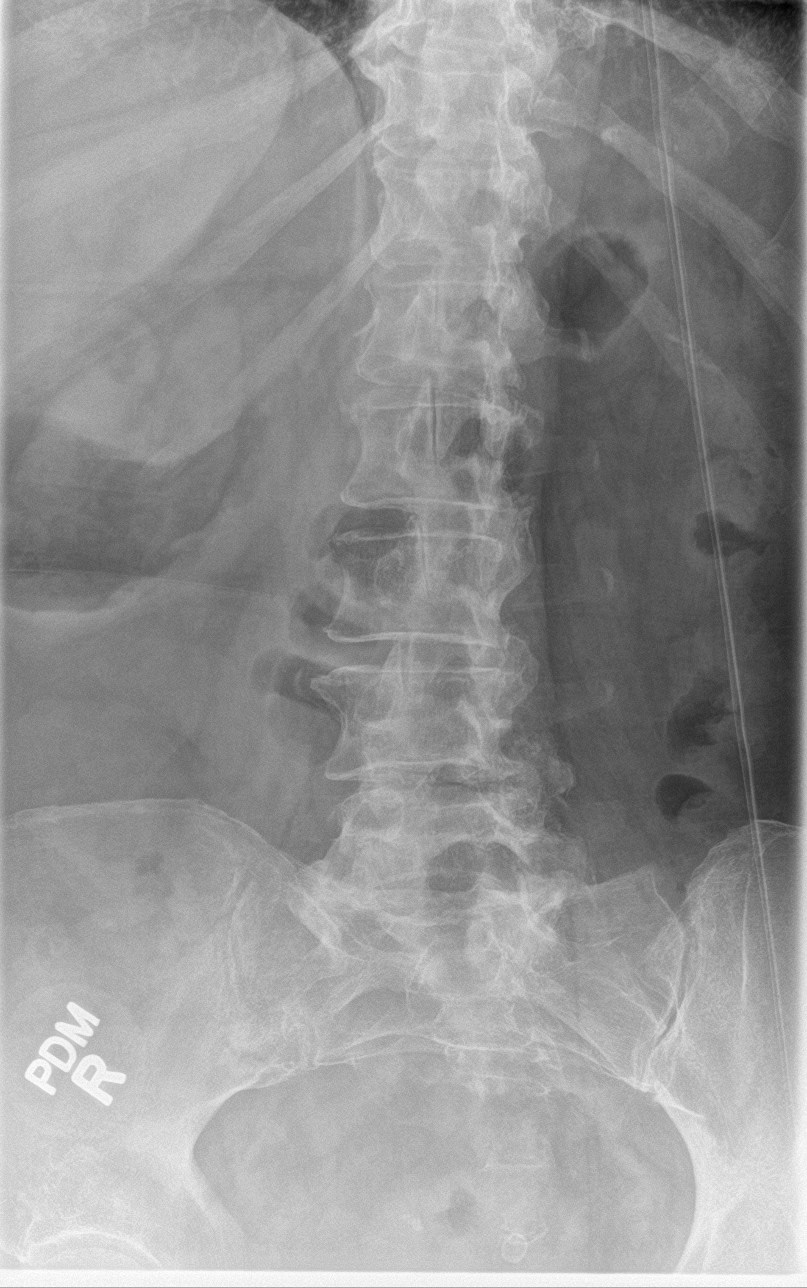

[l-spine obl (2 of 2)]
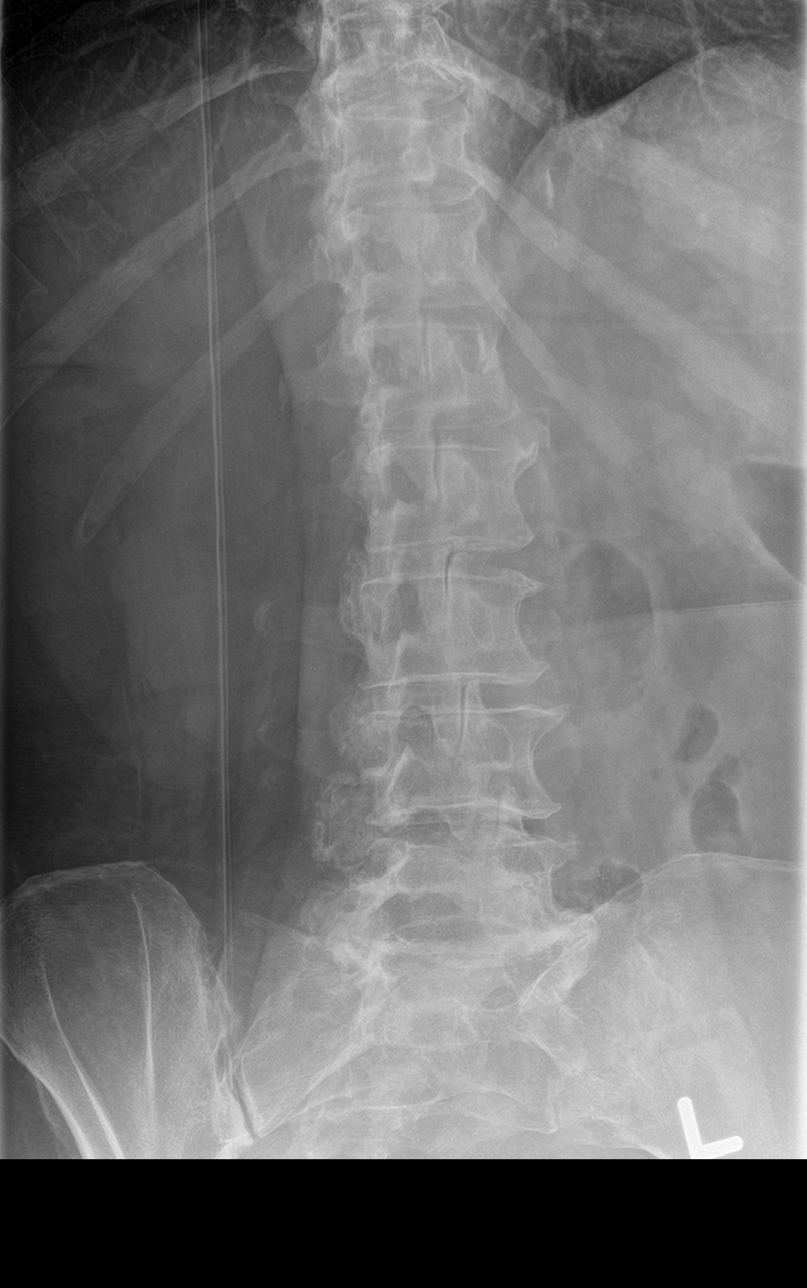

[l-spine lat]
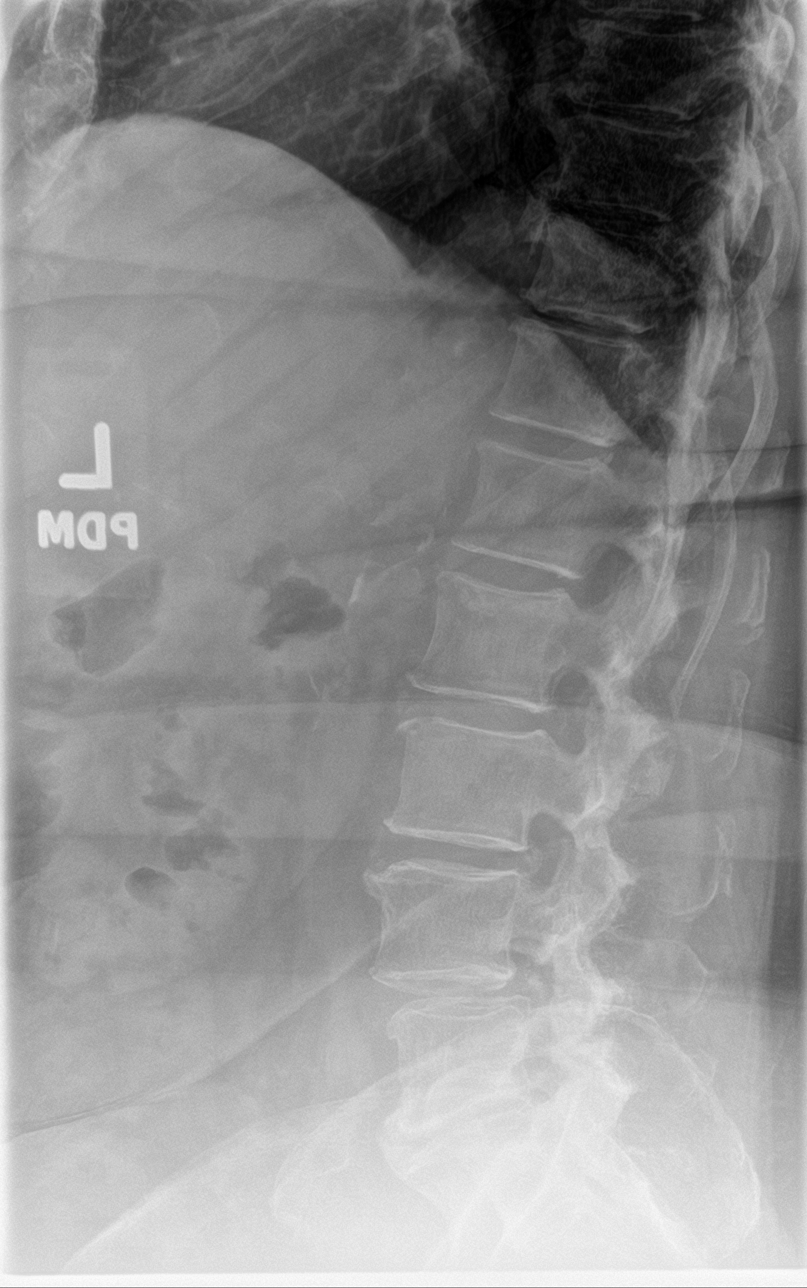

[l-spine spot]
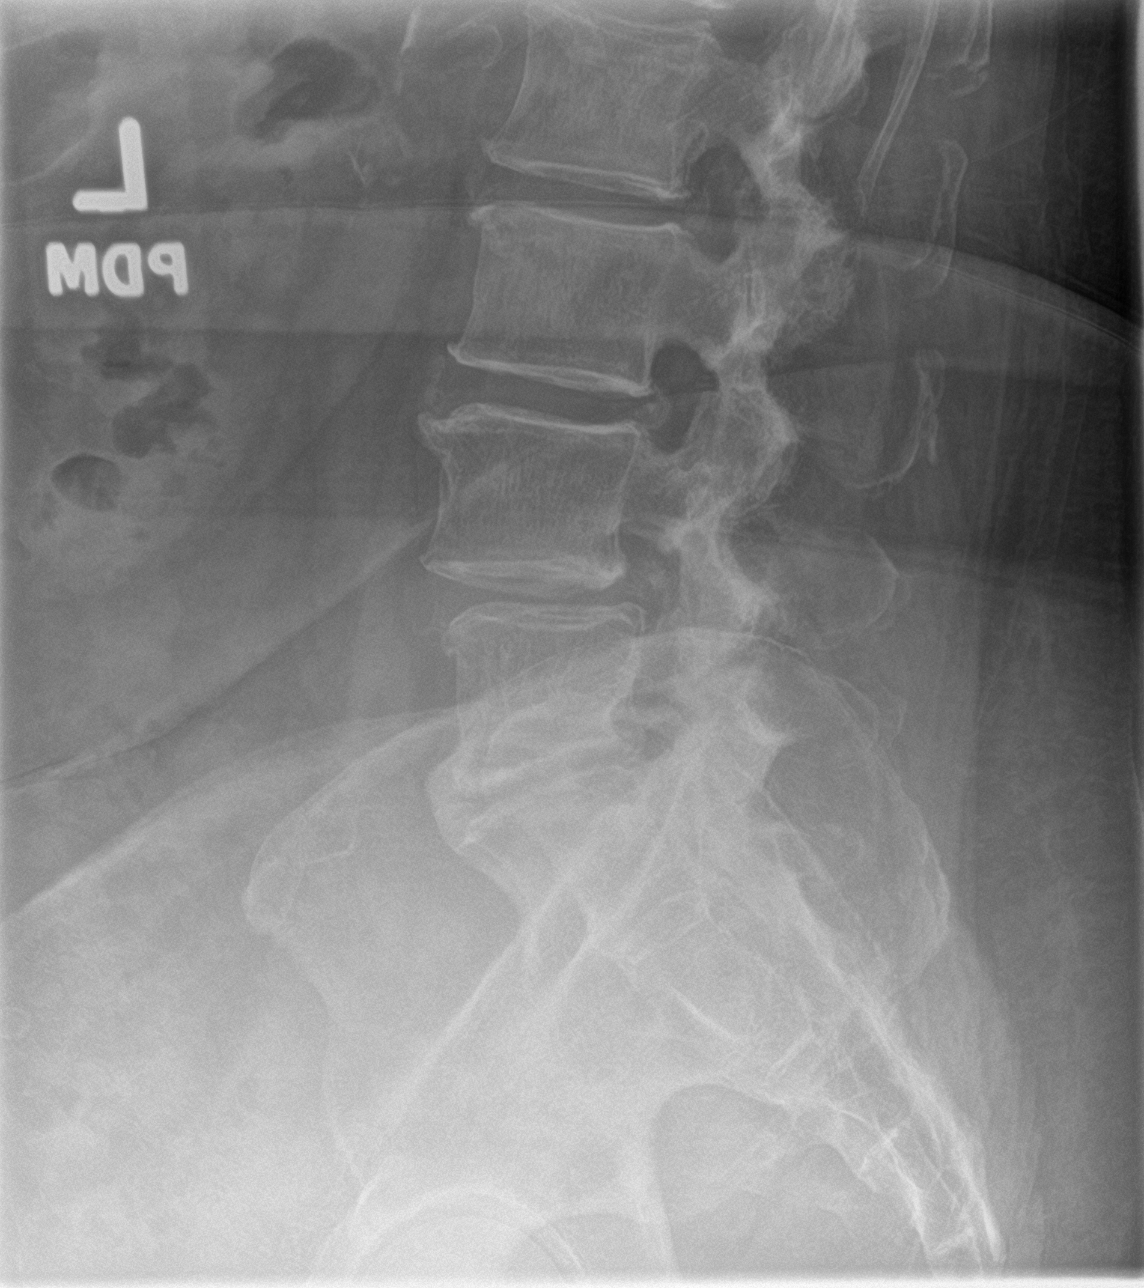

[5 of 5 positions shown; findings below may reference images not displayed]

FINDINGS: Frontal, lateral, spot lumbosacral lateral, and bilateral oblique
views were obtained. There are 5 non-rib-bearing lumbar type
vertebral bodies. There is no fracture. There is 4 mm of
anterolisthesis of L4 on L5. There is no other spondylolisthesis.
There is moderate disc space narrowing at L4-5 with slightly more
severe disc space narrowing at L5-S1. Disc spaces at other levels
appear unremarkable. There is facet osteoarthritic change at L3-4,
L4-5, and L5-S1 bilaterally. There is aortic atherosclerosis.
IMPRESSION: Osteoarthritic change at L3-4, L4-5, L5-S1, most severe at L5-S1.
Spondylolisthesis at L4-5 is felt to be due to underlying
spondylosis. No other spondylolisthesis. No fracture.

Aortic Atherosclerosis (1092E-VXS.S).

## 2020-12-17 ENCOUNTER — Other Ambulatory Visit: Payer: Self-pay | Admitting: Physician Assistant

## 2021-01-24 ENCOUNTER — Ambulatory Visit: Payer: Medicare PPO | Admitting: Endocrinology

## 2021-01-24 ENCOUNTER — Other Ambulatory Visit: Payer: Self-pay

## 2021-01-24 VITALS — BP 130/60 | HR 106 | Ht 66.5 in | Wt 228.0 lb

## 2021-01-24 DIAGNOSIS — E119 Type 2 diabetes mellitus without complications: Secondary | ICD-10-CM | POA: Diagnosis not present

## 2021-01-24 LAB — POCT GLYCOSYLATED HEMOGLOBIN (HGB A1C): Hemoglobin A1C: 7.4 % — AB (ref 4.0–5.6)

## 2021-01-24 NOTE — Progress Notes (Signed)
Subjective:    Patient ID: Marie Rhodes, female    DOB: 07-19-1936, 85 y.o.   MRN: [id number]  HPI Pt returns for f/u of diabetes mellitus:  DM type: 2    Dx'ed: 2094 Complications: stage 3a CRI.   Therapy: 2 oral meds.   GDM: never DKA: never Severe hypoglycemia: never Pancreatitis: never Other: she has never taken insulin; Crohn's is a relative contraindication to GLP meds.   Interval history: pt states she feels well in general.  She takes meds as rx'ed.  She says cbg's are in the 100's.   Past Medical History:  Diagnosis Date  . ANXIETY 04/27/2007  . CROHN'S DISEASE, SMALL INTESTINE 06/08/2008   ileum, cecum  . DEPRESSION 04/27/2007  . Fatty tumor    left shoulder  . GOITER, MULTINODULAR 05/04/2008  . Hyperlipemia   . Molar pregnancy 1967  . OBESITY 08/01/2010  . UNSPECIFIED ANEMIA 05/04/2008   Mild Anemia  . Vitamin D deficiency 04/08/2018    Past Surgical History:  Procedure Laterality Date  . APPENDECTOMY    . BIOPSY THYROID    . COLONOSCOPY  08/10/2006   normal  . LEFT OOPHORECTOMY  1992  . MECKEL DIVERTICULUM EXCISION     Ileal and cecal resection     Social History   Socioeconomic History  . Marital status: Widowed    Spouse name: Not on file  . Number of children: Not on file  . Years of education: Not on file  . Highest education level: Not on file  Occupational History  . 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 Smoking status: Former Smoker    Quit date: 05/29/1988    Years since quitting: 32.6  . Smokeless tobacco: Never Used  Substance and Sexual Activity  . Alcohol use: No  . Drug use: No  . Sexual activity: Not on file  Other Topics Concern  . Not on file  Social History Narrative   Patient is widowed --husband died from brain aneurysm, she is a retired Pharmacist, hospital (second grade) and currently works at an [employer] 2 days a week.   Former Research scientist (life sciences) (college only), no alcohol or drug use   Both of her  grandchildren lived with her while they went to college    As of November 2019, she lives alone   Social Determinants of Radio broadcast assistant Strain: Not on file  Food Insecurity: Not on file  Transportation Needs: Not on file  Physical Activity: Not on file  Stress: Not on file  Social Connections: Not on file  Intimate Partner Violence: Not on file    Current Outpatient Medications on File Prior to Visit  Medication Sig Dispense Refill  . ALPRAZolam (XANAX) 0.25 MG tablet TAKE AS NEEDED FOR ANXIETY. 30 tablet 0  . dapagliflozin propanediol (FARXIGA) 5 MG TABS tablet Take 1 tablet (5 mg total) by mouth daily. 90 tablet 3  . ferrous sulfate 325 (65 FE) MG tablet Take 1 tablet (325 mg total) by mouth 2 (two) times daily.  3  . glucose blood (ONETOUCH VERIO) test strip 1 each by Other route as needed for other. And lancets 1/day 100 each 12  . losartan (COZAAR) 50 MG tablet TAKE 1 TABLET ONCE DAILY. 90 tablet 0  . Multiple Vitamin (MULTIVITAMIN) tablet Take 1 tablet by mouth at bedtime.    . pravastatin (PRAVACHOL) 40 MG tablet Take 1 tablet (40 mg total) by mouth daily.  90 tablet 3  . repaglinide (PRANDIN) 1 MG tablet Take 3 tablets (3 mg total) by mouth 2 (two) times daily before a meal. 540 tablet 3  . vitamin B-12 (CYANOCOBALAMIN) 1000 MCG tablet Take 1,000 mcg by mouth 2 (two) times daily.     . Vitamin D, Cholecalciferol, 25 MCG (1000 UT) CAPS Take 1 capsule by mouth daily.     No current facility-administered medications on file prior to visit.    Allergies  Allergen Reactions  . Tylenol With Codeine #3 [Acetaminophen-Codeine] Other (See Comments)    Constipation    Family History  Problem Relation Age of Onset  . Breast cancer Mother 50  . Kidney disease Mother        Dialysis  . Diabetes Mother   . Kidney cancer Mother        Renal Cell Carcinoma  . Skin cancer Father        mets  . Diabetes Father   . Melanoma Sister   . Diabetes Brother   . Tongue  cancer Brother 37  . Stroke Maternal Grandmother   . Heart disease Paternal Grandmother   . Colon cancer Paternal Uncle     BP 130/60 (BP Location: Right Arm, Patient Position: Sitting, Cuff Size: Large)   Pulse (!) 106   Ht 5' 6.5" (1.689 m)   Wt 228 lb (103.4 kg)   SpO2 94%   BMI 36.25 kg/m    Review of Systems     Objective:   Physical Exam VITAL SIGNS:  See vs page GENERAL: no distress Pulses: dorsalis pedis intact bilat.   MSK: no deformity of the feet CV: no leg edema Skin:  no ulcer on the feet.  normal color and temp on the feet. Neuro: sensation is intact to touch on the feet.    A1c=7.4%       Assessment & Plan:  Type 2 DM: well-controlled given age and limitations on rx options (CRI, Crohn's).    Patient Instructions  check your blood sugar twice a day.  vary the time of day when you check, between before the 3 meals, and at bedtime.  also check if you have symptoms of your blood sugar being too high or too low.  please keep a record of the readings and bring it to your next appointment here (or you can bring the meter itself).  You can write it on any piece of paper.  please call us sooner if your blood sugar goes below 70, or if you have a lot of readings over 200.   Please continue the same 2 diabetes medications.   Please come back for a follow-up appointment in 6 months.

## 2021-01-24 NOTE — Patient Instructions (Addendum)
check your blood sugar twice a day.  vary the time of day when you check, between before the 3 meals, and at bedtime.  also check if you have symptoms of your blood sugar being too high or too low.  please keep a record of the readings and bring it to your next appointment here (or you can bring the meter itself).  You can write it on any piece of paper.  please call us sooner if your blood sugar goes below 70, or if you have a lot of readings over 200.   Please continue the same 2 diabetes medications.   Please come back for a follow-up appointment in 6 months.

## 2021-01-27 ENCOUNTER — Encounter: Payer: Self-pay | Admitting: Physician Assistant

## 2021-01-27 ENCOUNTER — Ambulatory Visit: Payer: Medicare PPO | Admitting: Physician Assistant

## 2021-01-27 ENCOUNTER — Ambulatory Visit (INDEPENDENT_AMBULATORY_CARE_PROVIDER_SITE_OTHER): Payer: Medicare PPO

## 2021-01-27 ENCOUNTER — Encounter: Payer: Self-pay | Admitting: Endocrinology

## 2021-01-27 ENCOUNTER — Other Ambulatory Visit: Payer: Self-pay

## 2021-01-27 VITALS — BP 110/60 | HR 107 | Temp 98.2°F | Ht 66.5 in | Wt 225.5 lb

## 2021-01-27 DIAGNOSIS — I10 Essential (primary) hypertension: Secondary | ICD-10-CM | POA: Diagnosis not present

## 2021-01-27 DIAGNOSIS — Z0000 Encounter for general adult medical examination without abnormal findings: Secondary | ICD-10-CM

## 2021-01-27 NOTE — Patient Instructions (Signed)
It was great to see you!  We are going to update your blood work today.  No changes to medication at this time.   I will reach out to [facility] about your calcium.  Please reach out to [facility] about stool changes.  Let's follow-up in 4 months.  Take care,  Inda Coke PA-C

## 2021-01-27 NOTE — Progress Notes (Addendum)
Subjective:   Marie Rhodes is a 85 y.o. female who presents for Medicare Annual (Subsequent) preventive examination.  Review of Systems     Cardiac Risk Factors include: advanced age (>38mn, >>55women);diabetes mellitus;obesity (BMI >30kg/m2)     Objective:    There were no vitals filed for this visit. There is no height or weight on file to calculate BMI.  Advanced Directives 01/27/2021 10/19/2019 03/26/2018 10/08/2017 10/05/2016 01/28/2016 12/30/2015  Does Patient Have a Medical Advance Directive? Yes No No No No No No  Does patient want to make changes to medical advance directive? Yes (MAU/Ambulatory/Procedural Areas - Information given) - - - - - -  Would patient like information on creating a medical advance directive? - Yes (MAU/Ambulatory/Procedural Areas - Information given) - - - - No - patient declined information    Current Medications (verified) Outpatient Encounter Medications as of 01/27/2021  Medication Sig  . ALPRAZolam (XANAX) 0.25 MG tablet TAKE AS NEEDED FOR ANXIETY.  . Calcium Carbonate (CALTRATE 600 PO) Take 1 tablet by mouth daily in the afternoon.  . dapagliflozin propanediol (FARXIGA) 5 MG TABS tablet Take 1 tablet (5 mg total) by mouth daily.  . ferrous sulfate 325 (65 FE) MG tablet Take 1 tablet (325 mg total) by mouth 2 (two) times daily.  .Marland Kitchenglucose blood (ONETOUCH VERIO) test strip 1 each by Other route as needed for other. And lancets 1/day  . losartan (COZAAR) 50 MG tablet TAKE 1 TABLET ONCE DAILY.  . Multiple Vitamin (MULTIVITAMIN) tablet Take 1 tablet by mouth at bedtime.  . pravastatin (PRAVACHOL) 40 MG tablet Take 1 tablet (40 mg total) by mouth daily.  . repaglinide (PRANDIN) 1 MG tablet Take 3 tablets (3 mg total) by mouth 2 (two) times daily before a meal.  . vitamin B-12 (CYANOCOBALAMIN) 1000 MCG tablet Take 1,000 mcg by mouth 2 (two) times daily.   . Vitamin D, Cholecalciferol, 25 MCG (1000 UT) CAPS Take 1 capsule by mouth daily. d3   No  facility-administered encounter medications on file as of 01/27/2021.    Allergies (verified) Tylenol with codeine #3 [acetaminophen-codeine]   History: Past Medical History:  Diagnosis Date  . ANXIETY 04/27/2007  . CROHN'S DISEASE, SMALL INTESTINE 06/08/2008   ileum, cecum  . DEPRESSION 04/27/2007  . Fatty tumor    left shoulder  . GOITER, MULTINODULAR 05/04/2008  . Hyperlipemia   . Molar pregnancy 1967  . OBESITY 08/01/2010  . UNSPECIFIED ANEMIA 05/04/2008   Mild Anemia  . Vitamin D deficiency 04/08/2018   Past Surgical History:  Procedure Laterality Date  . APPENDECTOMY    . BIOPSY THYROID    . COLONOSCOPY  08/10/2006   normal  . LEFT OOPHORECTOMY  1992  . MECKEL DIVERTICULUM EXCISION     Ileal and cecal resection    Family History  Problem Relation Age of Onset  . Breast cancer Mother 798 . Kidney disease Mother        Dialysis  . Diabetes Mother   . Kidney cancer Mother        Renal Cell Carcinoma  . Skin cancer Father        mets  . Diabetes Father   . Melanoma Sister   . Diabetes Brother   . Tongue cancer Brother [phone] . Stroke Maternal Grandmother   . Heart disease Paternal Grandmother   . Colon cancer Paternal Uncle    Social History   Socioeconomic History  . Marital status: Widowed  Spouse name: Not on file  . Number of children: Not on file  . Years of education: Not on file  . Highest education level: Not on file  Occupational History  . 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 Smoking status: Former Smoker    Quit date: 05/29/1988    Years since quitting: 32.6  . Smokeless tobacco: Never Used  Substance and Sexual Activity  . Alcohol use: No  . Drug use: No  . Sexual activity: Not on file  Other Topics Concern  . Not on file  Social History Narrative   Patient is widowed --husband died from brain aneurysm, she is a retired Pharmacist, hospital (second grade) and currently works at an [employer] 2 days a week.    Former Smoker (college only), no alcohol or drug use   Both of her grandchildren lived with her while they went to college    As of November 2019, she lives alone   Social Determinants of Health   Financial Resource Strain: [place]   . Difficulty of Paying Living Expenses: Not hard at all  Food Insecurity: No Food Insecurity  . Worried About Charity fundraiser in the Last Year: Never true  . Ran Out of Food in the Last Year: Never true  Transportation Needs: No Transportation Needs  . Lack of Transportation (Medical): No  . Lack of Transportation (Non-Medical): No  Physical Activity: Inactive  . Days of Exercise per Week: 0 days  . Minutes of Exercise per Session: 0 min  Stress: No Stress Concern Present  . Feeling of Stress : Not at all  Social Connections: Moderately Isolated  . Frequency of Communication with Friends and Family: Three times a week  . Frequency of Social Gatherings with Friends and Family: Twice a week  . Attends Religious Services: More than 4 times per year  . Active Member of Clubs or Organizations: No  . Attends Archivist Meetings: Never  . Marital Status: Widowed    Tobacco Counseling Counseling given: Not Answered   Clinical Intake:  Pre-visit preparation completed: Yes        BMI - recorded: 35.85 Nutritional Status: BMI > 30  Obese Nutritional Risks: None Diabetes: Yes CBG done?: Yes (104) CBG resulted in Enter/ Edit results?: No Did pt. bring in CBG monitor from home?: No  How often do you need to have someone help you when you read instructions, pamphlets, or other written materials from your doctor or pharmacy?: 1 - Never  Diabetic?Nutrition Risk Assessment:  Has the patient had any N/V/D within the last 2 months?  No  Does the patient have any non-healing wounds?  No  Has the patient had any unintentional weight loss or weight gain?  No   Diabetes:  Is the patient diabetic?  Yes  If diabetic, was a CBG obtained  today?  Yes  Did the patient bring in their glucometer from home?  No  How often do you monitor your CBG's? Daily    Financial Strains and Diabetes Management:  Are you having any financial strains with the device, your supplies or your medication? No .  Does the patient want to be seen by Chronic Care Management for management of their diabetes?  No  Would the patient like to be referred to a Nutritionist or for Diabetic Management?  No   Diabetic Exams:  Diabetic Eye Exam: Completed 05/24/20 Diabetic Foot Exam: Completed 01/24/21  Interpreter Needed?: No  Information entered by :: Charlott Rakes, LPN   Activities of Daily Living In your present state of health, do you have any difficulty performing the following activities: 01/27/2021 10/25/2020  Hearing? Y N  Comment mild loss -  Vision? N N  Difficulty concentrating or making decisions? N N  Walking or climbing stairs? N N  Dressing or bathing? N N  Doing errands, shopping? N N  Preparing Food and eating ? N -  Using the Toilet? N -  In the past six months, have you accidently leaked urine? N -  Do you have problems with loss of bowel control? N -  Managing your Medications? N -  Managing your Finances? N -  Housekeeping or managing your Housekeeping? N -  Some recent data might be hidden    Patient Care Team: Inda Coke, [place] as PCP - General (Physician Assistant) Gatha Mayer, MD as Attending Physician (Gastroenterology) Lorelle Gibbs, MD (Radiology) Rolm Bookbinder, MD as Attending Physician (Dermatology) Renato Shin, MD as Consulting Physician (Endocrinology) Drake Leach, [place] as Consulting Physician (Optometry)  Indicate any recent Medical Services you may have received from other than [facility] providers in the past year (date may be approximate).     Assessment:   This is a routine wellness examination for Pema.  Hearing/Vision screen No exam data present  Dietary issues and exercise  activities discussed:    Goals Addressed            This Visit's Progress   . Patient Stated       Lose weight       Depression Screen PHQ 2/9 Scores 01/27/2021 10/25/2020 10/19/2019 06/16/2019 12/06/2015  PHQ - 2 Score 0 0 0 0 0  PHQ- 9 Score - 1 2 0 -    Fall Risk Fall Risk  01/27/2021 10/19/2019 12/06/2015  Falls in the past year? 0 0 No  Number falls in past yr: 0 0 -  Injury with Fall? 0 0 -  Risk for fall due to : Impaired vision;Impaired balance/gait - -  Risk for fall due to: Comment a little wobble at times - -  Follow up Falls prevention discussed Falls evaluation completed;Education provided;Falls prevention discussed -    FALL RISK PREVENTION PERTAINING TO THE HOME:  Any stairs in or around the home? Yes  If so, are there any without handrails? No  Home free of loose throw rugs in walkways, pet beds, electrical cords, etc? Yes  Adequate lighting in your home to reduce risk of falls? Yes   ASSISTIVE DEVICES UTILIZED TO PREVENT FALLS:  Life alert? No  Use of a cane, walker or w/c? No  Grab bars in the bathroom? No  Shower chair or bench in shower? Yes  Elevated toilet seat or a handicapped toilet? No   TIMED UP AND GO:  Was the test performed? Yes .  Length of time to ambulate 10 feet: 10 sec.   Gait steady and fast without use of assistive device  Cognitive Function:     6CIT Screen 01/27/2021  What Year? 0 points  What month? 0 points  Count back from 20 0 points  Months in reverse 0 points  Repeat phrase 0 points    Immunizations Immunization History  Administered Date(s) Administered  . Fluad Quad(high Dose 65+) 06/16/2019, 06/17/2020  . Influenza Split 09/10/2011  . Influenza, High Dose Seasonal PF 08/15/2018  . Influenza,inj,Quad PF,6+ Mos 10/08/2017  . Pneumococcal Conjugate-13 01/22/2015  .  Pneumococcal Polysaccharide-23 01/26/2006  . Td 01/26/2005  . Tdap 10/10/2017  . Zoster 01/22/2015    TDAP status: Up to date  Flu Vaccine  status: Up to date  Pneumococcal vaccine status: Up to date  Covid-19 vaccine status: Declined, Education has been provided regarding the importance of this vaccine but patient still declined. Advised may receive this vaccine at local pharmacy or Health Dept.or vaccine clinic. Aware to provide a copy of the vaccination record if obtained from local pharmacy or Health Dept. Verbalized acceptance and understanding.  Qualifies for Shingles Vaccine? Yes   Zostavax completed Yes   Shingrix Completed?: No.    Education has been provided regarding the importance of this vaccine. Patient has been advised to call insurance company to determine out of pocket expense if they have not yet received this vaccine. Advised may also receive vaccine at local pharmacy or Health Dept. Verbalized acceptance and understanding.  Screening Tests Health Maintenance  Topic Date Due  . MAMMOGRAM  Never done  . COVID-19 Vaccine (1) 02/12/2021 (Originally 06/03/1941)  . INFLUENZA VACCINE  04/28/2021  . OPHTHALMOLOGY EXAM  05/24/2021  . HEMOGLOBIN A1C  07/26/2021  . FOOT EXAM  01/24/2022  . TETANUS/TDAP  10/11/2027  . DEXA SCAN  Completed  . PNA vac Low Risk Adult  Completed  . HPV VACCINES  Aged Out    Health Maintenance  Health Maintenance Due  Topic Date Due  . MAMMOGRAM  Never done    Colorectal cancer screening: No longer required.   Mammogram status: Completed 05/31/20. Repeat every year  Bone Density status: Completed 11/01/20. Results reflect: Bone density results: OSTEOPENIA. Repeat every 0 years.  Additional Screening:   Vision Screening: Recommended annual ophthalmology exams for early detection of glaucoma and other disorders of the eye. Is the patient up to date with their annual eye exam?  Yes  Who is the provider or what is the name of the office in which the patient attends annual eye exams? [facility]  If pt is not established with a provider, would they like to be referred to a  provider to establish care? No .   Dental Screening: Recommended annual dental exams for proper oral hygiene  Community Resource Referral / Chronic Care Management: CRR required this visit?  No   CCM required this visit?  No      Plan:     I have personally reviewed and noted the following in the patient's chart:   . Medical and social history . Use of alcohol, tobacco or illicit drugs  . Current medications and supplements including opioid prescriptions.  . Functional ability and status . Nutritional status . Physical activity . Advanced directives . List of other physicians . Hospitalizations, surgeries, and ER visits in previous 12 months . Vitals . Screenings to include cognitive, depression, and falls . Referrals and appointments  In addition, I have reviewed and discussed with patient certain preventive protocols, quality metrics, and best practice recommendations. A written personalized care plan for preventive services as well as general preventive health recommendations were provided to patient.     Willette Brace, LPN   01/27/7615   Nurse Notes: none

## 2021-01-27 NOTE — Progress Notes (Signed)
Marie Rhodes is a 85 y.o. female is here for follow up.  I acted as a Education administrator for [employer], PA-C Marie Pickler, LPN   History of Present Illness:   Chief Complaint  Patient presents with  . Hypertension    HPI  Hypertension Pt following up today on her blood pressure. Currently taking Losartan 50 mg daily. Does not check blood pressure at home. Pt denies headaches, dizziness, blurred vision, chest pain, SOB or lower leg edema. Denies excessive caffeine intake, stimulant usage, excessive alcohol intake or increase in salt consumption. Has reduced intake of sugary beverages. She is intentionally losing weight.  BP Readings from Last 3 Encounters:  01/27/21 110/60  01/24/21 130/60  10/25/20 (!) 146/88   Hypercalcemia This has been persistent x 1 year. PTH checked a month ago and was normal. She was advised to follow-up with her already established endocrinologist that she sees but she forgot to discuss with him. She is taking calcium supplement for osteopenia.  Health Maintenance Due  Topic Date Due  . MAMMOGRAM  Never done    Past Medical History:  Diagnosis Date  . ANXIETY 04/27/2007  . CROHN'S DISEASE, SMALL INTESTINE 06/08/2008   ileum, cecum  . DEPRESSION 04/27/2007  . Fatty tumor    left shoulder  . GOITER, MULTINODULAR 05/04/2008  . Hyperlipemia   . Molar pregnancy 1967  . OBESITY 08/01/2010  . UNSPECIFIED ANEMIA 05/04/2008   Mild Anemia  . Vitamin D deficiency 04/08/2018     Social History   Tobacco Use  . Smoking status: Former Smoker    Quit date: 05/29/1988    Years since quitting: 32.6  . Smokeless tobacco: Never Used  Substance Use Topics  . Alcohol use: No  . Drug use: No    Past Surgical History:  Procedure Laterality Date  . APPENDECTOMY    . BIOPSY THYROID    . COLONOSCOPY  08/10/2006   normal  . LEFT OOPHORECTOMY  1992  . MECKEL DIVERTICULUM EXCISION     Ileal and cecal resection     Family History  Problem Relation Age of  Onset  . Breast cancer Mother 35  . Kidney disease Mother        Dialysis  . Diabetes Mother   . Kidney cancer Mother        Renal Cell Carcinoma  . Skin cancer Father        mets  . Diabetes Father   . Melanoma Sister   . Diabetes Brother   . Tongue cancer Brother 44  . Stroke Maternal Grandmother   . Heart disease Paternal Grandmother   . Colon cancer Paternal Uncle     PMHx, SurgHx, SocialHx, FamHx, Medications, and Allergies were reviewed in the Visit Navigator and updated as appropriate.   Patient Active Problem List   Diagnosis Date Noted  . Vitamin D deficiency 04/08/2018  . Iron deficiency 02/17/2018  . B12 deficiency 02/17/2018  . Shoulder swelling 10/08/2017  . Hypercalcemia 02/08/2017  . Arthralgia 01/22/2015  . Diabetes ([place]) 03/05/2014  . Abnormal ECG 09/12/2012  . Retinal detachment 09/12/2012  . Cough 06/28/2012  . Menopause 09/10/2011  . Renal insufficiency 12/22/2010  . OBESITY 08/01/2010  . HYPERCHOLESTEROLEMIA 06/24/2010  . NASH (nonalcoholic steatohepatitis) 06/23/2009  . CROHN'S DISEASE, SMALL INTESTINE 06/08/2008  . GOITER, MULTINODULAR 05/04/2008  . GERD 01/19/2008  . ANXIETY 04/27/2007  . DEPRESSION 04/27/2007  . Essential hypertension 04/27/2007    Social History   Tobacco Use  .  Smoking status: Former Smoker    Quit date: 05/29/1988    Years since quitting: 32.6  . Smokeless tobacco: Never Used  Substance Use Topics  . Alcohol use: No  . Drug use: No    Current Medications and Allergies:    Current Outpatient Medications:  .  ALPRAZolam (XANAX) 0.25 MG tablet, TAKE AS NEEDED FOR ANXIETY., Disp: 30 tablet, Rfl: 0 .  Calcium Carbonate (CALTRATE 600 PO), Take 1 tablet by mouth daily in the afternoon., Disp: , Rfl:  .  dapagliflozin propanediol (FARXIGA) 5 MG TABS tablet, Take 1 tablet (5 mg total) by mouth daily., Disp: 90 tablet, Rfl: 3 .  ferrous sulfate 325 (65 FE) MG tablet, Take 1 tablet (325 mg total) by mouth 2 (two) times  daily., Disp: , Rfl: 3 .  glucose blood (ONETOUCH VERIO) test strip, 1 each by Other route as needed for other. And lancets 1/day, Disp: 100 each, Rfl: 12 .  losartan (COZAAR) 50 MG tablet, TAKE 1 TABLET ONCE DAILY., Disp: 90 tablet, Rfl: 0 .  Multiple Vitamin (MULTIVITAMIN) tablet, Take 1 tablet by mouth at bedtime., Disp: , Rfl:  .  pravastatin (PRAVACHOL) 40 MG tablet, Take 1 tablet (40 mg total) by mouth daily., Disp: 90 tablet, Rfl: 3 .  repaglinide (PRANDIN) 1 MG tablet, Take 3 tablets (3 mg total) by mouth 2 (two) times daily before a meal., Disp: 540 tablet, Rfl: 3 .  vitamin B-12 (CYANOCOBALAMIN) 1000 MCG tablet, Take 1,000 mcg by mouth 2 (two) times daily. , Disp: , Rfl:  .  Vitamin D, Cholecalciferol, 25 MCG (1000 UT) CAPS, Take 1 capsule by mouth daily. d3, Disp: , Rfl:    Allergies  Allergen Reactions  . Tylenol With Codeine #3 [Acetaminophen-Codeine] Other (See Comments)    Constipation    Review of Systems   ROS Negative unless otherwise specified per HPI.  Vitals:   Vitals:   01/27/21 1430  BP: 110/60  Pulse: (!) 107  Temp: 98.2 F (36.8 C)  TempSrc: Temporal  SpO2: 94%  Weight: 225 lb 8 oz (102.3 kg)  Height: 5' 6.5" (1.689 m)     Body mass index is 35.85 kg/m.   Physical Exam:    Physical Exam Vitals and nursing note reviewed.  Constitutional:      General: She is not in acute distress.    Appearance: She is well-developed. She is not ill-appearing or toxic-appearing.  Cardiovascular:     Rate and Rhythm: Normal rate and regular rhythm.     Pulses: Normal pulses.     Heart sounds: Normal heart sounds, S1 normal and S2 normal.     Comments: No LE edema Pulmonary:     Effort: Pulmonary effort is normal.     Breath sounds: Normal breath sounds.  Skin:    General: Skin is warm and dry.  Neurological:     Mental Status: She is alert.     GCS: GCS eye subscore is 4. GCS verbal subscore is 5. GCS motor subscore is 6.  Psychiatric:        Speech:  Speech normal.        Behavior: Behavior normal. Behavior is cooperative.      Assessment and Plan:    Marie Rhodes was seen today for hypertension.  Diagnoses and all orders for this visit:  Essential hypertension  Normotensive in office. Discussed that if she continues to lose weight, we will likely be able to reduce losartan dosage.  She is currently on losartan  50 mg daily. Does not feel comfortable monitoring her blood pressure regularly with her current cuff. Recommended close follow-up if continues to lose weight or if develops sx of low BP such as lightheadedness or dizziness. Follow-up in 4 months. -     Comprehensive metabolic panel  Hypercalcemia Will send message to her endocrinologist to discuss next steps on work-up.   CMA or LPN served as scribe during this visit. History, Physical, and Plan performed by medical provider. The above documentation has been reviewed and is accurate and complete.   Inda Coke, PA-C [place], [place] 01/27/2021  Follow-up: No follow-ups on file.

## 2021-01-27 NOTE — Patient Instructions (Signed)
Marie Rhodes , Thank you for taking time to come for your Medicare Wellness Visit. I appreciate your ongoing commitment to your health goals. Please review the following plan we discussed and let me know if I can assist you in the future.   Screening recommendations/referrals: Colonoscopy: Done 12/30/15 Mammogram: Done 05/31/20 Bone Density: Done 11/01/20 Recommended yearly ophthalmology/optometry visit for glaucoma screening and checkup Recommended yearly dental visit for hygiene and checkup  Vaccinations: Influenza vaccine: Up to date Pneumococcal vaccine: Up to date Tdap vaccine: Up to date Shingles vaccine: Shingrix discussed. Please contact your pharmacy for coverage information.    Covid-19:Declined and discussed   Advanced directives: Please bring a copy of your health care power of attorney and living will to the office at your convenience.   Conditions/risks identified: Lose weight   Next appointment: Follow up in one year for your annual wellness visit    Preventive Care 65 Years and Older, Female Preventive care refers to lifestyle choices and visits with your health care provider that can promote health and wellness. What does preventive care include?  A yearly physical exam. This is also called an annual well check.  Dental exams once or twice a year.  Routine eye exams. Ask your health care provider how often you should have your eyes checked.  Personal lifestyle choices, including:  Daily care of your teeth and gums.  Regular physical activity.  Eating a healthy diet.  Avoiding tobacco and drug use.  Limiting alcohol use.  Practicing safe sex.  Taking low-dose aspirin every day.  Taking vitamin and mineral supplements as recommended by your health care provider. What happens during an annual well check? The services and screenings done by your health care provider during your annual well check will depend on your age, overall health, lifestyle risk factors,  and family history of disease. Counseling  Your health care provider may ask you questions about your:  Alcohol use.  Tobacco use.  Drug use.  Emotional well-being.  Home and relationship well-being.  Sexual activity.  Eating habits.  History of falls.  Memory and ability to understand (cognition).  Work and work Statistician.  Reproductive health. Screening  You may have the following tests or measurements:  Height, weight, and BMI.  Blood pressure.  Lipid and cholesterol levels. These may be checked every 5 years, or more frequently if you are over 24 years old.  Skin check.  Lung cancer screening. You may have this screening every year starting at age 38 if you have a 30-pack-year history of smoking and currently smoke or have quit within the past 15 years.  Fecal occult blood test (FOBT) of the stool. You may have this test every year starting at age 39.  Flexible sigmoidoscopy or colonoscopy. You may have a sigmoidoscopy every 5 years or a colonoscopy every 10 years starting at age 16.  Hepatitis C blood test.  Hepatitis B blood test.  Sexually transmitted disease (STD) testing.  Diabetes screening. This is done by checking your blood sugar (glucose) after you have not eaten for a while (fasting). You may have this done every 1-3 years.  Bone density scan. This is done to screen for osteoporosis. You may have this done starting at age 67.  Mammogram. This may be done every 1-2 years. Talk to your health care provider about how often you should have regular mammograms. Talk with your health care provider about your test results, treatment options, and if necessary, the need for more tests. Vaccines  Your health care provider may recommend certain vaccines, such as:  Influenza vaccine. This is recommended every year.  Tetanus, diphtheria, and acellular pertussis (Tdap, Td) vaccine. You may need a Td booster every 10 years.  Zoster vaccine. You may need  this after age 33.  Pneumococcal 13-valent conjugate (PCV13) vaccine. One dose is recommended after age 32.  Pneumococcal polysaccharide (PPSV23) vaccine. One dose is recommended after age 31. Talk to your health care provider about which screenings and vaccines you need and how often you need them. This information is not intended to replace advice given to you by your health care provider. Make sure you discuss any questions you have with your health care provider. Document Released: 10/11/2015 Document Revised: 06/03/2016 Document Reviewed: 07/16/2015 Elsevier Interactive Patient Education  2017 [place] Prevention in the Home Falls can cause injuries. They can happen to people of all ages. There are many things you can do to make your home safe and to help prevent falls. What can I do on the outside of my home?  Regularly fix the edges of walkways and driveways and fix any cracks.  Remove anything that might make you trip as you walk through a door, such as a raised step or threshold.  Trim any bushes or trees on the path to your home.  Use bright outdoor lighting.  Clear any walking paths of anything that might make someone trip, such as rocks or tools.  Regularly check to see if handrails are loose or broken. Make sure that both sides of any steps have handrails.  Any raised decks and porches should have guardrails on the edges.  Have any leaves, snow, or ice cleared regularly.  Use sand or salt on walking paths during winter.  Clean up any spills in your garage right away. This includes oil or grease spills. What can I do in the bathroom?  Use night lights.  Install grab bars by the toilet and in the tub and shower. Do not use towel bars as grab bars.  Use non-skid mats or decals in the tub or shower.  If you need to sit down in the shower, use a plastic, non-slip stool.  Keep the floor dry. Clean up any water that spills on the floor as soon as it  happens.  Remove soap buildup in the tub or shower regularly.  Attach bath mats securely with double-sided non-slip rug tape.  Do not have throw rugs and other things on the floor that can make you trip. What can I do in the bedroom?  Use night lights.  Make sure that you have a light by your bed that is easy to reach.  Do not use any sheets or blankets that are too big for your bed. They should not hang down onto the floor.  Have a firm chair that has side arms. You can use this for support while you get dressed.  Do not have throw rugs and other things on the floor that can make you trip. What can I do in the kitchen?  Clean up any spills right away.  Avoid walking on wet floors.  Keep items that you use a lot in easy-to-reach places.  If you need to reach something above you, use a strong step stool that has a grab bar.  Keep electrical cords out of the way.  Do not use floor polish or wax that makes floors slippery. If you must use wax, use non-skid floor wax.  Do  not have throw rugs and other things on the floor that can make you trip. What can I do with my stairs?  Do not leave any items on the stairs.  Make sure that there are handrails on both sides of the stairs and use them. Fix handrails that are broken or loose. Make sure that handrails are as long as the stairways.  Check any carpeting to make sure that it is firmly attached to the stairs. Fix any carpet that is loose or worn.  Avoid having throw rugs at the top or bottom of the stairs. If you do have throw rugs, attach them to the floor with carpet tape.  Make sure that you have a light switch at the top of the stairs and the bottom of the stairs. If you do not have them, ask someone to add them for you. What else can I do to help prevent falls?  Wear shoes that:  Do not have high heels.  Have rubber bottoms.  Are comfortable and fit you well.  Are closed at the toe. Do not wear sandals.  If you  use a stepladder:  Make sure that it is fully opened. Do not climb a closed stepladder.  Make sure that both sides of the stepladder are locked into place.  Ask someone to hold it for you, if possible.  Clearly mark and make sure that you can see:  Any grab bars or handrails.  First and last steps.  Where the edge of each step is.  Use tools that help you move around (mobility aids) if they are needed. These include:  Canes.  Walkers.  Scooters.  Crutches.  Turn on the lights when you go into a dark area. Replace any light bulbs as soon as they burn out.  Set up your furniture so you have a clear path. Avoid moving your furniture around.  If any of your floors are uneven, fix them.  If there are any pets around you, be aware of where they are.  Review your medicines with your doctor. Some medicines can make you feel dizzy. This can increase your chance of falling. Ask your doctor what other things that you can do to help prevent falls. This information is not intended to replace advice given to you by your health care provider. Make sure you discuss any questions you have with your health care provider. Document Released: 07/11/2009 Document Revised: 02/20/2016 Document Reviewed: 10/19/2014 Elsevier Interactive Patient Education  2017 [employer].

## 2021-01-28 ENCOUNTER — Other Ambulatory Visit: Payer: Self-pay | Admitting: Physician Assistant

## 2021-01-28 DIAGNOSIS — N289 Disorder of kidney and ureter, unspecified: Secondary | ICD-10-CM

## 2021-01-28 LAB — COMPREHENSIVE METABOLIC PANEL
ALT: 34 U/L (ref 0–35)
AST: 32 U/L (ref 0–37)
Albumin: 4.7 g/dL (ref 3.5–5.2)
Alkaline Phosphatase: 73 U/L (ref 39–117)
BUN: 29 mg/dL — ABNORMAL HIGH (ref 6–23)
CO2: 23 mEq/L (ref 19–32)
Calcium: 10.8 mg/dL — ABNORMAL HIGH (ref 8.4–10.5)
Chloride: 106 mEq/L (ref 96–112)
Creatinine, Ser: 1.41 mg/dL — ABNORMAL HIGH (ref 0.40–1.20)
GFR: 34.7 mL/min — ABNORMAL LOW (ref >60.00)
Glucose, Bld: 98 mg/dL (ref 70–99)
Potassium: 4.9 mEq/L (ref 3.5–5.1)
Sodium: 140 mEq/L (ref 135–145)
Total Bilirubin: 0.5 mg/dL (ref 0.2–1.2)
Total Protein: 7.3 g/dL (ref 6.0–8.3)

## 2021-02-03 ENCOUNTER — Other Ambulatory Visit: Payer: Self-pay

## 2021-02-03 ENCOUNTER — Other Ambulatory Visit (INDEPENDENT_AMBULATORY_CARE_PROVIDER_SITE_OTHER): Payer: Medicare PPO

## 2021-02-03 DIAGNOSIS — N289 Disorder of kidney and ureter, unspecified: Secondary | ICD-10-CM

## 2021-02-03 LAB — COMPREHENSIVE METABOLIC PANEL
ALT: 26 U/L (ref 0–35)
AST: 23 U/L (ref 0–37)
Albumin: 4.4 g/dL (ref 3.5–5.2)
Alkaline Phosphatase: 73 U/L (ref 39–117)
BUN: 28 mg/dL — ABNORMAL HIGH (ref 6–23)
CO2: 24 mEq/L (ref 19–32)
Calcium: 10.4 mg/dL (ref 8.4–10.5)
Chloride: 106 mEq/L (ref 96–112)
Creatinine, Ser: 1.23 mg/dL — ABNORMAL HIGH (ref 0.40–1.20)
GFR: 40.88 mL/min — ABNORMAL LOW (ref >60.00)
Glucose, Bld: 103 mg/dL — ABNORMAL HIGH (ref 70–99)
Potassium: 4.2 mEq/L (ref 3.5–5.1)
Sodium: 138 mEq/L (ref 135–145)
Total Bilirubin: 0.4 mg/dL (ref 0.2–1.2)
Total Protein: 7 g/dL (ref 6.0–8.3)

## 2021-03-24 ENCOUNTER — Other Ambulatory Visit: Payer: Self-pay | Admitting: Physician Assistant

## 2021-05-30 DIAGNOSIS — H25813 Combined forms of age-related cataract, bilateral: Secondary | ICD-10-CM | POA: Diagnosis not present

## 2021-05-30 DIAGNOSIS — E119 Type 2 diabetes mellitus without complications: Secondary | ICD-10-CM | POA: Diagnosis not present

## 2021-06-05 ENCOUNTER — Ambulatory Visit: Payer: Medicare PPO | Admitting: Physician Assistant

## 2021-06-06 ENCOUNTER — Ambulatory Visit: Payer: Medicare PPO | Admitting: Physician Assistant

## 2021-06-09 ENCOUNTER — Other Ambulatory Visit: Payer: Self-pay

## 2021-06-09 ENCOUNTER — Ambulatory Visit: Payer: Medicare PPO | Admitting: Physician Assistant

## 2021-06-09 ENCOUNTER — Encounter: Payer: Self-pay | Admitting: Physician Assistant

## 2021-06-09 VITALS — BP 97/65 | HR 98 | Temp 98.2°F | Ht 66.5 in | Wt 223.6 lb

## 2021-06-09 DIAGNOSIS — N289 Disorder of kidney and ureter, unspecified: Secondary | ICD-10-CM

## 2021-06-09 DIAGNOSIS — I10 Essential (primary) hypertension: Secondary | ICD-10-CM | POA: Diagnosis not present

## 2021-06-09 LAB — BASIC METABOLIC PANEL
BUN: 26 mg/dL — ABNORMAL HIGH (ref 6–23)
CO2: 26 mEq/L (ref 19–32)
Calcium: 10.4 mg/dL (ref 8.4–10.5)
Chloride: 103 mEq/L (ref 96–112)
Creatinine, Ser: 1.47 mg/dL — ABNORMAL HIGH (ref 0.40–1.20)
GFR: 32.93 mL/min — ABNORMAL LOW (ref >60.00)
Glucose, Bld: 196 mg/dL — ABNORMAL HIGH (ref 70–99)
Potassium: 4.4 mEq/L (ref 3.5–5.1)
Sodium: 139 mEq/L (ref 135–145)

## 2021-06-09 MED ORDER — LOSARTAN POTASSIUM 25 MG PO TABS: 25.0000 mg | ORAL_TABLET | Freq: Every day | ORAL | 1 refills | Status: DC

## 2021-06-09 NOTE — Patient Instructions (Addendum)
It was great to see you!  I'm putting in a referral for [facility] If you have not heard anything from them by end of week -- pease call them [phone]  Decrease your losartan to 25 mg daily  Follow-up with me or the kidney doctors in 1 month to recheck blood pressure, sooner if you have concerns.  Take care,  Inda Coke PA-C

## 2021-06-09 NOTE — Progress Notes (Signed)
Marie Rhodes is a 85 y.o. female here for a follow up of a pre-existing problem.  History of Present Illness:   Chief Complaint  Patient presents with  . Hypertension    She denies Chest Pain, SOB, dizziness. She is requesting referral to Urologist from follow up labs from last visit.     HPI  HTN Currently taking losartan 50 mg. At home blood pressure readings are: not checked. Patient denies chest pain, SOB, blurred vision, dizziness, unusual headaches, lower leg swelling. Patient is compliant with medication. Denies excessive caffeine intake, stimulant usage, excessive alcohol intake, or increase in salt consumption.  BP Readings from Last 3 Encounters:  06/09/21 97/65  01/27/21 110/60  01/24/21 130/60   Wt Readings from Last 5 Encounters:  06/09/21 223 lb 9.6 oz (101.4 kg)  01/27/21 225 lb 8 oz (102.3 kg)  01/24/21 228 lb (103.4 kg)  10/25/20 239 lb 4 oz (108.5 kg)  10/25/20 239 lb (108.4 kg)    Renal insufficiency Continues to avoid nephrotoxic agents. At last lab draw she was recommended to consider renal referral due to CKD stage IIIb. She states that she was agreeable to to this but was unable to tell us via MyChart she wanted referral. Denies uremia symptoms or changes in urination.    Past Medical History:  Diagnosis Date  . ANXIETY 04/27/2007  . CROHN'S DISEASE, SMALL INTESTINE 06/08/2008   ileum, cecum  . DEPRESSION 04/27/2007  . Fatty tumor    left shoulder  . GOITER, MULTINODULAR 05/04/2008  . Hyperlipemia   . Molar pregnancy 1967  . OBESITY 08/01/2010  . UNSPECIFIED ANEMIA 05/04/2008   Mild Anemia  . Vitamin D deficiency 04/08/2018     Social History   Tobacco Use  . Smoking status: Former    Types: Cigarettes    Quit date: 05/29/1988    Years since quitting: 33.0  . Smokeless tobacco: Never  Substance Use Topics  . Alcohol use: No  . Drug use: No    Past Surgical History:  Procedure Laterality Date  . APPENDECTOMY    . BIOPSY THYROID    .  COLONOSCOPY  08/10/2006   normal  . LEFT OOPHORECTOMY  1992  . MECKEL DIVERTICULUM EXCISION     Ileal and cecal resection     Family History  Problem Relation Age of Onset  . Breast cancer Mother 29  . Kidney disease Mother        Dialysis  . Diabetes Mother   . Kidney cancer Mother        Renal Cell Carcinoma  . Skin cancer Father        mets  . Diabetes Father   . Melanoma Sister   . Diabetes Brother   . Tongue cancer Brother 54  . Stroke Maternal Grandmother   . Heart disease Paternal Grandmother   . Colon cancer Paternal Uncle     Allergies  Allergen Reactions  . Tylenol With Codeine #3 [Acetaminophen-Codeine] Other (See Comments)    Constipation    Current Medications:   Current Outpatient Medications:  .  ALPRAZolam (XANAX) 0.25 MG tablet, TAKE AS NEEDED FOR ANXIETY., Disp: 30 tablet, Rfl: 0 .  Calcium Carbonate (CALTRATE 600 PO), Take 1 tablet by mouth daily in the afternoon., Disp: , Rfl:  .  dapagliflozin propanediol (FARXIGA) 5 MG TABS tablet, Take 1 tablet (5 mg total) by mouth daily., Disp: 90 tablet, Rfl: 3 .  ferrous sulfate 325 (65 FE) MG tablet, Take 1 tablet (  325 mg total) by mouth 2 (two) times daily., Disp: , Rfl: 3 .  glucose blood (ONETOUCH VERIO) test strip, 1 each by Other route as needed for other. And lancets 1/day, Disp: 100 each, Rfl: 12 .  losartan (COZAAR) 25 MG tablet, Take 1 tablet (25 mg total) by mouth daily., Disp: 30 tablet, Rfl: 1 .  Multiple Vitamin (MULTIVITAMIN) tablet, Take 1 tablet by mouth at bedtime., Disp: , Rfl:  .  pravastatin (PRAVACHOL) 40 MG tablet, Take 1 tablet (40 mg total) by mouth daily., Disp: 90 tablet, Rfl: 3 .  repaglinide (PRANDIN) 1 MG tablet, Take 3 tablets (3 mg total) by mouth 2 (two) times daily before a meal., Disp: 540 tablet, Rfl: 3 .  vitamin B-12 (CYANOCOBALAMIN) 1000 MCG tablet, Take 1,000 mcg by mouth 2 (two) times daily. , Disp: , Rfl:  .  Vitamin D, Cholecalciferol, 25 MCG (1000 UT) CAPS, Take 1  capsule by mouth daily. d3, Disp: , Rfl:    Review of Systems:   ROS Negative unless otherwise specified per HPI.  Vitals:   Vitals:   06/09/21 1259  BP: 97/65  Pulse: 98  Temp: 98.2 F (36.8 C)  TempSrc: Temporal  SpO2: 95%  Weight: 223 lb 9.6 oz (101.4 kg)  Height: 5' 6.5" (1.689 m)     Body mass index is 35.55 kg/m.  Physical Exam:   Physical Exam Vitals and nursing note reviewed.  Constitutional:      General: She is not in acute distress.    Appearance: She is well-developed. She is not ill-appearing or toxic-appearing.  Cardiovascular:     Rate and Rhythm: Normal rate and regular rhythm.     Pulses: Normal pulses.     Heart sounds: Normal heart sounds, S1 normal and S2 normal.     Comments: No LE edema Pulmonary:     Effort: Pulmonary effort is normal.     Breath sounds: Normal breath sounds.  Skin:    General: Skin is warm and dry.  Neurological:     Mental Status: She is alert.     GCS: GCS eye subscore is 4. GCS verbal subscore is 5. GCS motor subscore is 6.  Psychiatric:        Speech: Speech normal.        Behavior: Behavior normal. Behavior is cooperative.    Assessment and Plan:   1. Essential hypertension Over controlled however she is asymptomatic Commended ongoing weight loss Decrease losartan 25 mg daily Follow-up in 1 month, sooner if concerns  2. Renal insufficiency Update BMP today Continue to avoid nephrotoxic agents Referral to [place], PA-C

## 2021-06-13 DIAGNOSIS — Z1231 Encounter for screening mammogram for malignant neoplasm of breast: Secondary | ICD-10-CM | POA: Diagnosis not present

## 2021-06-13 LAB — HM MAMMOGRAPHY

## 2021-06-18 ENCOUNTER — Encounter: Payer: Self-pay | Admitting: Physician Assistant

## 2021-07-11 ENCOUNTER — Other Ambulatory Visit: Payer: Self-pay

## 2021-07-11 ENCOUNTER — Encounter: Payer: Self-pay | Admitting: Physician Assistant

## 2021-07-11 ENCOUNTER — Ambulatory Visit: Payer: Medicare PPO | Admitting: Physician Assistant

## 2021-07-11 VITALS — BP 126/70 | HR 93 | Temp 98.2°F | Wt 222.8 lb

## 2021-07-11 DIAGNOSIS — Z23 Encounter for immunization: Secondary | ICD-10-CM | POA: Diagnosis not present

## 2021-07-11 DIAGNOSIS — I10 Essential (primary) hypertension: Secondary | ICD-10-CM | POA: Diagnosis not present

## 2021-07-11 NOTE — Progress Notes (Signed)
Marie Rhodes is a 85 y.o. female here for a follow up of a pre-existing problem.   History of Present Illness:   Chief Complaint  Patient presents with  . Hypertension    HPI  HTN Currently taking losartan 25 mg. At home blood pressure readings are: not checked. Patient denies chest pain, SOB, blurred vision, dizziness, unusual headaches, lower leg swelling. Patient is compliant with medication. Denies excessive caffeine intake, stimulant usage, excessive alcohol intake, or increase in salt consumption.  BP Readings from Last 3 Encounters:  07/11/21 126/70  06/09/21 97/65  01/27/21 110/60     Past Medical History:  Diagnosis Date  . ANXIETY 04/27/2007  . CROHN'S DISEASE, SMALL INTESTINE 06/08/2008   ileum, cecum  . DEPRESSION 04/27/2007  . Fatty tumor    left shoulder  . GOITER, MULTINODULAR 05/04/2008  . Hyperlipemia   . Molar pregnancy 1967  . OBESITY 08/01/2010  . UNSPECIFIED ANEMIA 05/04/2008   Mild Anemia  . Vitamin D deficiency 04/08/2018     Social History   Tobacco Use  . Smoking status: Former    Types: Cigarettes    Quit date: 05/29/1988    Years since quitting: 33.1  . Smokeless tobacco: Never  Substance Use Topics  . Alcohol use: No  . Drug use: No    Past Surgical History:  Procedure Laterality Date  . APPENDECTOMY    . BIOPSY THYROID    . COLONOSCOPY  08/10/2006   normal  . LEFT OOPHORECTOMY  1992  . MECKEL DIVERTICULUM EXCISION     Ileal and cecal resection     Family History  Problem Relation Age of Onset  . Breast cancer Mother 88  . Kidney disease Mother        Dialysis  . Diabetes Mother   . Kidney cancer Mother        Renal Cell Carcinoma  . Skin cancer Father        mets  . Diabetes Father   . Melanoma Sister   . Diabetes Brother   . Tongue cancer Brother 57  . Stroke Maternal Grandmother   . Heart disease Paternal Grandmother   . Colon cancer Paternal Uncle     Allergies  Allergen Reactions  . Tylenol With Codeine #3  [Acetaminophen-Codeine] Other (See Comments)    Constipation    Current Medications:   Current Outpatient Medications:  .  ALPRAZolam (XANAX) 0.25 MG tablet, TAKE AS NEEDED FOR ANXIETY., Disp: 30 tablet, Rfl: 0 .  Calcium Carbonate (CALTRATE 600 PO), Take 1 tablet by mouth daily in the afternoon., Disp: , Rfl:  .  dapagliflozin propanediol (FARXIGA) 5 MG TABS tablet, Take 1 tablet (5 mg total) by mouth daily., Disp: 90 tablet, Rfl: 3 .  ferrous sulfate 325 (65 FE) MG tablet, Take 1 tablet (325 mg total) by mouth 2 (two) times daily., Disp: , Rfl: 3 .  glucose blood (ONETOUCH VERIO) test strip, 1 each by Other route as needed for other. And lancets 1/day, Disp: 100 each, Rfl: 12 .  losartan (COZAAR) 25 MG tablet, Take 1 tablet (25 mg total) by mouth daily., Disp: 30 tablet, Rfl: 1 .  Multiple Vitamin (MULTIVITAMIN) tablet, Take 1 tablet by mouth at bedtime., Disp: , Rfl:  .  pravastatin (PRAVACHOL) 40 MG tablet, Take 1 tablet (40 mg total) by mouth daily., Disp: 90 tablet, Rfl: 3 .  repaglinide (PRANDIN) 1 MG tablet, Take 3 tablets (3 mg total) by mouth 2 (two) times daily before a  meal., Disp: 540 tablet, Rfl: 3 .  vitamin B-12 (CYANOCOBALAMIN) 1000 MCG tablet, Take 1,000 mcg by mouth 2 (two) times daily. , Disp: , Rfl:  .  Vitamin D, Cholecalciferol, 25 MCG (1000 UT) CAPS, Take 1 capsule by mouth daily. d3, Disp: , Rfl:    Review of Systems:   ROS Negative unless otherwise specified per HPI.  Vitals:   Vitals:   07/11/21 1444  BP: 126/70  Pulse: 93  Temp: 98.2 F (36.8 C)  TempSrc: Temporal  SpO2: 92%  Weight: 222 lb 12.8 oz (101.1 kg)     Body mass index is 35.42 kg/m.  Physical Exam:   Physical Exam Vitals and nursing note reviewed.  Constitutional:      General: She is not in acute distress.    Appearance: She is well-developed. She is not ill-appearing or toxic-appearing.  Cardiovascular:     Rate and Rhythm: Normal rate and regular rhythm.     Pulses: Normal  pulses.     Heart sounds: Normal heart sounds, S1 normal and S2 normal.  Pulmonary:     Effort: Pulmonary effort is normal.     Breath sounds: Normal breath sounds.  Skin:    General: Skin is warm and dry.  Neurological:     Mental Status: She is alert.     GCS: GCS eye subscore is 4. GCS verbal subscore is 5. GCS motor subscore is 6.  Psychiatric:        Speech: Speech normal.        Behavior: Behavior normal. Behavior is cooperative.    Assessment and Plan:   Essential hypertension Normotensive Continue losartan 25 mg daily Follow-up in 3 months, sooner if concerns Referral for nephrology pending at this time  Inda Coke, PA-C

## 2021-07-11 NOTE — Patient Instructions (Signed)
It was great to see you!  Continue losartan 25 mg daily.  Let's follow-up in 3 months for follow-up visit (you can cancel this if you get an appointment with the kidney doctor), sooner if you have concerns.  Take care,  Inda Coke PA-C

## 2021-07-28 ENCOUNTER — Other Ambulatory Visit: Payer: Self-pay

## 2021-07-28 ENCOUNTER — Ambulatory Visit: Payer: Medicare PPO | Admitting: Endocrinology

## 2021-07-28 VITALS — BP 122/56 | HR 102 | Ht 66.5 in | Wt 220.6 lb

## 2021-07-28 DIAGNOSIS — E119 Type 2 diabetes mellitus without complications: Secondary | ICD-10-CM

## 2021-07-28 LAB — POCT GLYCOSYLATED HEMOGLOBIN (HGB A1C): Hemoglobin A1C: 7 % — AB (ref 4.0–5.6)

## 2021-07-28 NOTE — Progress Notes (Signed)
Subjective:    Patient ID: Marie Rhodes, female    DOB: [birth year], 85 y.o.   MRN: [id number]  HPI Pt returns for f/u of diabetes mellitus:  DM type: 2    Dx'ed: 2725 Complications: stage 3a CRI.   Therapy: 2 oral meds.   GDM: never DKA: never Severe hypoglycemia: never Pancreatitis: never Other: she has never taken insulin; Crohn's is a relative contraindication to GLP meds.   Interval history: pt states she feels well in general.  She takes meds as rx'ed.  She says cbg's are in the 100's.  Past Medical History:  Diagnosis Date   ANXIETY 04/27/2007   CROHN'S DISEASE, SMALL INTESTINE 06/08/2008   ileum, cecum   DEPRESSION 04/27/2007   Fatty tumor    left shoulder   GOITER, MULTINODULAR 05/04/2008   Hyperlipemia    Molar pregnancy 1967   OBESITY 08/01/2010   UNSPECIFIED ANEMIA 05/04/2008   Mild Anemia   Vitamin D deficiency 04/08/2018    Past Surgical History:  Procedure Laterality Date   APPENDECTOMY     BIOPSY THYROID     COLONOSCOPY  08/10/2006   normal   LEFT OOPHORECTOMY  1992   MECKEL DIVERTICULUM EXCISION     Ileal and cecal resection     Social History   Socioeconomic History   Marital status: Widowed    Spouse name: Not on file   Number of children: Not on file   Years of education: Not on file   Highest education level: Not on file  Occupational History   Occupation: retired Pharmacist, hospital    Comment: retired Merchant navy officer)  Tobacco Use   Smoking status: Former    Types: Cigarettes    Quit date: 05/29/1988    Years since quitting: 33.1   Smokeless tobacco: Never  Substance and Sexual Activity   Alcohol use: No   Drug use: No   Sexual activity: Not on file  Other Topics Concern   Not on file  Social History Narrative   Patient is widowed --husband died from brain aneurysm, she is a retired Pharmacist, hospital (second grade) and currently works at an [employer] 2 days a week.   Former Research scientist (life sciences) (college only), no alcohol or drug use   Both of her grandchildren  lived with her while they went to college    As of November 2019, she lives alone   Social Determinants of Radio broadcast assistant Strain: Low Risk    Difficulty of Paying Living Expenses: Not hard at all  Food Insecurity: No Food Insecurity   Worried About Charity fundraiser in the Last Year: Never true   Arboriculturist in the Last Year: Never true  Transportation Needs: No Transportation Needs   Lack of Transportation (Medical): No   Lack of Transportation (Non-Medical): No  Physical Activity: Inactive   Days of Exercise per Week: 0 days   Minutes of Exercise per Session: 0 min  Stress: No Stress Concern Present   Feeling of Stress : Not at all  Social Connections: Moderately Isolated   Frequency of Communication with Friends and Family: Three times a week   Frequency of Social Gatherings with Friends and Family: Twice a week   Attends Religious Services: More than 4 times per year   Active Member of [employer] or Organizations: No   Attends Archivist Meetings: Never   Marital Status: Widowed  Intimate Partner Violence: Not At Risk   Fear of Current or Ex-Partner:  No   Emotionally Abused: No   Physically Abused: No   Sexually Abused: No    Current Outpatient Medications on File Prior to Visit  Medication Sig Dispense Refill   ALPRAZolam (XANAX) 0.25 MG tablet TAKE AS NEEDED FOR ANXIETY. 30 tablet 0   Calcium Carbonate (CALTRATE 600 PO) Take 1 tablet by mouth daily in the afternoon.     dapagliflozin propanediol (FARXIGA) 5 MG TABS tablet Take 1 tablet (5 mg total) by mouth daily. 90 tablet 3   ferrous sulfate 325 (65 FE) MG tablet Take 1 tablet (325 mg total) by mouth 2 (two) times daily.  3   glucose blood (ONETOUCH VERIO) test strip 1 each by Other route as needed for other. And lancets 1/day 100 each 12   losartan (COZAAR) 25 MG tablet Take 1 tablet (25 mg total) by mouth daily. 30 tablet 1   Multiple Vitamin (MULTIVITAMIN) tablet Take 1 tablet by mouth at  bedtime.     pravastatin (PRAVACHOL) 40 MG tablet Take 1 tablet (40 mg total) by mouth daily. 90 tablet 3   repaglinide (PRANDIN) 1 MG tablet Take 3 tablets (3 mg total) by mouth 2 (two) times daily before a meal. 540 tablet 3   vitamin B-12 (CYANOCOBALAMIN) 1000 MCG tablet Take 1,000 mcg by mouth 2 (two) times daily.      Vitamin D, Cholecalciferol, 25 MCG (1000 UT) CAPS Take 1 capsule by mouth daily. d3     No current facility-administered medications on file prior to visit.    Allergies  Allergen Reactions   Tylenol With Codeine #3 [Acetaminophen-Codeine] Other (See Comments)    Constipation    Family History  Problem Relation Age of Onset   Breast cancer Mother 35   Kidney disease Mother        Dialysis   Diabetes Mother    Kidney cancer Mother        Renal Cell Carcinoma   Skin cancer Father        mets   Diabetes Father    Melanoma Sister    Diabetes Brother    Tongue cancer Brother 11   Stroke Maternal Grandmother    Heart disease Paternal Grandmother    Colon cancer Paternal Uncle     BP (!) 122/56 (BP Location: Right Arm, Patient Position: Sitting, Cuff Size: Normal)   Pulse (!) 102   Ht 5' 6.5" (1.689 m)   Wt 220 lb 9.6 oz (100.1 kg)   SpO2 95%   BMI 35.07 kg/m    Review of Systems She has lost weight--intentional.      Objective:   Physical Exam Pulses: dorsalis pedis intact bilat.   MSK: no deformity of the feet CV: no leg edema Skin:  no ulcer on the feet.  normal color and temp on the feet. Neuro: sensation is intact to touch on the feet  A1c=7.0%  Lab Results  Component Value Date   CREATININE 1.47 (H) 06/09/2021   BUN 26 (H) 06/09/2021   NA 139 06/09/2021   K 4.4 06/09/2021   CL 103 06/09/2021   CO2 26 06/09/2021   Lab Results  Component Value Date   PTH 60 12/02/2020   CALCIUM 10.4 06/09/2021   CAION 1.27 12/06/2015   PHOS 2.9 11/24/2019       Assessment & Plan:  Type 2 DM: well-controlled.    Patient Instructions  check  your blood sugar twice a day.  vary the time of day when you check, between  before the 3 meals, and at bedtime.  also check if you have symptoms of your blood sugar being too high or too low.  please keep a record of the readings and bring it to your next appointment here (or you can bring the meter itself).  You can write it on any piece of paper.  please call us sooner if your blood sugar goes below 70, or if you have a lot of readings over 200.   Please continue the same 2 diabetes medications.   Please come back for a follow-up appointment in 6 months.

## 2021-07-28 NOTE — Patient Instructions (Signed)
check your blood sugar twice a day.  vary the time of day when you check, between before the 3 meals, and at bedtime.  also check if you have symptoms of your blood sugar being too high or too low.  please keep a record of the readings and bring it to your next appointment here (or you can bring the meter itself).  You can write it on any piece of paper.  please call us sooner if your blood sugar goes below 70, or if you have a lot of readings over 200.   Please continue the same 2 diabetes medications.   Please come back for a follow-up appointment in 6 months.

## 2021-08-04 ENCOUNTER — Telehealth: Payer: Self-pay | Admitting: Endocrinology

## 2021-08-04 NOTE — Telephone Encounter (Signed)
Pt would like to know what her last Pneumonia and tetanus vaccines were. Pt would like a Mychart msg. Thank you.

## 2021-08-06 ENCOUNTER — Other Ambulatory Visit: Payer: Self-pay | Admitting: Endocrinology

## 2021-08-11 DIAGNOSIS — E1122 Type 2 diabetes mellitus with diabetic chronic kidney disease: Secondary | ICD-10-CM | POA: Diagnosis not present

## 2021-08-11 DIAGNOSIS — N1832 Chronic kidney disease, stage 3b: Secondary | ICD-10-CM | POA: Diagnosis not present

## 2021-08-11 DIAGNOSIS — I129 Hypertensive chronic kidney disease with stage 1 through stage 4 chronic kidney disease, or unspecified chronic kidney disease: Secondary | ICD-10-CM | POA: Diagnosis not present

## 2021-08-12 ENCOUNTER — Other Ambulatory Visit: Payer: Self-pay | Admitting: Nephrology

## 2021-08-12 DIAGNOSIS — N1832 Chronic kidney disease, stage 3b: Secondary | ICD-10-CM

## 2021-08-14 ENCOUNTER — Ambulatory Visit
Admission: RE | Admit: 2021-08-14 | Discharge: 2021-08-14 | Disposition: A | Discharge status: Home / Self Care | Payer: Medicare PPO | Source: Ambulatory Visit | Attending: Nephrology | Admitting: Nephrology

## 2021-08-14 DIAGNOSIS — N200 Calculus of kidney: Secondary | ICD-10-CM | POA: Diagnosis not present

## 2021-08-14 DIAGNOSIS — N281 Cyst of kidney, acquired: Secondary | ICD-10-CM | POA: Diagnosis not present

## 2021-08-14 DIAGNOSIS — N1832 Chronic kidney disease, stage 3b: Secondary | ICD-10-CM

## 2021-08-29 ENCOUNTER — Other Ambulatory Visit: Payer: Self-pay | Admitting: Physician Assistant

## 2021-09-01 DIAGNOSIS — N1832 Chronic kidney disease, stage 3b: Secondary | ICD-10-CM | POA: Diagnosis not present

## 2021-10-20 ENCOUNTER — Telehealth: Payer: Self-pay | Admitting: Physician Assistant

## 2021-10-20 NOTE — Telephone Encounter (Signed)
Pt stopped in the office and is wanting to see if Marie Rhodes would be able to schedule a Nutrition appt with her. She is concerned with her health issues.

## 2021-10-20 NOTE — Telephone Encounter (Signed)
Please see message and advise 

## 2021-10-21 NOTE — Telephone Encounter (Signed)
Noted  

## 2021-10-21 NOTE — Telephone Encounter (Signed)
Left message on voicemail to call office.  

## 2021-10-21 NOTE — Telephone Encounter (Signed)
I advised patient regarding the Nutrition referral and she was thankful, however would like to try something with her kidney specialist. Marie Rhodes that in reading there are some things that help with her DM but interfere with her Kidneys. She was thankful for the call, and would keep [place] posted if that route does not work for her.

## 2021-10-28 DIAGNOSIS — J209 Acute bronchitis, unspecified: Secondary | ICD-10-CM | POA: Diagnosis not present

## 2021-10-28 DIAGNOSIS — H1033 Unspecified acute conjunctivitis, bilateral: Secondary | ICD-10-CM | POA: Diagnosis not present

## 2021-10-28 DIAGNOSIS — J019 Acute sinusitis, unspecified: Secondary | ICD-10-CM | POA: Diagnosis not present

## 2021-10-29 ENCOUNTER — Telehealth: Payer: Self-pay

## 2021-10-29 ENCOUNTER — Other Ambulatory Visit: Payer: Self-pay | Admitting: Physician Assistant

## 2021-10-29 MED ORDER — LOSARTAN POTASSIUM 25 MG PO TABS: 25.0000 mg | ORAL_TABLET | Freq: Every day | ORAL | 2 refills | Status: DC

## 2021-10-29 NOTE — Telephone Encounter (Signed)
MEDICATION: losartan (COZAAR) 25 MG tablet  PHARMACY:  [facility] [place] [address] Phone:  [phone]  Fax:  [phone]

## 2021-10-29 NOTE — Telephone Encounter (Signed)
Spoke to pt told her Rx for Losartan was sent to pharmacy. Pt verbalized understanding.

## 2021-11-05 DIAGNOSIS — J019 Acute sinusitis, unspecified: Secondary | ICD-10-CM | POA: Diagnosis not present

## 2021-11-11 ENCOUNTER — Telehealth: Payer: Self-pay | Admitting: *Deleted

## 2021-11-11 NOTE — Telephone Encounter (Signed)
Pt called back for Marie Rhodes- please call her at [phone].-

## 2021-11-11 NOTE — Telephone Encounter (Signed)
Left message on voicemail to call office. Need to speak to her about Mikle Bosworth.

## 2021-11-12 NOTE — Telephone Encounter (Signed)
See result notes. 

## 2021-12-22 DIAGNOSIS — Z85828 Personal history of other malignant neoplasm of skin: Secondary | ICD-10-CM | POA: Diagnosis not present

## 2021-12-22 DIAGNOSIS — D1801 Hemangioma of skin and subcutaneous tissue: Secondary | ICD-10-CM | POA: Diagnosis not present

## 2021-12-22 DIAGNOSIS — D485 Neoplasm of uncertain behavior of skin: Secondary | ICD-10-CM | POA: Diagnosis not present

## 2021-12-22 DIAGNOSIS — D225 Melanocytic nevi of trunk: Secondary | ICD-10-CM | POA: Diagnosis not present

## 2021-12-22 DIAGNOSIS — L738 Other specified follicular disorders: Secondary | ICD-10-CM | POA: Diagnosis not present

## 2021-12-22 DIAGNOSIS — L570 Actinic keratosis: Secondary | ICD-10-CM | POA: Diagnosis not present

## 2021-12-22 DIAGNOSIS — L821 Other seborrheic keratosis: Secondary | ICD-10-CM | POA: Diagnosis not present

## 2021-12-29 ENCOUNTER — Other Ambulatory Visit: Payer: Self-pay | Admitting: Endocrinology

## 2022-01-12 ENCOUNTER — Ambulatory Visit: Payer: Medicare PPO | Admitting: Physician Assistant

## 2022-01-12 ENCOUNTER — Encounter: Payer: Self-pay | Admitting: Physician Assistant

## 2022-01-12 VITALS — HR 97 | Temp 98.0°F | Ht 66.5 in | Wt 212.6 lb

## 2022-01-12 DIAGNOSIS — E119 Type 2 diabetes mellitus without complications: Secondary | ICD-10-CM

## 2022-01-12 DIAGNOSIS — E7800 Pure hypercholesterolemia, unspecified: Secondary | ICD-10-CM

## 2022-01-12 DIAGNOSIS — N1832 Chronic kidney disease, stage 3b: Secondary | ICD-10-CM

## 2022-01-12 LAB — POCT GLYCOSYLATED HEMOGLOBIN (HGB A1C): Hemoglobin A1C: 7.5 % — AB (ref 4.0–5.6)

## 2022-01-12 NOTE — Progress Notes (Signed)
Marie Rhodes is a 86 y.o. female here for a follow up of HTN. ? ?History of Present Illness:  ? ?Chief Complaint  ?Patient presents with  ? Follow-up  ?  Pt here for 6 mon f/u; wants to discuss kidney's and treatment vs side effects;   ? ? ?HPI ? ?Diabetes ?6 month follow-up. Current DM meds: farxiga 5 mg daily and prandin 3 mg twice daily . Blood sugars at home are: not checked.  Patient is compliant with medications. In regards to her A1c, she does admit she hasn't been doing as well due to her grandson finalizing his divorce and moving in with her. Between the moving and helping him and his 6 month old get settled, she has not been eating as well like she usually does. Denies: hypoglycemic or hyperglycemic episodes or symptoms.  ? ?Chronic Renal Impairment ?Since our previous visit, pt has followed up with Dr. Royce Macadamia, nephrology. After further lab work, pt was found to have moved to stage 3b chronic kidney disease. States that since finding this out, she began talking to her relative who is stage 4 and was told by them that once they really adjusted their diet to kidney healthy items, their function improved. Upon researching this further, Marie Rhodes has found that most of the foods that are kidney friendly, are not diabetic friendly. At this time she is interested in what she could do to improve this.  ? ? ?Lab Results  ?Component Value Date  ? HGBA1C 7.5 (A) 01/12/2022  ? ?HLD  ?[name] is currently compliant with taking pravastatin 40 mg daily with no complications. She is managing well but would like to re-check these levels. Denies CP or SOB.  ? ?Past Medical History:  ?Diagnosis Date  ? ANXIETY 04/27/2007  ? CROHN'S DISEASE, SMALL INTESTINE 06/08/2008  ? ileum, cecum  ? DEPRESSION 04/27/2007  ? Fatty tumor   ? left shoulder  ? GOITER, MULTINODULAR 05/04/2008  ? Hyperlipemia   ? Molar pregnancy 1967  ? OBESITY 08/01/2010  ? UNSPECIFIED ANEMIA 05/04/2008  ? Mild Anemia  ? Vitamin D deficiency 04/08/2018  ? ?  ?Social  History  ? ?Tobacco Use  ? Smoking status: Former  ?  Types: Cigarettes  ?  Quit date: 05/29/1988  ?  Years since quitting: 33.6  ? Smokeless tobacco: Never  ?Substance Use Topics  ? Alcohol use: No  ? Drug use: No  ? ? ?Past Surgical History:  ?Procedure Laterality Date  ? APPENDECTOMY    ? BIOPSY THYROID    ? COLONOSCOPY  08/10/2006  ? normal  ? LEFT OOPHORECTOMY  1992  ? MECKEL DIVERTICULUM EXCISION    ? Ileal and cecal resection   ? ? ?Family History  ?Problem Relation Age of Onset  ? Breast cancer Mother 71  ? Kidney disease Mother   ?     Dialysis  ? Diabetes Mother   ? Kidney cancer Mother   ?     Renal Cell Carcinoma  ? Skin cancer Father   ?     mets  ? Diabetes Father   ? Melanoma Sister   ? Diabetes Brother   ? Tongue cancer Brother 84  ? Stroke Maternal Grandmother   ? Heart disease Paternal Grandmother   ? Colon cancer Paternal Uncle   ? ? ?Allergies  ?Allergen Reactions  ? Tylenol With Codeine #3 [Acetaminophen-Codeine] Other (See Comments)  ?  Constipation  ? ? ?Current Medications:  ? ?Current Outpatient Medications:  ?  ALPRAZolam (XANAX) 0.25 MG tablet, TAKE AS NEEDED FOR ANXIETY., Disp: 30 tablet, Rfl: 0 ?  FARXIGA 5 MG TABS tablet, Take 1 tablet (5 mg total) by mouth daily., Disp: 90 tablet, Rfl: 3 ?  ferrous sulfate 325 (65 FE) MG tablet, Take 1 tablet (325 mg total) by mouth 2 (two) times daily., Disp: , Rfl: 3 ?  glucose blood (ONETOUCH VERIO) test strip, 1 each by Other route as needed for other. And lancets 1/day, Disp: 100 each, Rfl: 12 ?  losartan (COZAAR) 25 MG tablet, Take 1 tablet (25 mg total) by mouth daily., Disp: 30 tablet, Rfl: 2 ?  Multiple Vitamin (MULTIVITAMIN) tablet, Take 1 tablet by mouth at bedtime., Disp: , Rfl:  ?  pravastatin (PRAVACHOL) 40 MG tablet, Take 1 tablet (40 mg total) by mouth daily., Disp: 90 tablet, Rfl: 3 ?  repaglinide (PRANDIN) 1 MG tablet, TAKE 3 TABLETS TWICE DAILY BEFORE A MEAL, Disp: 540 tablet, Rfl: 3 ?  vitamin B-12 (CYANOCOBALAMIN) 1000 MCG tablet,  Take 1,000 mcg by mouth 2 (two) times daily. , Disp: , Rfl:  ?  Vitamin D, Cholecalciferol, 25 MCG (1000 UT) CAPS, Take 1 capsule by mouth daily. d3, Disp: , Rfl:  ?  Calcium Carbonate (CALTRATE 600 PO), Take 1 tablet by mouth daily in the afternoon. (Patient not taking: Reported on 01/12/2022), Disp: , Rfl:   ? ?Review of Systems:  ? ?ROS ?Negative unless otherwise specified per HPI. ?Vitals:  ? ?Vitals:  ? 01/12/22 1506  ?Pulse: 97  ?Temp: 98 ?F (36.7 ?C)  ?TempSrc: Temporal  ?SpO2: 94%  ?Weight: 212 lb 9.6 oz (96.4 kg)  ?Height: 5' 6.5" (1.689 m)  ?   ?Body mass index is 33.8 kg/m?. ? ?Physical Exam:  ? ?Physical Exam ?Vitals and nursing note reviewed.  ?Constitutional:   ?   General: She is not in acute distress. ?   Appearance: She is well-developed. She is not ill-appearing or toxic-appearing.  ?Cardiovascular:  ?   Rate and Rhythm: Normal rate and regular rhythm.  ?   Pulses: Normal pulses.  ?   Heart sounds: Normal heart sounds, S1 normal and S2 normal.  ?Pulmonary:  ?   Effort: Pulmonary effort is normal.  ?   Breath sounds: Normal breath sounds.  ?Skin: ?   General: Skin is warm and dry.  ?Neurological:  ?   Mental Status: She is alert.  ?   GCS: GCS eye subscore is 4. GCS verbal subscore is 5. GCS motor subscore is 6.  ?Psychiatric:     ?   Speech: Speech normal.     ?   Behavior: Behavior normal. Behavior is cooperative.  ? ? ?Assessment and Plan:  ? ?Type 2 diabetes mellitus without complication, without long-term current use of insulin ([name]) ?HgbA1c increased from 7.0% to 7.5%  ?Continue farxiga 5 mg daily and prandin 3 mg twice daily -- she sees Dr Loanne Drilling and is going to see him next week for further management; she is unsure if she will continue care with him since he is leaving [facility] ?Encouraged patient to continue participating in healthy eating and regular exercise as able ? ?Chronic renal impairment, stage 3b (HCC) ?Update labs, will make recommendations accordingly  ?Provided patient with  healthy eating options  ?Continue to see Renal ? ?HYPERCHOLESTEROLEMIA ?Update lipid panel within 1-2 weeks, will adjust medication as indicated by results   ?Continue pravastatin 40 mg daily ? ?I,Havlyn C Ratchford,acting as a Education administrator for [employer], PA.,have documented  all relevant documentation on the behalf of Inda Coke, PA,as directed by  Inda Coke, PA while in the presence of Inda Coke, [place]. ? ?IInda Coke, PA, have reviewed all documentation for this visit. The documentation on 01/12/22 for the exam, diagnosis, procedures, and orders are all accurate and complete. ? ? ?Inda Coke, PA-C ? ?

## 2022-01-12 NOTE — Patient Instructions (Signed)
It was great to see you! ? ?Please make an appointment with the lab on your way out. I would like for you to return for lab work within 1-2 weeks. After midnight on the day of the lab draw, please do not eat anything. You may have water, black coffee, unsweetened tea. ? ?Take care, ? ?Inda Coke PA-C  ?

## 2022-01-16 ENCOUNTER — Other Ambulatory Visit: Payer: Self-pay | Admitting: Physician Assistant

## 2022-01-16 ENCOUNTER — Other Ambulatory Visit (INDEPENDENT_AMBULATORY_CARE_PROVIDER_SITE_OTHER): Payer: Medicare PPO

## 2022-01-16 ENCOUNTER — Other Ambulatory Visit: Payer: Medicare PPO

## 2022-01-16 DIAGNOSIS — E7800 Pure hypercholesterolemia, unspecified: Secondary | ICD-10-CM | POA: Diagnosis not present

## 2022-01-16 LAB — LIPID PANEL
Cholesterol: 119 mg/dL (ref 0–200)
HDL: 47.4 mg/dL (ref >39.00)
LDL Cholesterol: 38 mg/dL (ref 0–99)
NonHDL: 72.02
Total CHOL/HDL Ratio: 3
Triglycerides: 171 mg/dL — ABNORMAL HIGH (ref 0.0–149.0)
VLDL: 34.2 mg/dL (ref 0.0–40.0)

## 2022-01-16 MED ORDER — PRAVASTATIN SODIUM 20 MG PO TABS: 20.0000 mg | ORAL_TABLET | Freq: Every day | ORAL | 1 refills | Status: DC

## 2022-01-27 ENCOUNTER — Ambulatory Visit: Payer: Medicare PPO | Admitting: Endocrinology

## 2022-01-27 ENCOUNTER — Encounter: Payer: Self-pay | Admitting: Endocrinology

## 2022-01-27 VITALS — BP 134/80 | HR 99 | Ht 66.5 in | Wt 212.0 lb

## 2022-01-27 DIAGNOSIS — E119 Type 2 diabetes mellitus without complications: Secondary | ICD-10-CM

## 2022-01-27 NOTE — Progress Notes (Signed)
? ?Subjective:  ? ? Patient ID: Marie Rhodes, female    DOB: 1936/07/18, 86 y.o.   MRN: [id number] ? ?HPI ?Pt returns for f/u of diabetes mellitus:  ?DM type: 2    ?Dx'ed: 2008 ?Complications: stage 3a CRI.   ?Therapy: 2 oral meds.   ?GDM: never ?DKA: never ?Severe hypoglycemia: never ?Pancreatitis: never ?Other: she has never taken insulin; Crohn's is a relative contraindication to GLP meds.   ?Interval history: She takes meds as rx'ed.  She says cbg's are in the 100's.  She last had steroids 2 mos ago, for sinusitis.   ?Past Medical History:  ?Diagnosis Date  ? ANXIETY 04/27/2007  ? CROHN'S DISEASE, SMALL INTESTINE 06/08/2008  ? ileum, cecum  ? DEPRESSION 04/27/2007  ? Fatty tumor   ? left shoulder  ? GOITER, MULTINODULAR 05/04/2008  ? Hyperlipemia   ? Molar pregnancy 1967  ? OBESITY 08/01/2010  ? UNSPECIFIED ANEMIA 05/04/2008  ? Mild Anemia  ? Vitamin D deficiency 04/08/2018  ? ? ?Past Surgical History:  ?Procedure Laterality Date  ? APPENDECTOMY    ? BIOPSY THYROID    ? COLONOSCOPY  08/10/2006  ? normal  ? LEFT OOPHORECTOMY  1992  ? MECKEL DIVERTICULUM EXCISION    ? Ileal and cecal resection   ? ? ?Social History  ? ?Socioeconomic History  ? Marital status: Widowed  ?  Spouse name: Not on file  ? Number of children: Not on file  ? Years of education: Not on file  ? Highest education level: Not on file  ?Occupational History  ? Occupation: retired Pharmacist, hospital  ?  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 Smoking status: Former  ?  Types: Cigarettes  ?  Quit date: 05/29/1988  ?  Years since quitting: 33.6  ? Smokeless tobacco: Never  ?Substance and Sexual Activity  ? Alcohol use: No  ? Drug use: No  ? Sexual activity: Not on file  ?Other Topics Concern  ? Not on file  ?Social History Narrative  ? Patient is widowed --husband died from brain aneurysm, she is a retired Pharmacist, hospital (second grade) and currently works at an [employer] 2 days a week.  ? Former Smoker (college only), no alcohol or drug use  ? Both of her  grandchildren lived with her while they went to college   ? As of November 2019, she lives alone  ? ?Social Determinants of Health  ? ?Financial Resource Strain: Low Risk   ? Difficulty of Paying Living Expenses: Not hard at all  ?Food Insecurity: No Food Insecurity  ? Worried About Charity fundraiser in the Last Year: Never true  ? Ran Out of Food in the Last Year: Never true  ?Transportation Needs: No Transportation Needs  ? Lack of Transportation (Medical): No  ? Lack of Transportation (Non-Medical): No  ?Physical Activity: Inactive  ? Days of Exercise per Week: 0 days  ? Minutes of Exercise per Session: 0 min  ?Stress: No Stress Concern Present  ? Feeling of Stress : Not at all  ?Social Connections: Moderately Isolated  ? Frequency of Communication with Friends and Family: Three times a week  ? Frequency of Social Gatherings with Friends and Family: Twice a week  ? Attends Religious Services: More than 4 times per year  ? Active Member of Clubs or Organizations: No  ? Attends Archivist Meetings: Never  ? Marital Status: Widowed  ?Intimate Partner Violence: Not At Risk  ? Fear of  Current or Ex-Partner: No  ? Emotionally Abused: No  ? Physically Abused: No  ? Sexually Abused: No  ? ? ?Current Outpatient Medications on File Prior to Visit  ?Medication Sig Dispense Refill  ? ALPRAZolam (XANAX) 0.25 MG tablet TAKE AS NEEDED FOR ANXIETY. 30 tablet 0  ? FARXIGA 5 MG TABS tablet Take 1 tablet (5 mg total) by mouth daily. 90 tablet 3  ? ferrous sulfate 325 (65 FE) MG tablet Take 1 tablet (325 mg total) by mouth 2 (two) times daily.  3  ? glucose blood (ONETOUCH VERIO) test strip 1 each by Other route as needed for other. And lancets 1/day 100 each 12  ? losartan (COZAAR) 25 MG tablet Take 1 tablet (25 mg total) by mouth daily. 30 tablet 2  ? Multiple Vitamin (MULTIVITAMIN) tablet Take 1 tablet by mouth at bedtime.    ? pravastatin (PRAVACHOL) 20 MG tablet Take 1 tablet (20 mg total) by mouth daily. 90 tablet  1  ? repaglinide (PRANDIN) 1 MG tablet TAKE 3 TABLETS TWICE DAILY BEFORE A MEAL 540 tablet 3  ? vitamin B-12 (CYANOCOBALAMIN) 1000 MCG tablet Take 1,000 mcg by mouth 2 (two) times daily.     ? Vitamin D, Cholecalciferol, 25 MCG (1000 UT) CAPS Take 1 capsule by mouth daily. d3    ? ?No current facility-administered medications on file prior to visit.  ? ? ?Allergies  ?Allergen Reactions  ? Tylenol With Codeine #3 [Acetaminophen-Codeine] Other (See Comments)  ?  Constipation  ? ? ?Family History  ?Problem Relation Age of Onset  ? Breast cancer Mother 35  ? Kidney disease Mother   ?     Dialysis  ? Diabetes Mother   ? Kidney cancer Mother   ?     Renal Cell Carcinoma  ? Skin cancer Father   ?     mets  ? Diabetes Father   ? Melanoma Sister   ? Diabetes Brother   ? Tongue cancer Brother 67  ? Stroke Maternal Grandmother   ? Heart disease Paternal Grandmother   ? Colon cancer Paternal Uncle   ? ? ?BP 134/80 (BP Location: Left Arm, Patient Position: Sitting, Cuff Size: Normal)   Pulse 99   Ht 5' 6.5" (1.689 m)   Wt 212 lb (96.2 kg)   SpO2 94%   BMI 33.71 kg/m?  ? ? ?Review of Systems ? ?   ?Objective:  ? Physical Exam ?VITAL SIGNS:  See vs page ?GENERAL: no distress ? ?Lab Results  ?Component Value Date  ? CREATININE 1.47 (H) 06/09/2021  ? BUN 26 (H) 06/09/2021  ? NA 139 06/09/2021  ? K 4.4 06/09/2021  ? CL 103 06/09/2021  ? CO2 26 06/09/2021  ? ? ?Lab Results  ?Component Value Date  ? HGBA1C 7.5 (A) 01/12/2022  ? ? ?   ?Assessment & Plan:  ?Type 2 DM: ?Elev glucose, due to steroid rx.   ? ?Patient Instructions  ?check your blood sugar twice a day.  vary the time of day when you check, between before the 3 meals, and at bedtime.  also check if you have symptoms of your blood sugar being too high or too low.  please keep a record of the readings and bring it to your next appointment here (or you can bring the meter itself).  You can write it on any piece of paper.  please call us sooner if your blood sugar goes below  70, or if you have a lot  of readings over 200.   ?Please continue the same 2 diabetes medications.   ?You should have an endocrinology follow-up appointment in 6 months.   ? ? ?

## 2022-01-27 NOTE — Patient Instructions (Addendum)
check your blood sugar twice a day.  vary the time of day when you check, between before the 3 meals, and at bedtime.  also check if you have symptoms of your blood sugar being too high or too low.  please keep a record of the readings and bring it to your next appointment here (or you can bring the meter itself).  You can write it on any piece of paper.  please call us sooner if your blood sugar goes below 70, or if you have a lot of readings over 200.   ?Please continue the same 2 diabetes medications.   ?You should have an endocrinology follow-up appointment in 6 months.   ?

## 2022-01-28 DIAGNOSIS — H1033 Unspecified acute conjunctivitis, bilateral: Secondary | ICD-10-CM | POA: Diagnosis not present

## 2022-02-02 ENCOUNTER — Other Ambulatory Visit: Payer: Self-pay | Admitting: Physician Assistant

## 2022-02-02 ENCOUNTER — Ambulatory Visit (INDEPENDENT_AMBULATORY_CARE_PROVIDER_SITE_OTHER): Payer: Medicare PPO

## 2022-02-02 VITALS — BP 130/72 | HR 92 | Temp 98.0°F | Wt 210.0 lb

## 2022-02-02 DIAGNOSIS — Z0000 Encounter for general adult medical examination without abnormal findings: Secondary | ICD-10-CM | POA: Diagnosis not present

## 2022-02-02 NOTE — Progress Notes (Signed)
? ?Subjective:  ? Marie Rhodes is a 86 y.o. female who presents for Medicare Annual (Subsequent) preventive examination. ? ?Review of Systems    ? ?Cardiac Risk Factors include: advanced age (>62mn, >>74women);diabetes mellitus;obesity (BMI >30kg/m2) ? ?   ?Objective:  ?  ?Today's Vitals  ? 02/02/22 1516  ?BP: 130/72  ?Pulse: 92  ?Temp: 98 ?F (36.7 ?C)  ?SpO2: 92%  ?Weight: 210 lb (95.3 kg)  ? ?Body mass index is 33.39 kg/m?. ? ? ?  02/02/2022  ?  3:22 PM 01/27/2021  ?  3:27 PM 10/19/2019  ? 10:38 AM 03/26/2018  ?  1:01 PM 10/08/2017  ?  2:58 PM 10/05/2016  ?  3:14 PM 01/28/2016  ?  3:13 PM  ?Advanced Directives  ?Does Patient Have a Medical Advance Directive? Yes Yes No No No No No  ?Type of Advance Directive Healthcare Power of Attorney        ?Does patient want to make changes to medical advance directive?  Yes (MAU/Ambulatory/Procedural Areas - Information given)       ?[place]? Yes - validated most recent copy scanned in chart (See row information)        ?Would patient like information on creating a medical advance directive?   Yes (MAU/Ambulatory/Procedural Areas - Information given)      ? ? ?Current Medications (verified) ?Outpatient Encounter Medications as of 02/02/2022  ?Medication Sig  ? ALPRAZolam (XANAX) 0.25 MG tablet TAKE AS NEEDED FOR ANXIETY.  ? FARXIGA 5 MG TABS tablet Take 1 tablet (5 mg total) by mouth daily.  ? ferrous sulfate 325 (65 FE) MG tablet Take 1 tablet (325 mg total) by mouth 2 (two) times daily.  ? glucose blood (ONETOUCH VERIO) test strip 1 each by Other route as needed for other. And lancets 1/day  ? losartan (COZAAR) 25 MG tablet TAKE ONE TABLET BY MOUTH DAILY  ? Multiple Vitamin (MULTIVITAMIN) tablet Take 1 tablet by mouth at bedtime.  ? pravastatin (PRAVACHOL) 20 MG tablet Take 1 tablet (20 mg total) by mouth daily.  ? repaglinide (PRANDIN) 1 MG tablet TAKE 3 TABLETS TWICE DAILY BEFORE A MEAL  ? vitamin B-12 (CYANOCOBALAMIN) 1000 MCG tablet Take  1,000 mcg by mouth 2 (two) times daily.   ? Vitamin D, Cholecalciferol, 25 MCG (1000 UT) CAPS Take 1 capsule by mouth daily. d3  ? ?No facility-administered encounter medications on file as of 02/02/2022.  ? ? ?Allergies (verified) ?Tylenol with codeine #3 [acetaminophen-codeine]  ? ?History: ?Past Medical History:  ?Diagnosis Date  ? ANXIETY 04/27/2007  ? CROHN'S DISEASE, SMALL INTESTINE 06/08/2008  ? ileum, cecum  ? DEPRESSION 04/27/2007  ? Fatty tumor   ? left shoulder  ? GOITER, MULTINODULAR 05/04/2008  ? Hyperlipemia   ? Molar pregnancy 1967  ? OBESITY 08/01/2010  ? UNSPECIFIED ANEMIA 05/04/2008  ? Mild Anemia  ? Vitamin D deficiency 04/08/2018  ? ?Past Surgical History:  ?Procedure Laterality Date  ? APPENDECTOMY    ? BIOPSY THYROID    ? COLONOSCOPY  08/10/2006  ? normal  ? LEFT OOPHORECTOMY  1992  ? MECKEL DIVERTICULUM EXCISION    ? Ileal and cecal resection   ? ?Family History  ?Problem Relation Age of Onset  ? Breast cancer Mother 767 ? Kidney disease Mother   ?     Dialysis  ? Diabetes Mother   ? Kidney cancer Mother   ?     Renal Cell Carcinoma  ?  Skin cancer Father   ?     mets  ? Diabetes Father   ? Melanoma Sister   ? Diabetes Brother   ? Tongue cancer Brother 75  ? Stroke Maternal Grandmother   ? Heart disease Paternal Grandmother   ? Colon cancer Paternal Uncle   ? ?Social History  ? ?Socioeconomic History  ? Marital status: Widowed  ?  Spouse name: Not on file  ? Number of children: Not on file  ? Years of education: Not on file  ? Highest education level: Not on file  ?Occupational History  ? Occupation: retired Pharmacist, hospital  ?  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 Smoking status: Former  ?  Types: Cigarettes  ?  Quit date: 05/29/1988  ?  Years since quitting: 33.7  ? Smokeless tobacco: Never  ?Substance and Sexual Activity  ? Alcohol use: No  ? Drug use: No  ? Sexual activity: Not on file  ?Other Topics Concern  ? Not on file  ?Social History Narrative  ? Patient is widowed --husband died from brain  aneurysm, she is a retired Pharmacist, hospital (second grade) and currently works at an [employer] 2 days a week.  ? Former Smoker (college only), no alcohol or drug use  ? Both of her grandchildren lived with her while they went to college   ? As of November 2019, she lives alone  ? ?Social Determinants of Health  ? ?Financial Resource Strain: Low Risk   ? Difficulty of Paying Living Expenses: Not hard at all  ?Food Insecurity: No Food Insecurity  ? Worried About Charity fundraiser in the Last Year: Never true  ? Ran Out of Food in the Last Year: Never true  ?Transportation Needs: No Transportation Needs  ? Lack of Transportation (Medical): No  ? Lack of Transportation (Non-Medical): No  ?Physical Activity: Inactive  ? Days of Exercise per Week: 0 days  ? Minutes of Exercise per Session: 0 min  ?Stress: No Stress Concern Present  ? Feeling of Stress : Not at all  ?Social Connections: Moderately Isolated  ? Frequency of Communication with Friends and Family: More than three times a week  ? Frequency of Social Gatherings with Friends and Family: More than three times a week  ? Attends Religious Services: More than 4 times per year  ? Active Member of Clubs or Organizations: No  ? Attends Archivist Meetings: Never  ? Marital Status: Widowed  ? ? ?Tobacco Counseling ?Counseling given: Not Answered ? ? ?Clinical Intake: ? ?Pre-visit preparation completed: Yes ? ?Pain : No/denies pain ? ?  ? ?BMI - recorded: 33.39 ?Nutritional Status: BMI > 30  Obese ?Nutritional Risks: None ?Diabetes: Yes ?CBG done?: No ?Did pt. bring in CBG monitor from home?: No ? ?How often do you need to have someone help you when you read instructions, pamphlets, or other written materials from your doctor or pharmacy?: 1 - Never ? ?Diabetic?Nutrition Risk Assessment: ? ?Has the patient had any N/V/D within the last 2 months?  No  ?Does the patient have any non-healing wounds?  No  ?Has the patient had any unintentional weight loss or  weight gain?  No  ? ?Diabetes: ? ?Is the patient diabetic?  Yes  ?If diabetic, was a CBG obtained today?  No  ?Did the patient bring in their glucometer from home?  No  ?How often do you monitor your CBG's? As needed .  ? ?Financial Strains and Diabetes  Management: ? ?Are you having any financial strains with the device, your supplies or your medication? No .  ?Does the patient want to be seen by Chronic Care Management for management of their diabetes?  No  ?Would the patient like to be referred to a Nutritionist or for Diabetic Management?  No  ? ?Diabetic Exams: ? ?Diabetic Eye Exam: Completed 05/30/21 ?Diabetic Foot Exam: Completed 07/28/21 ? ? ?Interpreter Needed?: No ? ?Information entered by :: Charlott Rakes, LPN ? ? ?Activities of Daily Living ? ?  02/02/2022  ?  3:24 PM  ?In your present state of health, do you have any difficulty performing the following activities:  ?Hearing? 0  ?Vision? 0  ?Difficulty concentrating or making decisions? 0  ?Walking or climbing stairs? 0  ?Dressing or bathing? 0  ?Doing errands, shopping? 0  ?Preparing Food and eating ? N  ?Using the Toilet? N  ?In the past six months, have you accidently leaked urine? N  ?Do you have problems with loss of bowel control? N  ?Managing your Medications? N  ?Managing your Finances? N  ?Housekeeping or managing your Housekeeping? N  ? ? ?Patient Care Team: ?Inda Coke, [place] as PCP - General (Physician Assistant) ?Gatha Mayer, MD as Attending Physician (Gastroenterology) ?Lorelle Gibbs, MD (Radiology) ?Rolm Bookbinder, MD as Attending Physician (Dermatology) ?Renato Shin, MD as Consulting Physician (Endocrinology) ?Drake Leach, [place] as Consulting Physician (Optometry) ? ?Indicate any recent Medical Services you may have received from other than [facility] providers in the past year (date may be approximate). ? ?   ?Assessment:  ? This is a routine wellness examination for Marie Rhodes. ? ?Hearing/Vision screen ?Hearing Screening - Comments::  Pt has slight  hearing loss  ?Vision Screening - Comments:: Pt follows up with [facility] [facility] [place] provider for annual eye exams  ? ?Dietary issues and exercise activities discussed: ?Current Exercise Habit

## 2022-02-02 NOTE — Patient Instructions (Signed)
Marie Rhodes , ?Thank you for taking time to come for your Medicare Wellness Visit. I appreciate your ongoing commitment to your health goals. Please review the following plan we discussed and let me know if I can assist you in the future.  ? ?Screening recommendations/referrals: ?Colonoscopy: No longer required  ?Mammogram: Done 06/13/21 repeat every year  ?Bone Density: Done 11/01/20 repeat every 2 years  ?Recommended yearly ophthalmology/optometry visit for glaucoma screening and checkup ?Recommended yearly dental visit for hygiene and checkup ? ?Vaccinations: ?Influenza vaccine: Done 07/11/21 repeat every year  ?Pneumococcal vaccine: Up to date ?Tdap vaccine: Done 10/10/17 repeat every 10 years  ?Shingles vaccine: Shingrix discussed. Please contact your pharmacy for coverage information.    ?Covid-19:declined  ? ?Advanced directives: Copies in chart  ? ?Conditions/risks identified:  continue to  lose weight  ? ?Next appointment: Follow up in one year for your annual wellness visit  ? ? ?Preventive Care 26 Years and Older, Female ?Preventive care refers to lifestyle choices and visits with your health care provider that can promote health and wellness. ?What does preventive care include? ?A yearly physical exam. This is also called an annual well check. ?Dental exams once or twice a year. ?Routine eye exams. Ask your health care provider how often you should have your eyes checked. ?Personal lifestyle choices, including: ?Daily care of your teeth and gums. ?Regular physical activity. ?Eating a healthy diet. ?Avoiding tobacco and drug use. ?Limiting alcohol use. ?Practicing safe sex. ?Taking low-dose aspirin every day. ?Taking vitamin and mineral supplements as recommended by your health care provider. ?What happens during an annual well check? ?The services and screenings done by your health care provider during your annual well check will depend on your age, overall health, lifestyle risk factors, and family history  of disease. ?Counseling  ?Your health care provider may ask you questions about your: ?Alcohol use. ?Tobacco use. ?Drug use. ?Emotional well-being. ?Home and relationship well-being. ?Sexual activity. ?Eating habits. ?History of falls. ?Memory and ability to understand (cognition). ?Work and work Statistician. ?Reproductive health. ?Screening  ?You may have the following tests or measurements: ?Height, weight, and BMI. ?Blood pressure. ?Lipid and cholesterol levels. These may be checked every 5 years, or more frequently if you are over 70 years old. ?Skin check. ?Lung cancer screening. You may have this screening every year starting at age 62 if you have a 30-pack-year history of smoking and currently smoke or have quit within the past 15 years. ?Fecal occult blood test (FOBT) of the stool. You may have this test every year starting at age 69. ?Flexible sigmoidoscopy or colonoscopy. You may have a sigmoidoscopy every 5 years or a colonoscopy every 10 years starting at age 58. ?Hepatitis C blood test. ?Hepatitis B blood test. ?Sexually transmitted disease (STD) testing. ?Diabetes screening. This is done by checking your blood sugar (glucose) after you have not eaten for a while (fasting). You may have this done every 1-3 years. ?Bone density scan. This is done to screen for osteoporosis. You may have this done starting at age 28. ?Mammogram. This may be done every 1-2 years. Talk to your health care provider about how often you should have regular mammograms. ?Talk with your health care provider about your test results, treatment options, and if necessary, the need for more tests. ?Vaccines  ?Your health care provider may recommend certain vaccines, such as: ?Influenza vaccine. This is recommended every year. ?Tetanus, diphtheria, and acellular pertussis (Tdap, Td) vaccine. You may need a Td booster every  10 years. ?Zoster vaccine. You may need this after age 46. ?Pneumococcal 13-valent conjugate (PCV13) vaccine. One  dose is recommended after age 3. ?Pneumococcal polysaccharide (PPSV23) vaccine. One dose is recommended after age 20. ?Talk to your health care provider about which screenings and vaccines you need and how often you need them. ?This information is not intended to replace advice given to you by your health care provider. Make sure you discuss any questions you have with your health care provider. ?Document Released: 10/11/2015 Document Revised: 06/03/2016 Document Reviewed: 07/16/2015 ?Elsevier Interactive Patient Education ? 2017 [place]. ? ?Fall Prevention in the Home ?Falls can cause injuries. They can happen to people of all ages. There are many things you can do to make your home safe and to help prevent falls. ?What can I do on the outside of my home? ?Regularly fix the edges of walkways and driveways and fix any cracks. ?Remove anything that might make you trip as you walk through a door, such as a raised step or threshold. ?Trim any bushes or trees on the path to your home. ?Use bright outdoor lighting. ?Clear any walking paths of anything that might make someone trip, such as rocks or tools. ?Regularly check to see if handrails are loose or broken. Make sure that both sides of any steps have handrails. ?Any raised decks and porches should have guardrails on the edges. ?Have any leaves, snow, or ice cleared regularly. ?Use sand or salt on walking paths during winter. ?Clean up any spills in your garage right away. This includes oil or grease spills. ?What can I do in the bathroom? ?Use night lights. ?Install grab bars by the toilet and in the tub and shower. Do not use towel bars as grab bars. ?Use non-skid mats or decals in the tub or shower. ?If you need to sit down in the shower, use a plastic, non-slip stool. ?Keep the floor dry. Clean up any water that spills on the floor as soon as it happens. ?Remove soap buildup in the tub or shower regularly. ?Attach bath mats securely with double-sided  non-slip rug tape. ?Do not have throw rugs and other things on the floor that can make you trip. ?What can I do in the bedroom? ?Use night lights. ?Make sure that you have a light by your bed that is easy to reach. ?Do not use any sheets or blankets that are too big for your bed. They should not hang down onto the floor. ?Have a firm chair that has side arms. You can use this for support while you get dressed. ?Do not have throw rugs and other things on the floor that can make you trip. ?What can I do in the kitchen? ?Clean up any spills right away. ?Avoid walking on wet floors. ?Keep items that you use a lot in easy-to-reach places. ?If you need to reach something above you, use a strong step stool that has a grab bar. ?Keep electrical cords out of the way. ?Do not use floor polish or wax that makes floors slippery. If you must use wax, use non-skid floor wax. ?Do not have throw rugs and other things on the floor that can make you trip. ?What can I do with my stairs? ?Do not leave any items on the stairs. ?Make sure that there are handrails on both sides of the stairs and use them. Fix handrails that are broken or loose. Make sure that handrails are as long as the stairways. ?Check any carpeting to make  sure that it is firmly attached to the stairs. Fix any carpet that is loose or worn. ?Avoid having throw rugs at the top or bottom of the stairs. If you do have throw rugs, attach them to the floor with carpet tape. ?Make sure that you have a light switch at the top of the stairs and the bottom of the stairs. If you do not have them, ask someone to add them for you. ?What else can I do to help prevent falls? ?Wear shoes that: ?Do not have high heels. ?Have rubber bottoms. ?Are comfortable and fit you well. ?Are closed at the toe. Do not wear sandals. ?If you use a stepladder: ?Make sure that it is fully opened. Do not climb a closed stepladder. ?Make sure that both sides of the stepladder are locked into place. ?Ask  someone to hold it for you, if possible. ?Clearly mark and make sure that you can see: ?Any grab bars or handrails. ?First and last steps. ?Where the edge of each step is. ?Use tools that help you move aroun

## 2022-02-13 DIAGNOSIS — N1832 Chronic kidney disease, stage 3b: Secondary | ICD-10-CM | POA: Diagnosis not present

## 2022-02-15 DIAGNOSIS — H1031 Unspecified acute conjunctivitis, right eye: Secondary | ICD-10-CM | POA: Diagnosis not present

## 2022-02-15 DIAGNOSIS — J0100 Acute maxillary sinusitis, unspecified: Secondary | ICD-10-CM | POA: Diagnosis not present

## 2022-02-17 DIAGNOSIS — I129 Hypertensive chronic kidney disease with stage 1 through stage 4 chronic kidney disease, or unspecified chronic kidney disease: Secondary | ICD-10-CM | POA: Diagnosis not present

## 2022-02-17 DIAGNOSIS — N1832 Chronic kidney disease, stage 3b: Secondary | ICD-10-CM | POA: Diagnosis not present

## 2022-02-17 DIAGNOSIS — E1122 Type 2 diabetes mellitus with diabetic chronic kidney disease: Secondary | ICD-10-CM | POA: Diagnosis not present

## 2022-03-19 ENCOUNTER — Other Ambulatory Visit: Payer: Self-pay

## 2022-03-19 DIAGNOSIS — E119 Type 2 diabetes mellitus without complications: Secondary | ICD-10-CM

## 2022-03-19 MED ORDER — DAPAGLIFLOZIN PROPANEDIOL 5 MG PO TABS: 5.0000 mg | ORAL_TABLET | Freq: Every day | ORAL | 0 refills | Status: DC

## 2022-03-23 ENCOUNTER — Other Ambulatory Visit: Payer: Self-pay

## 2022-03-23 MED ORDER — REPAGLINIDE 1 MG PO TABS: ORAL_TABLET | ORAL | 0 refills | Status: DC

## 2022-03-24 DIAGNOSIS — Z85828 Personal history of other malignant neoplasm of skin: Secondary | ICD-10-CM | POA: Diagnosis not present

## 2022-03-24 DIAGNOSIS — L570 Actinic keratosis: Secondary | ICD-10-CM | POA: Diagnosis not present

## 2022-04-24 ENCOUNTER — Encounter: Payer: Self-pay | Admitting: Physician Assistant

## 2022-04-24 ENCOUNTER — Ambulatory Visit: Payer: Medicare PPO | Admitting: Physician Assistant

## 2022-04-24 VITALS — BP 124/62 | HR 100 | Temp 98.6°F | Ht 66.5 in | Wt 209.0 lb

## 2022-04-24 DIAGNOSIS — N1832 Chronic kidney disease, stage 3b: Secondary | ICD-10-CM

## 2022-04-24 DIAGNOSIS — E119 Type 2 diabetes mellitus without complications: Secondary | ICD-10-CM

## 2022-04-24 DIAGNOSIS — I10 Essential (primary) hypertension: Secondary | ICD-10-CM

## 2022-04-24 DIAGNOSIS — R0981 Nasal congestion: Secondary | ICD-10-CM

## 2022-04-24 LAB — POCT GLYCOSYLATED HEMOGLOBIN (HGB A1C): Hemoglobin A1C: 7.5 % — AB (ref 4.0–5.6)

## 2022-04-24 NOTE — Patient Instructions (Signed)
It was great to see you!  Trial nasal saline spray prior to medicated sprays and at night  Message or call me if you need an antibiotic  Continue current blood pressure and diabetic medications  Let's follow-up in 3 months, sooner if you have concerns.  Take care,  Inda Coke PA-C

## 2022-04-24 NOTE — Progress Notes (Signed)
Marie Rhodes is a 86 y.o. female here for a follow up of a pre-existing problem.  History of Present Illness:   Chief Complaint  Patient presents with   Diabetes    HPI  Diabetes 3 month follow-up. Current DM meds: farxiga 5 mg daily, repaglinide 3 mg BID. Blood sugars at home are: not checked. Patient is  compliant with medications. Denies: hypoglycemic or hyperglycemic episodes or symptoms. This patient's diabetes is complicated by HTN and CKD.  Lab Results  Component Value Date   HGBA1C 7.5 (A) 01/12/2022     CKD Currently seeing nephrology. She was given a good report from them. They told her to stop stressing over her diet as much -- eat everything in moderation. She denies any excessive ibuprofen or dehydration.    Sinus congestion Started Monday. Taking flonase and claritin, also tried coricidin. She has been resting and drinking plenty of water.    HTN Currently taking losartan 25 mg. At home blood pressure readings are: not checked. Patient denies chest pain, SOB, blurred vision, dizziness, unusual headaches, lower leg swelling. Patient is compliant with medication. Denies excessive caffeine intake, stimulant usage, excessive alcohol intake, or increase in salt consumption.  BP Readings from Last 3 Encounters:  04/24/22 124/62  02/02/22 130/72  01/27/22 134/80      Past Medical History:  Diagnosis Date   ANXIETY 04/27/2007   CROHN'S DISEASE, SMALL INTESTINE 06/08/2008   ileum, cecum   DEPRESSION 04/27/2007   Fatty tumor    left shoulder   GOITER, MULTINODULAR 05/04/2008   Hyperlipemia    Molar pregnancy 1967   OBESITY 08/01/2010   UNSPECIFIED ANEMIA 05/04/2008   Mild Anemia   Vitamin D deficiency 04/08/2018     Social History   Tobacco Use   Smoking status: Former    Types: Cigarettes    Quit date: 05/29/1988    Years since quitting: 33.9   Smokeless tobacco: Never  Substance Use Topics   Alcohol use: No   Drug use: No    Past Surgical History:   Procedure Laterality Date   APPENDECTOMY     BIOPSY THYROID     COLONOSCOPY  08/10/2006   normal   LEFT OOPHORECTOMY  1992   MECKEL DIVERTICULUM EXCISION     Ileal and cecal resection     Family History  Problem Relation Age of Onset   Breast cancer Mother 26   Kidney disease Mother        Dialysis   Diabetes Mother    Kidney cancer Mother        Renal Cell Carcinoma   Skin cancer Father        mets   Diabetes Father    Melanoma Sister    Diabetes Brother    Tongue cancer Brother 51   Stroke Maternal Grandmother    Heart disease Paternal Grandmother    Colon cancer Paternal Uncle     Allergies  Allergen Reactions   Tylenol With Codeine #3 [Acetaminophen-Codeine] Other (See Comments)    Constipation    Current Medications:   Current Outpatient Medications:    ALPRAZolam (XANAX) 0.25 MG tablet, TAKE AS NEEDED FOR ANXIETY., Disp: 30 tablet, Rfl: 0   dapagliflozin propanediol (FARXIGA) 5 MG TABS tablet, Take 1 tablet (5 mg total) by mouth daily., Disp: 90 tablet, Rfl: 0   ferrous sulfate 325 (65 FE) MG tablet, Take 1 tablet (325 mg total) by mouth 2 (two) times daily., Disp: , Rfl: 3   glucose  blood (ONETOUCH VERIO) test strip, 1 each by Other route as needed for other. And lancets 1/day, Disp: 100 each, Rfl: 12   losartan (COZAAR) 25 MG tablet, TAKE ONE TABLET BY MOUTH DAILY, Disp: 30 tablet, Rfl: 2   Multiple Vitamin (MULTIVITAMIN) tablet, Take 1 tablet by mouth at bedtime., Disp: , Rfl:    pravastatin (PRAVACHOL) 20 MG tablet, Take 1 tablet (20 mg total) by mouth daily., Disp: 90 tablet, Rfl: 1   repaglinide (PRANDIN) 1 MG tablet, TAKE 3 TABLETS TWICE DAILY BEFORE A MEAL, Disp: 540 tablet, Rfl: 0   vitamin B-12 (CYANOCOBALAMIN) 1000 MCG tablet, Take 1,000 mcg by mouth 2 (two) times daily. , Disp: , Rfl:    Review of Systems:   ROS Negative unless otherwise specified per HPI.   Vitals:   Vitals:   04/24/22 1503  BP: 124/62  Pulse: 100  Temp: 98.6 F (37 C)   TempSrc: Oral  SpO2: 96%  Weight: 209 lb (94.8 kg)  Height: 5' 6.5" (1.689 m)     Body mass index is 33.23 kg/m.  Physical Exam:   Physical Exam Vitals and nursing note reviewed.  Constitutional:      General: She is not in acute distress.    Appearance: She is well-developed. She is not ill-appearing or toxic-appearing.  HENT:     Head: Normocephalic and atraumatic.     Right Ear: Tympanic membrane, ear canal and external ear normal. Tympanic membrane is not erythematous, retracted or bulging.     Left Ear: Tympanic membrane, ear canal and external ear normal. Tympanic membrane is not erythematous, retracted or bulging.     Nose: Nose normal.     Right Sinus: No maxillary sinus tenderness or frontal sinus tenderness.     Left Sinus: No maxillary sinus tenderness or frontal sinus tenderness.     Mouth/Throat:     Pharynx: Uvula midline. No posterior oropharyngeal erythema.     Tonsils: 1+ on the right. 1+ on the left.  Eyes:     General: Lids are normal.     Conjunctiva/sclera: Conjunctivae normal.  Neck:     Trachea: Trachea normal.  Cardiovascular:     Rate and Rhythm: Normal rate and regular rhythm.     Pulses: Normal pulses.     Heart sounds: Normal heart sounds, S1 normal and S2 normal.  Pulmonary:     Effort: Pulmonary effort is normal.     Breath sounds: Normal breath sounds. No decreased breath sounds, wheezing, rhonchi or rales.  Lymphadenopathy:     Cervical: No cervical adenopathy.  Skin:    General: Skin is warm and dry.  Neurological:     Mental Status: She is alert.     GCS: GCS eye subscore is 4. GCS verbal subscore is 5. GCS motor subscore is 6.  Psychiatric:        Speech: Speech normal.        Behavior: Behavior normal. Behavior is cooperative.    Results for orders placed or performed in visit on 04/24/22  POCT glycosylated hemoglobin (Hb A1C)  Result Value Ref Range   Hemoglobin A1C 7.5 (A) 4.0 - 5.6 %   HbA1c POC (<> result, manual entry)      HbA1c, POC (prediabetic range)     HbA1c, POC (controlled diabetic range)       Assessment and Plan:   Type 2 diabetes mellitus without complication, without long-term current use of insulin (HCC) A1c acceptable for her age Continue farxiga 5  mg daily, repaglinide 3 mg BID Follow-up in 3 months, sooner if concerns  Chronic renal impairment, stage 3b (HCC) Continue management with nephrology  Sinus congestion No red flags; she is on day 4 of sx, suspect viral URI Recommend saline nasal spray and continued current care If sx persist or worsen, will start oral doxycycline  Essential hypertension Normotensive Continue losartan 25 mg daily Follow-up 3 months   Inda Coke, PA-C

## 2022-05-11 ENCOUNTER — Other Ambulatory Visit: Payer: Self-pay | Admitting: Physician Assistant

## 2022-06-08 ENCOUNTER — Other Ambulatory Visit: Payer: Self-pay | Admitting: Endocrinology

## 2022-06-08 DIAGNOSIS — E119 Type 2 diabetes mellitus without complications: Secondary | ICD-10-CM

## 2022-06-22 ENCOUNTER — Encounter: Payer: Self-pay | Admitting: *Deleted

## 2022-06-26 DIAGNOSIS — Z1231 Encounter for screening mammogram for malignant neoplasm of breast: Secondary | ICD-10-CM | POA: Diagnosis not present

## 2022-06-26 LAB — HM MAMMOGRAPHY

## 2022-07-15 ENCOUNTER — Encounter: Payer: Self-pay | Admitting: Physician Assistant

## 2022-07-20 ENCOUNTER — Ambulatory Visit: Payer: Medicare PPO | Admitting: Physician Assistant

## 2022-07-24 DIAGNOSIS — N1832 Chronic kidney disease, stage 3b: Secondary | ICD-10-CM | POA: Diagnosis not present

## 2022-07-27 ENCOUNTER — Ambulatory Visit: Payer: Medicare PPO | Admitting: Physician Assistant

## 2022-07-27 ENCOUNTER — Encounter: Payer: Self-pay | Admitting: Physician Assistant

## 2022-07-27 VITALS — BP 120/68 | HR 94 | Temp 97.8°F | Ht 66.5 in | Wt 210.0 lb

## 2022-07-27 DIAGNOSIS — E119 Type 2 diabetes mellitus without complications: Secondary | ICD-10-CM

## 2022-07-27 DIAGNOSIS — I10 Essential (primary) hypertension: Secondary | ICD-10-CM | POA: Diagnosis not present

## 2022-07-27 DIAGNOSIS — Z23 Encounter for immunization: Secondary | ICD-10-CM | POA: Diagnosis not present

## 2022-07-27 LAB — POCT GLYCOSYLATED HEMOGLOBIN (HGB A1C): Hemoglobin A1C: 6.6 % — AB (ref 4.0–5.6)

## 2022-07-27 MED ORDER — REPAGLINIDE 1 MG PO TABS: ORAL_TABLET | ORAL | 0 refills | Status: DC

## 2022-07-27 NOTE — Progress Notes (Signed)
Marie Rhodes is a 86 y.o. female here for a follow up of a pre-existing problem.  History of Present Illness:   Chief Complaint  Patient presents with   Diabetes   Hypertension    HPI  Diabetes Patient is complaint with her 1 mg Repaglinide 3 tabs twice a day and 5 mg Farxiga medication which is affordable. She is cooking regularly for her grandson and great-grandchild so she doesn't always have the healthiest options.  HTN Currently taking cozaar 25 mg daily. At home blood pressure readings are: WNL. Patient denies chest pain, SOB, blurred vision, dizziness, unusual headaches, lower leg swelling. Patient is compliant with medication. Denies excessive caffeine intake, stimulant usage, excessive alcohol intake, or increase in salt consumption.  BP Readings from Last 3 Encounters:  07/27/22 120/68  04/24/22 124/62  02/02/22 130/72     Past Medical History:  Diagnosis Date   ANXIETY 04/27/2007   CROHN'S DISEASE, SMALL INTESTINE 06/08/2008   ileum, cecum   DEPRESSION 04/27/2007   Fatty tumor    left shoulder   GOITER, MULTINODULAR 05/04/2008   Hyperlipemia    Molar pregnancy 1967   OBESITY 08/01/2010   UNSPECIFIED ANEMIA 05/04/2008   Mild Anemia   Vitamin D deficiency 04/08/2018     Social History   Tobacco Use   Smoking status: Former    Types: Cigarettes    Quit date: 05/29/1988    Years since quitting: 34.1   Smokeless tobacco: Never  Substance Use Topics   Alcohol use: No   Drug use: No    Past Surgical History:  Procedure Laterality Date   APPENDECTOMY     BIOPSY THYROID     COLONOSCOPY  08/10/2006   normal   LEFT OOPHORECTOMY  1992   MECKEL DIVERTICULUM EXCISION     Ileal and cecal resection     Family History  Problem Relation Age of Onset   Breast cancer Mother 68   Kidney disease Mother        Dialysis   Diabetes Mother    Kidney cancer Mother        Renal Cell Carcinoma   Skin cancer Father        mets   Diabetes Father    Melanoma Sister     Diabetes Brother    Tongue cancer Brother 70   Stroke Maternal Grandmother    Heart disease Paternal Grandmother    Colon cancer Paternal Uncle     Allergies  Allergen Reactions   Tylenol With Codeine #3 [Acetaminophen-Codeine] Other (See Comments)    Constipation    Current Medications:   Current Outpatient Medications:    ALPRAZolam (XANAX) 0.25 MG tablet, TAKE AS NEEDED FOR ANXIETY., Disp: 30 tablet, Rfl: 0   FARXIGA 5 MG TABS tablet, TAKE 1 TABLET EVERY DAY, Disp: 90 tablet, Rfl: 0   ferrous sulfate 325 (65 FE) MG tablet, Take 1 tablet (325 mg total) by mouth 2 (two) times daily., Disp: , Rfl: 3   glucose blood (ONETOUCH VERIO) test strip, 1 each by Other route as needed for other. And lancets 1/day, Disp: 100 each, Rfl: 12   losartan (COZAAR) 25 MG tablet, TAKE ONE TABLET BY MOUTH DAILY, Disp: 30 tablet, Rfl: 2   Multiple Vitamin (MULTIVITAMIN) tablet, Take 1 tablet by mouth at bedtime., Disp: , Rfl:    pravastatin (PRAVACHOL) 20 MG tablet, Take 1 tablet (20 mg total) by mouth daily., Disp: 90 tablet, Rfl: 1   vitamin B-12 (CYANOCOBALAMIN) 1000 MCG tablet,  Take 1,000 mcg by mouth 2 (two) times daily. , Disp: , Rfl:    repaglinide (PRANDIN) 1 MG tablet, TAKE 1 TABLET TWICE DAILY BEFORE A MEAL, Disp: 540 tablet, Rfl: 0   Review of Systems:   ROS Negative unless otherwise specified per HPI.   Vitals:   Vitals:   07/27/22 1409  BP: 120/68  Pulse: 94  Temp: 97.8 F (36.6 C)  TempSrc: Temporal  SpO2: 95%  Weight: 210 lb (95.3 kg)  Height: 5' 6.5" (1.689 m)     Body mass index is 33.39 kg/m.  Physical Exam:   Physical Exam Vitals and nursing note reviewed.  Constitutional:      General: She is not in acute distress.    Appearance: Normal appearance. She is well-developed. She is not ill-appearing or toxic-appearing.  HENT:     Head: Normocephalic and atraumatic.     Right Ear: External ear normal.     Left Ear: External ear normal.  Eyes:     Extraocular  Movements: Extraocular movements intact.     Pupils: Pupils are equal, round, and reactive to light.  Cardiovascular:     Rate and Rhythm: Normal rate and regular rhythm.     Pulses: Normal pulses.     Heart sounds: Normal heart sounds, S1 normal and S2 normal. No murmur heard.    No gallop.  Pulmonary:     Effort: Pulmonary effort is normal. No respiratory distress.     Breath sounds: Normal breath sounds. No wheezing or rales.  Skin:    General: Skin is warm and dry.  Neurological:     Mental Status: She is alert and oriented to person, place, and time.     GCS: GCS eye subscore is 4. GCS verbal subscore is 5. GCS motor subscore is 6.  Psychiatric:        Speech: Speech normal.        Behavior: Behavior normal. Behavior is cooperative.        Judgment: Judgment normal.    Results for orders placed or performed in visit on 07/27/22  POCT glycosylated hemoglobin (Hb A1C)  Result Value Ref Range   Hemoglobin A1C 6.6 (A) 4.0 - 5.6 %    Assessment and Plan:   Type 2 diabetes mellitus without complication, without long-term current use of insulin (HCC) A1c has improved Decrease prandin to 1 mg BID, hopefully stop at next visit Continue farxiga 5 mg  Follow-up in 6 months, sooner if concerns  Essential hypertension Normotensive Continue losartan 25 mg daily Follow-up in 6 months, sooner if concerns  Need for immunization against influenza Completed today  I,Verona Buck,acting as a scribe for [employer], PA.,have documented all relevant documentation on the behalf of Inda Coke, PA,as directed by  Inda Coke, PA while in the presence of Inda Coke, [place].  I, Inda Coke, [place], have reviewed all documentation for this visit. The documentation on 07/27/22 for the exam, diagnosis, procedures, and orders are all accurate and complete.   Inda Coke, PA-C

## 2022-07-27 NOTE — Patient Instructions (Addendum)
It was great to see you!  Decrease your prandin from 3 tablets to 1 tablet twice daily  Let's follow-up in 6 months, sooner if you have concerns.  Take care,  Inda Coke PA-C

## 2022-07-29 ENCOUNTER — Other Ambulatory Visit: Payer: Self-pay | Admitting: *Deleted

## 2022-07-29 ENCOUNTER — Telehealth: Payer: Self-pay | Admitting: Physician Assistant

## 2022-07-29 DIAGNOSIS — E119 Type 2 diabetes mellitus without complications: Secondary | ICD-10-CM

## 2022-07-29 NOTE — Telephone Encounter (Signed)
Noted  

## 2022-07-29 NOTE — Telephone Encounter (Signed)
FYI  Called patient to schedule lab visit for additional urine test. Pt states she will callback to schedule since she is at work. States she won't be able to come back in until Tuesday from what she can tell.

## 2022-07-30 ENCOUNTER — Other Ambulatory Visit: Payer: Self-pay | Admitting: Physician Assistant

## 2022-07-31 DIAGNOSIS — H524 Presbyopia: Secondary | ICD-10-CM | POA: Diagnosis not present

## 2022-07-31 DIAGNOSIS — E119 Type 2 diabetes mellitus without complications: Secondary | ICD-10-CM | POA: Diagnosis not present

## 2022-07-31 DIAGNOSIS — H5213 Myopia, bilateral: Secondary | ICD-10-CM | POA: Diagnosis not present

## 2022-07-31 DIAGNOSIS — H02831 Dermatochalasis of right upper eyelid: Secondary | ICD-10-CM | POA: Diagnosis not present

## 2022-07-31 DIAGNOSIS — H02834 Dermatochalasis of left upper eyelid: Secondary | ICD-10-CM | POA: Diagnosis not present

## 2022-07-31 DIAGNOSIS — H25813 Combined forms of age-related cataract, bilateral: Secondary | ICD-10-CM | POA: Diagnosis not present

## 2022-07-31 DIAGNOSIS — H52203 Unspecified astigmatism, bilateral: Secondary | ICD-10-CM | POA: Diagnosis not present

## 2022-08-03 DIAGNOSIS — N1832 Chronic kidney disease, stage 3b: Secondary | ICD-10-CM | POA: Diagnosis not present

## 2022-08-03 DIAGNOSIS — I129 Hypertensive chronic kidney disease with stage 1 through stage 4 chronic kidney disease, or unspecified chronic kidney disease: Secondary | ICD-10-CM | POA: Diagnosis not present

## 2022-08-03 DIAGNOSIS — E1122 Type 2 diabetes mellitus with diabetic chronic kidney disease: Secondary | ICD-10-CM | POA: Diagnosis not present

## 2022-08-03 DIAGNOSIS — N2581 Secondary hyperparathyroidism of renal origin: Secondary | ICD-10-CM | POA: Diagnosis not present

## 2022-08-07 DIAGNOSIS — R5383 Other fatigue: Secondary | ICD-10-CM | POA: Diagnosis not present

## 2022-08-07 DIAGNOSIS — J208 Acute bronchitis due to other specified organisms: Secondary | ICD-10-CM | POA: Diagnosis not present

## 2022-08-07 DIAGNOSIS — Z03818 Encounter for observation for suspected exposure to other biological agents ruled out: Secondary | ICD-10-CM | POA: Diagnosis not present

## 2022-08-07 DIAGNOSIS — R051 Acute cough: Secondary | ICD-10-CM | POA: Diagnosis not present

## 2022-08-13 ENCOUNTER — Other Ambulatory Visit: Payer: Self-pay | Admitting: Internal Medicine

## 2022-08-13 ENCOUNTER — Other Ambulatory Visit: Payer: Self-pay | Admitting: Physician Assistant

## 2022-08-25 ENCOUNTER — Telehealth: Payer: Self-pay | Admitting: Physician Assistant

## 2022-08-25 NOTE — Telephone Encounter (Signed)
Patient dropped of a letter explaining reason for UC visit on 11/10- patient stated xrays found a polyp - I advised pt to sch an apt with samantha- patient has been sch for 11/30 at 940am. Letter in [facility] folder

## 2022-08-26 NOTE — Telephone Encounter (Signed)
Noted  

## 2022-08-27 ENCOUNTER — Encounter: Payer: Self-pay | Admitting: Physician Assistant

## 2022-08-27 ENCOUNTER — Other Ambulatory Visit: Payer: Self-pay | Admitting: Physician Assistant

## 2022-08-27 ENCOUNTER — Ambulatory Visit: Payer: Medicare PPO | Admitting: Physician Assistant

## 2022-08-27 VITALS — BP 110/70 | HR 68 | Temp 98.0°F | Ht 66.5 in | Wt 209.0 lb

## 2022-08-27 DIAGNOSIS — E119 Type 2 diabetes mellitus without complications: Secondary | ICD-10-CM

## 2022-08-27 DIAGNOSIS — R052 Subacute cough: Secondary | ICD-10-CM | POA: Diagnosis not present

## 2022-08-27 LAB — MICROALBUMIN / CREATININE URINE RATIO
Creatinine,U: 20.3 mg/dL
Microalb Creat Ratio: 3.5 mg/g (ref 0.0–30.0)
Microalb, Ur: <0.7 mg/dL (ref 0.0–1.9)

## 2022-08-27 MED ORDER — REPAGLINIDE 1 MG PO TABS: 1.0000 mg | ORAL_TABLET | Freq: Two times a day (BID) | ORAL | 1 refills | Status: DC

## 2022-08-27 NOTE — Progress Notes (Signed)
Marie Rhodes is a 86 y.o. female here for a follow up of a pre-existing problem.  History of Present Illness:   Chief Complaint  Patient presents with   Bronchitis    Pt here for f/u from Urgent care. Chest x-ray found polyp and needs f/u x-ray.    HPI  Bronchitis Patient reports that she has been coughing clear sputum which then turned green and is wheezing. She was admitted to urgent care where she was prescribed a Zpak, steroids and albuterol inhaler. Urgent care ran a chest xray that showed a "polyp" and she is requesting a repeat xray to assess this. Patient states that she is feeling much better this visit and reports that breathing is good. Denies any further concerns regarding this   DM She is currently taking farxiga 5 mg daily and repaglinide 1 mg BID. She is requesting to do her urine microalbumin screening today.  Past Medical History:  Diagnosis Date   ANXIETY 04/27/2007   CROHN'S DISEASE, SMALL INTESTINE 06/08/2008   ileum, cecum   DEPRESSION 04/27/2007   Fatty tumor    left shoulder   GOITER, MULTINODULAR 05/04/2008   Hyperlipemia    Molar pregnancy 1967   OBESITY 08/01/2010   UNSPECIFIED ANEMIA 05/04/2008   Mild Anemia   Vitamin D deficiency 04/08/2018     Social History   Tobacco Use   Smoking status: Former    Types: Cigarettes    Quit date: 05/29/1988    Years since quitting: 34.2   Smokeless tobacco: Never  Substance Use Topics   Alcohol use: No   Drug use: No    Past Surgical History:  Procedure Laterality Date   APPENDECTOMY     BIOPSY THYROID     COLONOSCOPY  08/10/2006   normal   LEFT OOPHORECTOMY  1992   MECKEL DIVERTICULUM EXCISION     Ileal and cecal resection     Family History  Problem Relation Age of Onset   Breast cancer Mother 36   Kidney disease Mother        Dialysis   Diabetes Mother    Kidney cancer Mother        Renal Cell Carcinoma   Skin cancer Father        mets   Diabetes Father    Melanoma Sister    Diabetes  Brother    Tongue cancer Brother 36   Stroke Maternal Grandmother    Heart disease Paternal Grandmother    Colon cancer Paternal Uncle     Allergies  Allergen Reactions   Tylenol With Codeine #3 [Acetaminophen-Codeine] Other (See Comments)    Constipation    Current Medications:   Current Outpatient Medications:    ALPRAZolam (XANAX) 0.25 MG tablet, TAKE AS NEEDED FOR ANXIETY., Disp: 30 tablet, Rfl: 0   FARXIGA 5 MG TABS tablet, TAKE 1 TABLET EVERY DAY, Disp: 90 tablet, Rfl: 0   ferrous sulfate 325 (65 FE) MG tablet, Take 1 tablet (325 mg total) by mouth 2 (two) times daily., Disp: , Rfl: 3   glucose blood (ONETOUCH VERIO) test strip, 1 each by Other route as needed for other. And lancets 1/day, Disp: 100 each, Rfl: 12   losartan (COZAAR) 25 MG tablet, TAKE ONE TABLET BY MOUTH DAILY, Disp: 30 tablet, Rfl: 2   Multiple Vitamin (MULTIVITAMIN) tablet, Take 1 tablet by mouth at bedtime., Disp: , Rfl:    pravastatin (PRAVACHOL) 20 MG tablet, Take 1 tablet (20 mg total) by mouth daily., Disp: 90  tablet, Rfl: 1   repaglinide (PRANDIN) 1 MG tablet, TAKE 3 TABLETS TWICE DAILY BEFORE A MEAL (Patient taking differently: Take 1 mg by mouth 2 (two) times daily before a meal. TAKE 3 TABLETS TWICE DAILY BEFORE A MEAL), Disp: 180 tablet, Rfl: 0   vitamin B-12 (CYANOCOBALAMIN) 1000 MCG tablet, Take 1,000 mcg by mouth 2 (two) times daily. , Disp: , Rfl:    albuterol (VENTOLIN HFA) 108 (90 Base) MCG/ACT inhaler, Inhale 2 puffs into the lungs every 6 (six) hours as needed. (Patient not taking: Reported on 08/27/2022), Disp: , Rfl:    Review of Systems:   ROS Negative unless otherwise specified per HPI.  Vitals:   Vitals:   08/27/22 1000  BP: 110/70  Pulse: 68  Temp: 98 F (36.7 C)  TempSrc: Temporal  SpO2: 96%  Weight: 209 lb (94.8 kg)  Height: 5' 6.5" (1.689 m)     Body mass index is 33.23 kg/m.  Physical Exam:   Physical Exam Constitutional:      General: She is not in acute  distress.    Appearance: Normal appearance. She is not ill-appearing.  HENT:     Head: Normocephalic and atraumatic.     Right Ear: External ear normal.     Left Ear: External ear normal.  Eyes:     Extraocular Movements: Extraocular movements intact.     Pupils: Pupils are equal, round, and reactive to light.  Cardiovascular:     Rate and Rhythm: Normal rate and regular rhythm.     Heart sounds: Normal heart sounds. No murmur heard.    No gallop.  Pulmonary:     Effort: Pulmonary effort is normal. No respiratory distress.     Breath sounds: Normal breath sounds. No wheezing or rales.  Skin:    General: Skin is warm and dry.  Neurological:     Mental Status: She is alert and oriented to person, place, and time.  Psychiatric:        Judgment: Judgment normal.     Assessment and Plan:   Subacute cough Improving Due to abnormal CXR reading -- will request records from [facility] to review Will obtain repeat CXR next week Further work-up based on results and clinical symptoms  Type 2 diabetes mellitus without complication, without long-term current use of insulin ([place]) Stable Continue prandin and farxiga Update urine micro today Follow-up in 6 months  I,Verona Buck,acting as a Education administrator for [employer], PA.,have documented all relevant documentation on the behalf of Inda Coke, PA,as directed by  Inda Coke, PA while in the presence of Inda Coke, [place].  I, Inda Coke, [place], have reviewed all documentation for this visit. The documentation on 08/27/22 for the exam, diagnosis, procedures, and orders are all accurate and complete.  Inda Coke, PA-C

## 2022-08-27 NOTE — Patient Instructions (Signed)
It was great to see you!  An order for an xray has been put in for you. To get your xray, you can walk in at the [facility] location (Dr Celesta Aver building) without a scheduled appointment.  The address is [address]. It is across the street from [place] is located in the basement.  Hours of operation are M-F 8:30am to 5:00pm. Please note that they are closed for lunch between 12:30 and 1:00pm.  I have sent in an updated prescription of your repaglinide (Dr Cordelia Pen office sent in the refill on the most recent script)  Take care,  Inda Coke PA-C

## 2022-08-31 ENCOUNTER — Other Ambulatory Visit: Payer: Medicare PPO

## 2022-08-31 ENCOUNTER — Ambulatory Visit (INDEPENDENT_AMBULATORY_CARE_PROVIDER_SITE_OTHER)
Admission: RE | Admit: 2022-08-31 | Discharge: 2022-08-31 | Disposition: A | Discharge status: Home / Self Care | Payer: Medicare PPO | Source: Ambulatory Visit | Attending: Physician Assistant | Admitting: Physician Assistant

## 2022-08-31 DIAGNOSIS — R052 Subacute cough: Secondary | ICD-10-CM

## 2022-08-31 DIAGNOSIS — J984 Other disorders of lung: Secondary | ICD-10-CM | POA: Diagnosis not present

## 2022-08-31 DIAGNOSIS — I700 Atherosclerosis of aorta: Secondary | ICD-10-CM | POA: Diagnosis not present

## 2022-08-31 DIAGNOSIS — R911 Solitary pulmonary nodule: Secondary | ICD-10-CM | POA: Diagnosis not present

## 2022-09-03 ENCOUNTER — Other Ambulatory Visit: Payer: Self-pay | Admitting: Physician Assistant

## 2022-09-03 DIAGNOSIS — R911 Solitary pulmonary nodule: Secondary | ICD-10-CM

## 2022-09-08 DIAGNOSIS — R051 Acute cough: Secondary | ICD-10-CM | POA: Diagnosis not present

## 2022-10-08 ENCOUNTER — Ambulatory Visit
Admission: RE | Admit: 2022-10-08 | Discharge: 2022-10-08 | Disposition: A | Discharge status: Home / Self Care | Payer: Medicare PPO | Source: Ambulatory Visit | Attending: Physician Assistant | Admitting: Physician Assistant

## 2022-10-08 DIAGNOSIS — I7789 Other specified disorders of arteries and arterioles: Secondary | ICD-10-CM | POA: Diagnosis not present

## 2022-10-08 DIAGNOSIS — J439 Emphysema, unspecified: Secondary | ICD-10-CM | POA: Diagnosis not present

## 2022-10-08 DIAGNOSIS — I700 Atherosclerosis of aorta: Secondary | ICD-10-CM | POA: Diagnosis not present

## 2022-10-08 DIAGNOSIS — R918 Other nonspecific abnormal finding of lung field: Secondary | ICD-10-CM | POA: Diagnosis not present

## 2022-10-08 DIAGNOSIS — R911 Solitary pulmonary nodule: Secondary | ICD-10-CM

## 2022-10-08 MED ORDER — IOPAMIDOL (ISOVUE-300) INJECTION 61%
75.0000 mL | Freq: Once | INTRAVENOUS | Status: AC | PRN
  Administered 2022-10-08: 75 mL via INTRAVENOUS

## 2022-10-12 ENCOUNTER — Other Ambulatory Visit: Payer: Self-pay | Admitting: Physician Assistant

## 2022-10-12 DIAGNOSIS — J984 Other disorders of lung: Secondary | ICD-10-CM

## 2022-10-12 DIAGNOSIS — R9389 Abnormal findings on diagnostic imaging of other specified body structures: Secondary | ICD-10-CM

## 2022-10-20 ENCOUNTER — Encounter: Payer: Self-pay | Admitting: Pulmonary Disease

## 2022-10-20 ENCOUNTER — Ambulatory Visit: Payer: Medicare PPO | Admitting: Pulmonary Disease

## 2022-10-20 ENCOUNTER — Other Ambulatory Visit: Payer: Self-pay | Admitting: Pulmonary Disease

## 2022-10-20 VITALS — BP 120/60 | HR 107 | Ht 66.0 in | Wt 206.8 lb

## 2022-10-20 DIAGNOSIS — R918 Other nonspecific abnormal finding of lung field: Secondary | ICD-10-CM

## 2022-10-20 DIAGNOSIS — Q2730 Arteriovenous malformation, site unspecified: Secondary | ICD-10-CM | POA: Diagnosis not present

## 2022-10-20 NOTE — Progress Notes (Signed)
Synopsis: Referred in January 2024 for abnormal CT chest by Jarold Motto, PA  Subjective:   PATIENT ID: Marie Rhodes GENDER: female DOB: [birth year], MRN: [id number]  Chief Complaint  Patient presents with   Consult    Lung nodule.    This is an 87 year old female, past medical history of Crohn's disease, hyperlipidemia, obesity vitamin D deficiency, molar pregnancy.  She had a chest x-ray that showed a abnormality in the right chest. Chest x-ray revealed a perihilar nodule and they evaluated further with CT imaging.  CT imaging completed on 10/08/2022 which was filled a 2.5 centimeter lesion in the right upper lobe adjacent to the right hilum.  He has similar Hounsfield units to the aorta and left appendage.  Due to the Hounsfield unit of the lesion the concern was this being a possible pulmonary AVM.  There is appears to be some draining peripheral veins distally to this lesion.  Felt more to be an AVM versus a true pulmonary nodule.  Therefore request further evaluation and refer to pulmonary.  She also has a thyroid goiter and some mildly enlarged pulmonary arteries.    Past Medical History:  Diagnosis Date   ANXIETY 04/27/2007   CROHN'S DISEASE, SMALL INTESTINE 06/08/2008   ileum, cecum   DEPRESSION 04/27/2007   Fatty tumor    left shoulder   GOITER, MULTINODULAR 05/04/2008   Hyperlipemia    Molar pregnancy 1967   OBESITY 08/01/2010   UNSPECIFIED ANEMIA 05/04/2008   Mild Anemia   Vitamin D deficiency 04/08/2018     Family History  Problem Relation Age of Onset   Breast cancer Mother 35   Kidney disease Mother        Dialysis   Diabetes Mother    Kidney cancer Mother        Renal Cell Carcinoma   Skin cancer Father        mets   Diabetes Father    Melanoma Sister    Diabetes Brother    Tongue cancer Brother 7   Stroke Maternal Grandmother    Heart disease Paternal Grandmother    Colon cancer Paternal Uncle      Past Surgical History:  Procedure Laterality Date    APPENDECTOMY     BIOPSY THYROID     COLONOSCOPY  08/10/2006   normal   LEFT OOPHORECTOMY  1992   MECKEL DIVERTICULUM EXCISION     Ileal and cecal resection     Social History   Socioeconomic History   Marital status: Widowed    Spouse name: Not on file   Number of children: Not on file   Years of education: Not on file   Highest education level: Not on file  Occupational History   Occupation: retired Runner, broadcasting/film/video    Comment: retired Printmaker)  Tobacco Use   Smoking status: Former    Types: Cigarettes    Quit date: 05/29/1988    Years since quitting: 34.4   Smokeless tobacco: Never  Substance and Sexual Activity   Alcohol use: No   Drug use: No   Sexual activity: Not on file  Other Topics Concern   Not on file  Social History Narrative   Patient is widowed --husband died from brain aneurysm, she is a retired Runner, 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   Former Games developer (college only), no alcohol or drug use   Both of her grandchildren lived with her while they went to college  As of November 2019, she lives alone   Social Determinants of Health   Financial Resource Strain: Low Risk  (02/02/2022)   Overall Financial Resource Strain (CARDIA)    Difficulty of Paying Living Expenses: Not hard at all  Food Insecurity: No Food Insecurity (02/02/2022)   Hunger Vital Sign    Worried About Running Out of Food in the Last Year: Never true    Ran Out of Food in the Last Year: Never true  Transportation Needs: No Transportation Needs (02/02/2022)   PRAPARE - Administrator, Civil Service (Medical): No    Lack of Transportation (Non-Medical): No  Physical Activity: Inactive (02/02/2022)   Exercise Vital Sign    Days of Exercise per Week: 0 days    Minutes of Exercise per Session: 0 min  Stress: No Stress Concern Present (02/02/2022)   [employer] of Occupational Health - Occupational Stress Questionnaire    Feeling of Stress :  Not at all  Social Connections: Moderately Isolated (02/02/2022)   Social Connection and Isolation Panel [NHANES]    Frequency of Communication with Friends and Family: More than three times a week    Frequency of Social Gatherings with Friends and Family: More than three times a week    Attends Religious Services: More than 4 times per year    Active Member of [employer] or Organizations: No    Attends Banker Meetings: Never    Marital Status: Widowed  Intimate Partner Violence: Not At Risk (02/02/2022)   Humiliation, Afraid, Rape, and Kick questionnaire    Fear of Current or Ex-Partner: No    Emotionally Abused: No    Physically Abused: No    Sexually Abused: No     Allergies  Allergen Reactions   Tylenol With Codeine #3 [Acetaminophen-Codeine] Other (See Comments)    Constipation     Outpatient Medications Prior to Visit  Medication Sig Dispense Refill   albuterol (VENTOLIN HFA) 108 (90 Base) MCG/ACT inhaler Inhale 2 puffs into the lungs every 6 (six) hours as needed.     ALPRAZolam (XANAX) 0.25 MG tablet TAKE AS NEEDED FOR ANXIETY. 30 tablet 0   FARXIGA 5 MG TABS tablet TAKE 1 TABLET EVERY DAY 90 tablet 0   ferrous sulfate 325 (65 FE) MG tablet Take 1 tablet (325 mg total) by mouth 2 (two) times daily.  3   glucose blood (ONETOUCH VERIO) test strip 1 each by Other route as needed for other. And lancets 1/day 100 each 12   losartan (COZAAR) 25 MG tablet TAKE ONE TABLET BY MOUTH DAILY 30 tablet 2   Multiple Vitamin (MULTIVITAMIN) tablet Take 1 tablet by mouth at bedtime.     pravastatin (PRAVACHOL) 20 MG tablet Take 1 tablet (20 mg total) by mouth daily. 90 tablet 1   repaglinide (PRANDIN) 1 MG tablet Take 1 tablet (1 mg total) by mouth 2 (two) times daily before a meal. 180 tablet 1   vitamin B-12 (CYANOCOBALAMIN) 1000 MCG tablet Take 1,000 mcg by mouth 2 (two) times daily.      No facility-administered medications prior to visit.    Review of Systems  Constitutional:   Negative for chills, fever, malaise/fatigue and weight loss.  HENT:  Negative for hearing loss, sore throat and tinnitus.   Eyes:  Negative for blurred vision and double vision.  Respiratory:  Negative for cough, hemoptysis, sputum production, wheezing and stridor.   Cardiovascular:  Negative for chest pain, palpitations, orthopnea, leg swelling and PND.  Gastrointestinal:  Negative for abdominal pain, constipation, diarrhea, heartburn, nausea and vomiting.  Genitourinary:  Negative for dysuria, hematuria and urgency.  Musculoskeletal:  Negative for joint pain and myalgias.  Skin:  Negative for itching and rash.  Neurological:  Negative for dizziness, tingling, weakness and headaches.  Endo/Heme/Allergies:  Negative for environmental allergies. Does not bruise/bleed easily.  Psychiatric/Behavioral:  Negative for depression. The patient is not nervous/anxious and does not have insomnia.   All other systems reviewed and are negative.    Objective:  Physical Exam Vitals reviewed.  Constitutional:      General: She is not in acute distress.    Appearance: She is well-developed.  HENT:     Head: Normocephalic and atraumatic.  Eyes:     General: No scleral icterus.    Conjunctiva/sclera: Conjunctivae normal.     Pupils: Pupils are equal, round, and reactive to light.  Neck:     Vascular: No JVD.     Trachea: No tracheal deviation.  Cardiovascular:     Rate and Rhythm: Normal rate and regular rhythm.     Heart sounds: Normal heart sounds. No murmur heard. Pulmonary:     Effort: Pulmonary effort is normal. No tachypnea, accessory muscle usage or respiratory distress.     Breath sounds: No stridor. No wheezing, rhonchi or rales.  Abdominal:     General: There is no distension.     Palpations: Abdomen is soft.     Tenderness: There is no abdominal tenderness.  Musculoskeletal:        General: No tenderness.     Cervical back: Neck supple.  Lymphadenopathy:     Cervical: No cervical  adenopathy.  Skin:    General: Skin is warm and dry.     Capillary Refill: Capillary refill takes less than 2 seconds.     Findings: No rash.  Neurological:     Mental Status: She is alert and oriented to person, place, and time.  Psychiatric:        Behavior: Behavior normal.      Vitals:   10/20/22 1328  BP: 120/60  Pulse: (!) 107  SpO2: 94%  Weight: 206 lb 12.8 oz (93.8 kg)  Height: 5\' 6"  (1.676 m)   94% on RA BMI Readings from Last 3 Encounters:  10/20/22 33.38 kg/m  08/27/22 33.23 kg/m  07/27/22 33.39 kg/m   Wt Readings from Last 3 Encounters:  10/20/22 206 lb 12.8 oz (93.8 kg)  08/27/22 209 lb (94.8 kg)  07/27/22 210 lb (95.3 kg)     CBC    Component Value Date/Time   WBC 10.0 10/25/2020 1540   RBC 4.60 10/25/2020 1540   HGB 14.3 10/25/2020 1540   HCT 41.7 10/25/2020 1540   PLT 254 10/25/2020 1540   MCV 90.7 10/25/2020 1540   MCH 31.1 10/25/2020 1540   MCHC 34.3 10/25/2020 1540   RDW 12.8 10/25/2020 1540   LYMPHSABS 3,580 10/25/2020 1540   MONOABS 0.5 10/19/2019 1007   EOSABS 320 10/25/2020 1540   BASOSABS 60 10/25/2020 1540     Chest Imaging: January 2024 CT chest: Enlarged 2.5 cm round lesion within the right chest concern for possible pulmonary AVM. The patient's images have been independently reviewed by me.    Pulmonary Functions Testing Results:     No data to display          FeNO:   Pathology:   Echocardiogram:   Heart Catheterization:     Assessment & Plan:  ICD-10-CM   1. AVM (arteriovenous malformation)  Q27.30 Ambulatory referral to Interventional Radiology    CT Angio Chest W/Cm &/Or Wo Cm    2. Abnormal CT scan, lung  R91.8       Discussion:  This is a 87 year old female seen today for evaluation of abnormal CT imaging.  She had a CT chest after having abnormal chest x-ray which revealed this 2.5 cm density in the right chest concerning for pulmonary AVM.  Plan: Will obtain a CTA of the chest. I think  an angiogram will help define if this is a pulmonary AVM or not. Due to its size and her age I think the only option would be considerations for a true angiography and evaluation by interventional radiology.  I will reach out to one of our IR colleagues to review the case. Due to her age she would not be a surgical candidate for resection. RTC in 6 weeks    Current Outpatient Medications:    albuterol (VENTOLIN HFA) 108 (90 Base) MCG/ACT inhaler, Inhale 2 puffs into the lungs every 6 (six) hours as needed., Disp: , Rfl:    ALPRAZolam (XANAX) 0.25 MG tablet, TAKE AS NEEDED FOR ANXIETY., Disp: 30 tablet, Rfl: 0   FARXIGA 5 MG TABS tablet, TAKE 1 TABLET EVERY DAY, Disp: 90 tablet, Rfl: 0   ferrous sulfate 325 (65 FE) MG tablet, Take 1 tablet (325 mg total) by mouth 2 (two) times daily., Disp: , Rfl: 3   glucose blood (ONETOUCH VERIO) test strip, 1 each by Other route as needed for other. And lancets 1/day, Disp: 100 each, Rfl: 12   losartan (COZAAR) 25 MG tablet, TAKE ONE TABLET BY MOUTH DAILY, Disp: 30 tablet, Rfl: 2   Multiple Vitamin (MULTIVITAMIN) tablet, Take 1 tablet by mouth at bedtime., Disp: , Rfl:    pravastatin (PRAVACHOL) 20 MG tablet, Take 1 tablet (20 mg total) by mouth daily., Disp: 90 tablet, Rfl: 1   repaglinide (PRANDIN) 1 MG tablet, Take 1 tablet (1 mg total) by mouth 2 (two) times daily before a meal., Disp: 180 tablet, Rfl: 1   vitamin B-12 (CYANOCOBALAMIN) 1000 MCG tablet, Take 1,000 mcg by mouth 2 (two) times daily. , Disp: , Rfl:    Josephine Igo, DO [facility] 10/20/2022 2:05 PM

## 2022-10-20 NOTE — Patient Instructions (Signed)
Thank you for visiting Dr. Tonia Brooms at [facility]. Today we recommend the following:  Orders Placed This Encounter  Procedures   CT Angio Chest W/Cm &/Or Wo Cm   Ambulatory referral to Interventional Radiology   Return in about 6 weeks (around 12/01/2022) for with APP or Dr. Tonia Brooms.    Please do your part to reduce the spread of COVID-19.

## 2022-10-21 ENCOUNTER — Ambulatory Visit (HOSPITAL_BASED_OUTPATIENT_CLINIC_OR_DEPARTMENT_OTHER): Payer: Medicare PPO

## 2022-10-24 ENCOUNTER — Ambulatory Visit (HOSPITAL_BASED_OUTPATIENT_CLINIC_OR_DEPARTMENT_OTHER): Payer: Medicare PPO

## 2022-10-26 ENCOUNTER — Telehealth: Payer: Self-pay | Admitting: Pulmonary Disease

## 2022-10-26 ENCOUNTER — Encounter (HOSPITAL_BASED_OUTPATIENT_CLINIC_OR_DEPARTMENT_OTHER): Payer: Self-pay

## 2022-10-26 ENCOUNTER — Ambulatory Visit (HOSPITAL_BASED_OUTPATIENT_CLINIC_OR_DEPARTMENT_OTHER)
Admission: RE | Admit: 2022-10-26 | Discharge: 2022-10-26 | Disposition: A | Discharge status: Home / Self Care | Payer: Medicare PPO | Source: Ambulatory Visit | Attending: Pulmonary Disease | Admitting: Pulmonary Disease

## 2022-10-26 DIAGNOSIS — Q2730 Arteriovenous malformation, site unspecified: Secondary | ICD-10-CM | POA: Diagnosis not present

## 2022-10-26 DIAGNOSIS — J9809 Other diseases of bronchus, not elsewhere classified: Secondary | ICD-10-CM | POA: Diagnosis not present

## 2022-10-26 LAB — POCT I-STAT CREATININE: Creatinine, Ser: 1.3 mg/dL — ABNORMAL HIGH (ref 0.44–1.00)

## 2022-10-26 MED ORDER — IOHEXOL 350 MG/ML SOLN
100.0000 mL | Freq: Once | INTRAVENOUS | Status: AC | PRN
  Administered 2022-10-26: 60 mL via INTRAVENOUS

## 2022-10-26 NOTE — Telephone Encounter (Signed)
Called and spoke with pt letting her know the info per Dr. Valeta Harms and have scheduled pt an OV with BI. Per BI, okay to DB pt for an appt so appt scheduled for pt 2/1 at 1:30. Nothing further needed.

## 2022-10-26 NOTE — Telephone Encounter (Signed)
PCCM:  Please let patient know that I have reviewed her CT scan results.  I think she needs a appointment to see me to discuss next steps.  Please schedule patient to see me this week.  Either Wednesday or Thursday.  Please let the patient know that the contrasted CT images that were complete do not suggest that the lesion is a pulmonary AVM.  Garner Nash, DO [facility] 10/26/2022 4:07 PM

## 2022-10-26 NOTE — Telephone Encounter (Signed)
Called [facility] and spoke to Gahanna:   IMPRESSION: 1. Rounded, contrast enhancing nodule of the perihilar right middle lobe appears to occlude the lateral segment bronchus, with endobronchial impaction distally in the lateral segment. This nodule measures 2.6 x 2.2 cm, and although contrast enhancing, is significantly lower attenuation than blood pool on all phases. This is not consistent with a pulmonary AVM as initially suspected, and notably was not present on radiographs dated 03/26/2018, and is concerning for neoplasm such as endobronchial carcinoid. 2. Diffuse bilateral bronchial wall thickening, consistent with nonspecific infectious or inflammatory bronchitis. 3. Mild cardiomegaly. Enlargement of the main pulmonary artery, as can be seen in pulmonary hypertension.

## 2022-10-29 ENCOUNTER — Encounter: Payer: Self-pay | Admitting: Pulmonary Disease

## 2022-10-29 ENCOUNTER — Ambulatory Visit (INDEPENDENT_AMBULATORY_CARE_PROVIDER_SITE_OTHER): Payer: Medicare PPO | Admitting: Pulmonary Disease

## 2022-10-29 VITALS — BP 130/78 | HR 96 | Ht 66.0 in | Wt 208.0 lb

## 2022-10-29 DIAGNOSIS — R918 Other nonspecific abnormal finding of lung field: Secondary | ICD-10-CM | POA: Diagnosis not present

## 2022-10-29 DIAGNOSIS — R911 Solitary pulmonary nodule: Secondary | ICD-10-CM | POA: Insufficient documentation

## 2022-10-29 NOTE — Patient Instructions (Addendum)
Thank you for visiting Dr. Valeta Harms at [facility]. Today we recommend the following:  Orders Placed This Encounter  Procedures   Procedural/ Surgical Case Request: ROBOTIC ASSISTED NAVIGATIONAL BRONCHOSCOPY   Ambulatory referral to Pulmonology   Bronchoscopy on 11/24/2022  STOP repaglinide 7 days prior   Return in about 5 weeks (around 12/01/2022) for w/ Eric Form, NP .    Please do your part to reduce the spread of COVID-19.

## 2022-10-29 NOTE — Progress Notes (Signed)
Synopsis: Referred in January 2024 for abnormal CT chest by Inda Coke, PA  Subjective:   PATIENT ID: Marie Rhodes GENDER: female DOB: 09-26-1936, MRN: [id number]  Chief Complaint  Patient presents with   Follow-up    F/up    This is an 87 year old female, past medical history of Crohn's disease, hyperlipidemia, obesity vitamin D deficiency, molar pregnancy.  She had a chest x-ray that showed a abnormality in the right chest. Chest x-ray revealed a perihilar nodule and they evaluated further with CT imaging.  CT imaging completed on 10/08/2022 which was filled a 2.5 centimeter lesion in the right upper lobe adjacent to the right hilum.  He has similar Hounsfield units to the aorta and left appendage.  Due to the Hounsfield unit of the lesion the concern was this being a possible pulmonary AVM.  There is appears to be some draining peripheral veins distally to this lesion.  Felt more to be an AVM versus a true pulmonary nodule.  Therefore request further evaluation and refer to pulmonary.  She also has a thyroid goiter and some mildly enlarged pulmonary arteries.  OV 10/29/2022: Here today for follow-up after CTA imaging.  CTA imaging is more consistent with the lesion being not an AVM there was no contrast-enhancement.  It is concerning that is probably a low-grade malignancy such as a carcinoid.  Here today to review CT imaging.    Past Medical History:  Diagnosis Date   ANXIETY 04/27/2007   CROHN'S DISEASE, SMALL INTESTINE 06/08/2008   ileum, cecum   DEPRESSION 04/27/2007   Fatty tumor    left shoulder   GOITER, MULTINODULAR 05/04/2008   Hyperlipemia    Molar pregnancy 1967   OBESITY 08/01/2010   UNSPECIFIED ANEMIA 05/04/2008   Mild Anemia   Vitamin D deficiency 04/08/2018     Family History  Problem Relation Age of Onset   Breast cancer Mother 62   Kidney disease Mother        Dialysis   Diabetes Mother    Kidney cancer Mother        Renal Cell Carcinoma   Skin cancer  Father        mets   Diabetes Father    Melanoma Sister    Diabetes Brother    Tongue cancer Brother 47   Stroke Maternal Grandmother    Heart disease Paternal Grandmother    Colon cancer Paternal Uncle      Past Surgical History:  Procedure Laterality Date   APPENDECTOMY     BIOPSY THYROID     COLONOSCOPY  08/10/2006   normal   LEFT OOPHORECTOMY  1992   MECKEL DIVERTICULUM EXCISION     Ileal and cecal resection     Social History   Socioeconomic History   Marital status: Widowed    Spouse name: Not on file   Number of children: Not on file   Years of education: Not on file   Highest education level: Not on file  Occupational History   Occupation: retired Pharmacist, hospital    Comment: retired Merchant navy officer)  Tobacco Use   Smoking status: Former    Types: Cigarettes    Quit date: 05/29/1988    Years since quitting: 34.4   Smokeless tobacco: Never  Substance and Sexual Activity   Alcohol use: No   Drug use: No   Sexual activity: Not on file  Other Topics Concern   Not on file  Social History Narrative   Patient is widowed --husband died from  brain aneurysm, she is a retired Pharmacist, 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   Former Research scientist (life sciences) (college only), no alcohol or drug use   Both of her grandchildren lived with her while they went to college    As of November 2019, she lives alone   Social Determinants of Health   Financial Resource Strain: [place]  (02/02/2022)   Overall Financial Resource Strain (CARDIA)    Difficulty of Paying Living Expenses: Not hard at all  Food Insecurity: No Food Insecurity (02/02/2022)   Hunger Vital Sign    Worried About Running Out of Food in the Last Year: Never true    [place] in the Last Year: Never true  Transportation Needs: No Transportation Needs (02/02/2022)   PRAPARE - Hydrologist (Medical): No    Lack of Transportation (Non-Medical): No  Physical Activity:  Inactive (02/02/2022)   Exercise Vital Sign    Days of Exercise per Week: 0 days    Minutes of Exercise per Session: 0 min  Stress: No Stress Concern Present (02/02/2022)   [place]    Feeling of Stress : Not at all  Social Connections: Moderately Isolated (02/02/2022)   Social Connection and Isolation Panel [NHANES]    Frequency of Communication with Friends and Family: More than three times a week    Frequency of Social Gatherings with Friends and Family: More than three times a week    Attends Religious Services: More than 4 times per year    Active Member of [employer] or Organizations: No    Attends Archivist Meetings: Never    Marital Status: Widowed  Intimate Partner Violence: Not At Risk (02/02/2022)   Humiliation, Afraid, Rape, and Kick questionnaire    Fear of Current or Ex-Partner: No    Emotionally Abused: No    Physically Abused: No    Sexually Abused: No     Allergies  Allergen Reactions   Tylenol With Codeine #3 [Acetaminophen-Codeine] Other (See Comments)    Constipation     Outpatient Medications Prior to Visit  Medication Sig Dispense Refill   albuterol (VENTOLIN HFA) 108 (90 Base) MCG/ACT inhaler Inhale 2 puffs into the lungs every 6 (six) hours as needed.     ALPRAZolam (XANAX) 0.25 MG tablet TAKE AS NEEDED FOR ANXIETY. 30 tablet 0   FARXIGA 5 MG TABS tablet TAKE 1 TABLET EVERY DAY 90 tablet 0   ferrous sulfate 325 (65 FE) MG tablet Take 1 tablet (325 mg total) by mouth 2 (two) times daily.  3   glucose blood (ONETOUCH VERIO) test strip 1 each by Other route as needed for other. And lancets 1/day 100 each 12   losartan (COZAAR) 25 MG tablet TAKE ONE TABLET BY MOUTH DAILY 30 tablet 2   Multiple Vitamin (MULTIVITAMIN) tablet Take 1 tablet by mouth at bedtime.     pravastatin (PRAVACHOL) 20 MG tablet Take 1 tablet (20 mg total) by mouth daily. 90 tablet 1   repaglinide (PRANDIN) 1 MG tablet Take  1 tablet (1 mg total) by mouth 2 (two) times daily before a meal. 180 tablet 1   vitamin B-12 (CYANOCOBALAMIN) 1000 MCG tablet Take 1,000 mcg by mouth 2 (two) times daily.      No facility-administered medications prior to visit.    Review of Systems  Constitutional:  Negative for chills, fever, malaise/fatigue and weight  loss.  HENT:  Negative for hearing loss, sore throat and tinnitus.   Eyes:  Negative for blurred vision and double vision.  Respiratory:  Negative for cough, hemoptysis, sputum production, shortness of breath, wheezing and stridor.   Cardiovascular:  Negative for chest pain, palpitations, orthopnea, leg swelling and PND.  Gastrointestinal:  Negative for abdominal pain, constipation, diarrhea, heartburn, nausea and vomiting.  Genitourinary:  Negative for dysuria, hematuria and urgency.  Musculoskeletal:  Negative for joint pain and myalgias.  Skin:  Negative for itching and rash.  Neurological:  Negative for dizziness, tingling, weakness and headaches.  Endo/Heme/Allergies:  Negative for environmental allergies. Does not bruise/bleed easily.  Psychiatric/Behavioral:  Negative for depression. The patient is not nervous/anxious and does not have insomnia.   All other systems reviewed and are negative.    Objective:  Physical Exam Vitals reviewed.  Constitutional:      General: She is not in acute distress.    Appearance: She is well-developed.  HENT:     Head: Normocephalic and atraumatic.  Eyes:     General: No scleral icterus.    Conjunctiva/sclera: Conjunctivae normal.     Pupils: Pupils are equal, round, and reactive to light.  Neck:     Vascular: No JVD.     Trachea: No tracheal deviation.  Cardiovascular:     Rate and Rhythm: Normal rate and regular rhythm.     Heart sounds: Normal heart sounds. No murmur heard. Pulmonary:     Effort: Pulmonary effort is normal. No tachypnea, accessory muscle usage or respiratory distress.     Breath sounds: No stridor.  No wheezing, rhonchi or rales.  Abdominal:     General: There is no distension.     Palpations: Abdomen is soft.     Tenderness: There is no abdominal tenderness.  Musculoskeletal:        General: No tenderness.     Cervical back: Neck supple.  Lymphadenopathy:     Cervical: No cervical adenopathy.  Skin:    General: Skin is warm and dry.     Capillary Refill: Capillary refill takes less than 2 seconds.     Findings: No rash.  Neurological:     Mental Status: She is alert and oriented to person, place, and time.  Psychiatric:        Behavior: Behavior normal.      Vitals:   10/29/22 1349  BP: 130/78  Pulse: 96  SpO2: 96%  Weight: 208 lb (94.3 kg)  Height: 5\' 6"  (1.676 m)   96% on RA BMI Readings from Last 3 Encounters:  10/29/22 33.57 kg/m  10/20/22 33.38 kg/m  08/27/22 33.23 kg/m   Wt Readings from Last 3 Encounters:  10/29/22 208 lb (94.3 kg)  10/20/22 206 lb 12.8 oz (93.8 kg)  08/27/22 209 lb (94.8 kg)     CBC    Component Value Date/Time   WBC 10.0 10/25/2020 1540   RBC 4.60 10/25/2020 1540   HGB 14.3 10/25/2020 1540   HCT 41.7 10/25/2020 1540   PLT 254 10/25/2020 1540   MCV 90.7 10/25/2020 1540   MCH 31.1 10/25/2020 1540   MCHC 34.3 10/25/2020 1540   RDW 12.8 10/25/2020 1540   LYMPHSABS 3,580 10/25/2020 1540   MONOABS 0.5 10/19/2019 1007   EOSABS 320 10/25/2020 1540   BASOSABS 60 10/25/2020 1540     Chest Imaging: January 2024 CT chest: Enlarged 2.5 cm round lesion within the right chest concern for possible pulmonary AVM. The patient's images have  been independently reviewed by me.    January CTA chest: 2.5 cm rounded lesion in the right chest no contrast enhancement not consistent with an AVM probably a proximal nodule. The patient's images have been independently reviewed by me.    Pulmonary Functions Testing Results:     No data to display          FeNO:   Pathology:   Echocardiogram:   Heart Catheterization:      Assessment & Plan:     ICD-10-CM   1. Lung nodule  R91.1 Procedural/ Surgical Case Request: ROBOTIC ASSISTED NAVIGATIONAL BRONCHOSCOPY    Ambulatory referral to Pulmonology    2. Abnormal CT scan, lung  R91.8       Discussion:  This is an 87 year old female seen today for evaluation of abnormal CT imaging.  Had a 2.5 cm density within the right chest on CT.  CTA consistent with a nodule not consistent with a pulmonary AVM.  Plan: In the office we talked about the risk benefits alternatives and consideration for biopsy of the lesion. I explained that were probably dealing with a carcinoid tumor. We discussed the utility of robotic assisted navigational bronchoscopy and tissue sampling. Patient is agreeable to proceed.  Tentative bronchoscopy date will be 11/24/2022.    Current Outpatient Medications:    albuterol (VENTOLIN HFA) 108 (90 Base) MCG/ACT inhaler, Inhale 2 puffs into the lungs every 6 (six) hours as needed., Disp: , Rfl:    ALPRAZolam (XANAX) 0.25 MG tablet, TAKE AS NEEDED FOR ANXIETY., Disp: 30 tablet, Rfl: 0   FARXIGA 5 MG TABS tablet, TAKE 1 TABLET EVERY DAY, Disp: 90 tablet, Rfl: 0   ferrous sulfate 325 (65 FE) MG tablet, Take 1 tablet (325 mg total) by mouth 2 (two) times daily., Disp: , Rfl: 3   glucose blood (ONETOUCH VERIO) test strip, 1 each by Other route as needed for other. And lancets 1/day, Disp: 100 each, Rfl: 12   losartan (COZAAR) 25 MG tablet, TAKE ONE TABLET BY MOUTH DAILY, Disp: 30 tablet, Rfl: 2   Multiple Vitamin (MULTIVITAMIN) tablet, Take 1 tablet by mouth at bedtime., Disp: , Rfl:    pravastatin (PRAVACHOL) 20 MG tablet, Take 1 tablet (20 mg total) by mouth daily., Disp: 90 tablet, Rfl: 1   repaglinide (PRANDIN) 1 MG tablet, Take 1 tablet (1 mg total) by mouth 2 (two) times daily before a meal., Disp: 180 tablet, Rfl: 1   vitamin B-12 (CYANOCOBALAMIN) 1000 MCG tablet, Take 1,000 mcg by mouth 2 (two) times daily. , Disp: , Rfl:    Garner Nash, DO [facility] 10/29/2022 2:17 PM

## 2022-10-29 NOTE — H&P (View-Only) (Signed)
Synopsis: Referred in January 2024 for abnormal CT chest by Inda Coke, PA  Subjective:   PATIENT ID: Marie Rhodes GENDER: female DOB: 09-26-1936, MRN: [id number]  Chief Complaint  Patient presents with   Follow-up    F/up    This is an 87 year old female, past medical history of Crohn's disease, hyperlipidemia, obesity vitamin D deficiency, molar pregnancy.  She had a chest x-ray that showed a abnormality in the right chest. Chest x-ray revealed a perihilar nodule and they evaluated further with CT imaging.  CT imaging completed on 10/08/2022 which was filled a 2.5 centimeter lesion in the right upper lobe adjacent to the right hilum.  He has similar Hounsfield units to the aorta and left appendage.  Due to the Hounsfield unit of the lesion the concern was this being a possible pulmonary AVM.  There is appears to be some draining peripheral veins distally to this lesion.  Felt more to be an AVM versus a true pulmonary nodule.  Therefore request further evaluation and refer to pulmonary.  She also has a thyroid goiter and some mildly enlarged pulmonary arteries.  OV 10/29/2022: Here today for follow-up after CTA imaging.  CTA imaging is more consistent with the lesion being not an AVM there was no contrast-enhancement.  It is concerning that is probably a low-grade malignancy such as a carcinoid.  Here today to review CT imaging.    Past Medical History:  Diagnosis Date   ANXIETY 04/27/2007   CROHN'S DISEASE, SMALL INTESTINE 06/08/2008   ileum, cecum   DEPRESSION 04/27/2007   Fatty tumor    left shoulder   GOITER, MULTINODULAR 05/04/2008   Hyperlipemia    Molar pregnancy 1967   OBESITY 08/01/2010   UNSPECIFIED ANEMIA 05/04/2008   Mild Anemia   Vitamin D deficiency 04/08/2018     Family History  Problem Relation Age of Onset   Breast cancer Mother 62   Kidney disease Mother        Dialysis   Diabetes Mother    Kidney cancer Mother        Renal Cell Carcinoma   Skin cancer  Father        mets   Diabetes Father    Melanoma Sister    Diabetes Brother    Tongue cancer Brother 47   Stroke Maternal Grandmother    Heart disease Paternal Grandmother    Colon cancer Paternal Uncle      Past Surgical History:  Procedure Laterality Date   APPENDECTOMY     BIOPSY THYROID     COLONOSCOPY  08/10/2006   normal   LEFT OOPHORECTOMY  1992   MECKEL DIVERTICULUM EXCISION     Ileal and cecal resection     Social History   Socioeconomic History   Marital status: Widowed    Spouse name: Not on file   Number of children: Not on file   Years of education: Not on file   Highest education level: Not on file  Occupational History   Occupation: retired Pharmacist, hospital    Comment: retired Merchant navy officer)  Tobacco Use   Smoking status: Former    Types: Cigarettes    Quit date: 05/29/1988    Years since quitting: 34.4   Smokeless tobacco: Never  Substance and Sexual Activity   Alcohol use: No   Drug use: No   Sexual activity: Not on file  Other Topics Concern   Not on file  Social History Narrative   Patient is widowed --husband died from  brain aneurysm, she is a retired Pharmacist, 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   Former Research scientist (life sciences) (college only), no alcohol or drug use   Both of her grandchildren lived with her while they went to college    As of November 2019, she lives alone   Social Determinants of Health   Financial Resource Strain: [place]  (02/02/2022)   Overall Financial Resource Strain (CARDIA)    Difficulty of Paying Living Expenses: Not hard at all  Food Insecurity: No Food Insecurity (02/02/2022)   Hunger Vital Sign    Worried About Running Out of Food in the Last Year: Never true    [place] in the Last Year: Never true  Transportation Needs: No Transportation Needs (02/02/2022)   PRAPARE - Hydrologist (Medical): No    Lack of Transportation (Non-Medical): No  Physical Activity:  Inactive (02/02/2022)   Exercise Vital Sign    Days of Exercise per Week: 0 days    Minutes of Exercise per Session: 0 min  Stress: No Stress Concern Present (02/02/2022)   [place]    Feeling of Stress : Not at all  Social Connections: Moderately Isolated (02/02/2022)   Social Connection and Isolation Panel [NHANES]    Frequency of Communication with Friends and Family: More than three times a week    Frequency of Social Gatherings with Friends and Family: More than three times a week    Attends Religious Services: More than 4 times per year    Active Member of [employer] or Organizations: No    Attends Archivist Meetings: Never    Marital Status: Widowed  Intimate Partner Violence: Not At Risk (02/02/2022)   Humiliation, Afraid, Rape, and Kick questionnaire    Fear of Current or Ex-Partner: No    Emotionally Abused: No    Physically Abused: No    Sexually Abused: No     Allergies  Allergen Reactions   Tylenol With Codeine #3 [Acetaminophen-Codeine] Other (See Comments)    Constipation     Outpatient Medications Prior to Visit  Medication Sig Dispense Refill   albuterol (VENTOLIN HFA) 108 (90 Base) MCG/ACT inhaler Inhale 2 puffs into the lungs every 6 (six) hours as needed.     ALPRAZolam (XANAX) 0.25 MG tablet TAKE AS NEEDED FOR ANXIETY. 30 tablet 0   FARXIGA 5 MG TABS tablet TAKE 1 TABLET EVERY DAY 90 tablet 0   ferrous sulfate 325 (65 FE) MG tablet Take 1 tablet (325 mg total) by mouth 2 (two) times daily.  3   glucose blood (ONETOUCH VERIO) test strip 1 each by Other route as needed for other. And lancets 1/day 100 each 12   losartan (COZAAR) 25 MG tablet TAKE ONE TABLET BY MOUTH DAILY 30 tablet 2   Multiple Vitamin (MULTIVITAMIN) tablet Take 1 tablet by mouth at bedtime.     pravastatin (PRAVACHOL) 20 MG tablet Take 1 tablet (20 mg total) by mouth daily. 90 tablet 1   repaglinide (PRANDIN) 1 MG tablet Take  1 tablet (1 mg total) by mouth 2 (two) times daily before a meal. 180 tablet 1   vitamin B-12 (CYANOCOBALAMIN) 1000 MCG tablet Take 1,000 mcg by mouth 2 (two) times daily.      No facility-administered medications prior to visit.    Review of Systems  Constitutional:  Negative for chills, fever, malaise/fatigue and weight  loss.  HENT:  Negative for hearing loss, sore throat and tinnitus.   Eyes:  Negative for blurred vision and double vision.  Respiratory:  Negative for cough, hemoptysis, sputum production, shortness of breath, wheezing and stridor.   Cardiovascular:  Negative for chest pain, palpitations, orthopnea, leg swelling and PND.  Gastrointestinal:  Negative for abdominal pain, constipation, diarrhea, heartburn, nausea and vomiting.  Genitourinary:  Negative for dysuria, hematuria and urgency.  Musculoskeletal:  Negative for joint pain and myalgias.  Skin:  Negative for itching and rash.  Neurological:  Negative for dizziness, tingling, weakness and headaches.  Endo/Heme/Allergies:  Negative for environmental allergies. Does not bruise/bleed easily.  Psychiatric/Behavioral:  Negative for depression. The patient is not nervous/anxious and does not have insomnia.   All other systems reviewed and are negative.    Objective:  Physical Exam Vitals reviewed.  Constitutional:      General: She is not in acute distress.    Appearance: She is well-developed.  HENT:     Head: Normocephalic and atraumatic.  Eyes:     General: No scleral icterus.    Conjunctiva/sclera: Conjunctivae normal.     Pupils: Pupils are equal, round, and reactive to light.  Neck:     Vascular: No JVD.     Trachea: No tracheal deviation.  Cardiovascular:     Rate and Rhythm: Normal rate and regular rhythm.     Heart sounds: Normal heart sounds. No murmur heard. Pulmonary:     Effort: Pulmonary effort is normal. No tachypnea, accessory muscle usage or respiratory distress.     Breath sounds: No stridor.  No wheezing, rhonchi or rales.  Abdominal:     General: There is no distension.     Palpations: Abdomen is soft.     Tenderness: There is no abdominal tenderness.  Musculoskeletal:        General: No tenderness.     Cervical back: Neck supple.  Lymphadenopathy:     Cervical: No cervical adenopathy.  Skin:    General: Skin is warm and dry.     Capillary Refill: Capillary refill takes less than 2 seconds.     Findings: No rash.  Neurological:     Mental Status: She is alert and oriented to person, place, and time.  Psychiatric:        Behavior: Behavior normal.      Vitals:   10/29/22 1349  BP: 130/78  Pulse: 96  SpO2: 96%  Weight: 208 lb (94.3 kg)  Height: 5\' 6"  (1.676 m)   96% on RA BMI Readings from Last 3 Encounters:  10/29/22 33.57 kg/m  10/20/22 33.38 kg/m  08/27/22 33.23 kg/m   Wt Readings from Last 3 Encounters:  10/29/22 208 lb (94.3 kg)  10/20/22 206 lb 12.8 oz (93.8 kg)  08/27/22 209 lb (94.8 kg)     CBC    Component Value Date/Time   WBC 10.0 10/25/2020 1540   RBC 4.60 10/25/2020 1540   HGB 14.3 10/25/2020 1540   HCT 41.7 10/25/2020 1540   PLT 254 10/25/2020 1540   MCV 90.7 10/25/2020 1540   MCH 31.1 10/25/2020 1540   MCHC 34.3 10/25/2020 1540   RDW 12.8 10/25/2020 1540   LYMPHSABS 3,580 10/25/2020 1540   MONOABS 0.5 10/19/2019 1007   EOSABS 320 10/25/2020 1540   BASOSABS 60 10/25/2020 1540     Chest Imaging: January 2024 CT chest: Enlarged 2.5 cm round lesion within the right chest concern for possible pulmonary AVM. The patient's images have  been independently reviewed by me.    January CTA chest: 2.5 cm rounded lesion in the right chest no contrast enhancement not consistent with an AVM probably a proximal nodule. The patient's images have been independently reviewed by me.    Pulmonary Functions Testing Results:     No data to display          FeNO:   Pathology:   Echocardiogram:   Heart Catheterization:      Assessment & Plan:     ICD-10-CM   1. Lung nodule  R91.1 Procedural/ Surgical Case Request: ROBOTIC ASSISTED NAVIGATIONAL BRONCHOSCOPY    Ambulatory referral to Pulmonology    2. Abnormal CT scan, lung  R91.8       Discussion:  This is an 87 year old female seen today for evaluation of abnormal CT imaging.  Had a 2.5 cm density within the right chest on CT.  CTA consistent with a nodule not consistent with a pulmonary AVM.  Plan: In the office we talked about the risk benefits alternatives and consideration for biopsy of the lesion. I explained that were probably dealing with a carcinoid tumor. We discussed the utility of robotic assisted navigational bronchoscopy and tissue sampling. Patient is agreeable to proceed.  Tentative bronchoscopy date will be 11/24/2022.    Current Outpatient Medications:    albuterol (VENTOLIN HFA) 108 (90 Base) MCG/ACT inhaler, Inhale 2 puffs into the lungs every 6 (six) hours as needed., Disp: , Rfl:    ALPRAZolam (XANAX) 0.25 MG tablet, TAKE AS NEEDED FOR ANXIETY., Disp: 30 tablet, Rfl: 0   FARXIGA 5 MG TABS tablet, TAKE 1 TABLET EVERY DAY, Disp: 90 tablet, Rfl: 0   ferrous sulfate 325 (65 FE) MG tablet, Take 1 tablet (325 mg total) by mouth 2 (two) times daily., Disp: , Rfl: 3   glucose blood (ONETOUCH VERIO) test strip, 1 each by Other route as needed for other. And lancets 1/day, Disp: 100 each, Rfl: 12   losartan (COZAAR) 25 MG tablet, TAKE ONE TABLET BY MOUTH DAILY, Disp: 30 tablet, Rfl: 2   Multiple Vitamin (MULTIVITAMIN) tablet, Take 1 tablet by mouth at bedtime., Disp: , Rfl:    pravastatin (PRAVACHOL) 20 MG tablet, Take 1 tablet (20 mg total) by mouth daily., Disp: 90 tablet, Rfl: 1   repaglinide (PRANDIN) 1 MG tablet, Take 1 tablet (1 mg total) by mouth 2 (two) times daily before a meal., Disp: 180 tablet, Rfl: 1   vitamin B-12 (CYANOCOBALAMIN) 1000 MCG tablet, Take 1,000 mcg by mouth 2 (two) times daily. , Disp: , Rfl:    Garner Nash, DO [facility] 10/29/2022 2:17 PM

## 2022-11-03 ENCOUNTER — Ambulatory Visit
Admission: RE | Admit: 2022-11-03 | Discharge: 2022-11-03 | Disposition: A | Discharge status: Home / Self Care | Payer: Medicare PPO | Source: Ambulatory Visit | Attending: Physician Assistant | Admitting: Physician Assistant

## 2022-11-03 DIAGNOSIS — R9389 Abnormal findings on diagnostic imaging of other specified body structures: Secondary | ICD-10-CM

## 2022-11-03 DIAGNOSIS — E042 Nontoxic multinodular goiter: Secondary | ICD-10-CM | POA: Diagnosis not present

## 2022-11-05 ENCOUNTER — Other Ambulatory Visit: Payer: Self-pay | Admitting: *Deleted

## 2022-11-05 DIAGNOSIS — E042 Nontoxic multinodular goiter: Secondary | ICD-10-CM

## 2022-11-05 DIAGNOSIS — R9389 Abnormal findings on diagnostic imaging of other specified body structures: Secondary | ICD-10-CM

## 2022-11-09 ENCOUNTER — Other Ambulatory Visit (INDEPENDENT_AMBULATORY_CARE_PROVIDER_SITE_OTHER): Payer: Medicare PPO

## 2022-11-09 ENCOUNTER — Other Ambulatory Visit: Payer: Self-pay | Admitting: Physician Assistant

## 2022-11-09 DIAGNOSIS — K5000 Crohn's disease of small intestine without complications: Secondary | ICD-10-CM

## 2022-11-09 LAB — T3, FREE: T3, Free: 2.8 pg/mL (ref 2.3–4.2)

## 2022-11-09 LAB — T4, FREE: Free T4: 0.88 ng/dL (ref 0.60–1.60)

## 2022-11-20 ENCOUNTER — Encounter (HOSPITAL_COMMUNITY): Payer: Self-pay | Admitting: Pulmonary Disease

## 2022-11-20 ENCOUNTER — Telehealth: Payer: Self-pay | Admitting: Pulmonary Disease

## 2022-11-20 ENCOUNTER — Other Ambulatory Visit: Payer: Medicare PPO

## 2022-11-20 DIAGNOSIS — R911 Solitary pulmonary nodule: Secondary | ICD-10-CM | POA: Diagnosis not present

## 2022-11-20 NOTE — Telephone Encounter (Signed)
Dr. Valeta Harms, please advise on this for pt.

## 2022-11-20 NOTE — Telephone Encounter (Signed)
PT was told to stop medication called *Prandone the Monday  before the upcoming surgery. In looking at her papers she now sees it was to be stopped two weeks before. Last one taken this AM, 11/20/2022 Please advise if this is OK by calling her @ [phone]    *repaglinide (PRANDIN) 1 MG tablet [id number]

## 2022-11-22 LAB — NOVEL CORONAVIRUS, NAA: SARS-CoV-2, NAA: NOT DETECTED

## 2022-11-23 ENCOUNTER — Other Ambulatory Visit: Payer: Self-pay

## 2022-11-23 ENCOUNTER — Encounter (HOSPITAL_COMMUNITY): Payer: Self-pay | Admitting: Pulmonary Disease

## 2022-11-23 NOTE — Telephone Encounter (Signed)
Called and spoke with pt letting her know the info per Dr. Valeta Harms and she verbalized understanding stating that she had stopped  her prandin. Nothing further needed.

## 2022-11-23 NOTE — Progress Notes (Addendum)
Spoke with pt for pre-op call. Pt denies cardiac history. Hx of HTN and Diabetes. Pt's last A1C was 6.6 on 07/27/22. States she does not check her blood sugar at home. Pt's last dose of Farxiga was 01/19/23 AM dose. Pt has already held her Prandin prior to the procedure.  Pt states she was diagnosed with Bronchitis in November, 2023 and states she had a cough up until 2 weeks ago. Has not had a cough since then. Spoke with Karoline Caldwell, PA and he is aware of this and states it should not be a problem due to the fact that Bronchitis can cause coughing for weeks after.   Shower instructions given to pt and she voiced understanding.

## 2022-11-24 ENCOUNTER — Encounter (HOSPITAL_COMMUNITY)
Admission: RE | Disposition: A | Discharge status: Home / Self Care | Payer: Self-pay | Source: Home / Self Care | Attending: Pulmonary Disease

## 2022-11-24 ENCOUNTER — Ambulatory Visit (HOSPITAL_COMMUNITY): Payer: Medicare PPO

## 2022-11-24 ENCOUNTER — Ambulatory Visit (HOSPITAL_BASED_OUTPATIENT_CLINIC_OR_DEPARTMENT_OTHER): Payer: Medicare PPO | Admitting: Certified Registered Nurse Anesthetist

## 2022-11-24 ENCOUNTER — Encounter (HOSPITAL_COMMUNITY): Payer: Self-pay | Admitting: Pulmonary Disease

## 2022-11-24 ENCOUNTER — Ambulatory Visit (HOSPITAL_COMMUNITY)
Admission: RE | Admit: 2022-11-24 | Discharge: 2022-11-24 | Disposition: A | Discharge status: Home / Self Care | Payer: Medicare PPO | Attending: Pulmonary Disease | Admitting: Pulmonary Disease

## 2022-11-24 ENCOUNTER — Ambulatory Visit (HOSPITAL_COMMUNITY): Payer: Medicare PPO | Admitting: Certified Registered Nurse Anesthetist

## 2022-11-24 DIAGNOSIS — C322 Malignant neoplasm of subglottis: Secondary | ICD-10-CM | POA: Diagnosis not present

## 2022-11-24 DIAGNOSIS — E119 Type 2 diabetes mellitus without complications: Secondary | ICD-10-CM | POA: Insufficient documentation

## 2022-11-24 DIAGNOSIS — I700 Atherosclerosis of aorta: Secondary | ICD-10-CM | POA: Diagnosis not present

## 2022-11-24 DIAGNOSIS — C7A090 Malignant carcinoid tumor of the bronchus and lung: Secondary | ICD-10-CM | POA: Insufficient documentation

## 2022-11-24 DIAGNOSIS — R911 Solitary pulmonary nodule: Secondary | ICD-10-CM

## 2022-11-24 DIAGNOSIS — F419 Anxiety disorder, unspecified: Secondary | ICD-10-CM | POA: Diagnosis not present

## 2022-11-24 DIAGNOSIS — I452 Bifascicular block: Secondary | ICD-10-CM | POA: Diagnosis not present

## 2022-11-24 DIAGNOSIS — M47814 Spondylosis without myelopathy or radiculopathy, thoracic region: Secondary | ICD-10-CM | POA: Insufficient documentation

## 2022-11-24 DIAGNOSIS — C342 Malignant neoplasm of middle lobe, bronchus or lung: Secondary | ICD-10-CM

## 2022-11-24 DIAGNOSIS — I10 Essential (primary) hypertension: Secondary | ICD-10-CM | POA: Insufficient documentation

## 2022-11-24 DIAGNOSIS — Z79899 Other long term (current) drug therapy: Secondary | ICD-10-CM | POA: Diagnosis not present

## 2022-11-24 DIAGNOSIS — F32A Depression, unspecified: Secondary | ICD-10-CM | POA: Diagnosis not present

## 2022-11-24 DIAGNOSIS — Z87891 Personal history of nicotine dependence: Secondary | ICD-10-CM | POA: Insufficient documentation

## 2022-11-24 HISTORY — DX: Malignant (primary) neoplasm, unspecified: C80.1

## 2022-11-24 HISTORY — DX: Essential (primary) hypertension: I10

## 2022-11-24 HISTORY — DX: Chronic kidney disease, unspecified: N18.9

## 2022-11-24 HISTORY — PX: BRONCHIAL NEEDLE ASPIRATION BIOPSY: SHX5106

## 2022-11-24 HISTORY — DX: Pneumonia, unspecified organism: J18.9

## 2022-11-24 HISTORY — DX: Nontoxic single thyroid nodule: E04.1

## 2022-11-24 HISTORY — DX: Type 2 diabetes mellitus without complications: E11.9

## 2022-11-24 HISTORY — DX: Unspecified osteoarthritis, unspecified site: M19.90

## 2022-11-24 HISTORY — PX: BRONCHIAL BRUSHINGS: SHX5108

## 2022-11-24 HISTORY — PX: BRONCHIAL BIOPSY: SHX5109

## 2022-11-24 LAB — BASIC METABOLIC PANEL
Anion gap: 11 (ref 5–15)
BUN: 22 mg/dL (ref 8–23)
CO2: 21 mmol/L — ABNORMAL LOW (ref 22–32)
Calcium: 10.1 mg/dL (ref 8.9–10.3)
Chloride: 107 mmol/L (ref 98–111)
Creatinine, Ser: 1.27 mg/dL — ABNORMAL HIGH (ref 0.44–1.00)
GFR, Estimated: 42 mL/min — ABNORMAL LOW (ref >60)
Glucose, Bld: 186 mg/dL — ABNORMAL HIGH (ref 70–99)
Potassium: 4.3 mmol/L (ref 3.5–5.1)
Sodium: 139 mmol/L (ref 135–145)

## 2022-11-24 LAB — CBC
HCT: 42.6 % (ref 36.0–46.0)
Hemoglobin: 14.7 g/dL (ref 12.0–15.0)
MCH: 31.9 pg (ref 26.0–34.0)
MCHC: 34.5 g/dL (ref 30.0–36.0)
MCV: 92.4 fL (ref 80.0–100.0)
Platelets: 194 10*3/uL (ref 150–400)
RBC: 4.61 MIL/uL (ref 3.87–5.11)
RDW: 12 % (ref 11.5–15.5)
WBC: 7.9 10*3/uL (ref 4.0–10.5)
nRBC: 0 % (ref 0.0–0.2)

## 2022-11-24 LAB — GLUCOSE, CAPILLARY
Glucose-Capillary: 173 mg/dL — ABNORMAL HIGH (ref 70–99)
Glucose-Capillary: 165 mg/dL — ABNORMAL HIGH (ref 70–99)

## 2022-11-24 SURGERY — BRONCHOSCOPY, WITH BIOPSY USING ELECTROMAGNETIC NAVIGATION
Anesthesia: General | Laterality: Right

## 2022-11-24 MED ORDER — CHLORHEXIDINE GLUCONATE 0.12 % MT SOLN: 15.0000 mL | Freq: Once | OROMUCOSAL | Status: DC

## 2022-11-24 MED ORDER — SUGAMMADEX SODIUM 200 MG/2ML IV SOLN
INTRAVENOUS | Status: DC | PRN
  Administered 2022-11-24: 200 mg via INTRAVENOUS

## 2022-11-24 MED ORDER — PROPOFOL 10 MG/ML IV BOLUS
INTRAVENOUS | Status: DC | PRN
  Administered 2022-11-24: 150 mg via INTRAVENOUS

## 2022-11-24 MED ORDER — FENTANYL CITRATE (PF) 100 MCG/2ML IJ SOLN
INTRAMUSCULAR | Status: DC | PRN
  Administered 2022-11-24 (×2): 50 ug via INTRAVENOUS

## 2022-11-24 MED ORDER — ONDANSETRON HCL 4 MG/2ML IJ SOLN: 4.0000 mg | Freq: Once | INTRAMUSCULAR | Status: DC | PRN

## 2022-11-24 MED ORDER — IBUPROFEN 200 MG PO TABS: 200.0000 mg | ORAL_TABLET | Freq: Four times a day (QID) | ORAL | Status: DC | PRN

## 2022-11-24 MED ORDER — FENTANYL CITRATE (PF) 250 MCG/5ML IJ SOLN: INTRAMUSCULAR | Status: DC | PRN

## 2022-11-24 MED ORDER — ROCURONIUM BROMIDE 10 MG/ML (PF) SYRINGE
PREFILLED_SYRINGE | INTRAVENOUS | Status: DC | PRN
  Administered 2022-11-24: 10 mg via INTRAVENOUS
  Administered 2022-11-24: 50 mg via INTRAVENOUS

## 2022-11-24 MED ORDER — IBUPROFEN 100 MG/5ML PO SUSP: 200.0000 mg | Freq: Four times a day (QID) | ORAL | Status: DC | PRN

## 2022-11-24 MED ORDER — OXYCODONE HCL 5 MG/5ML PO SOLN: 5.0000 mg | Freq: Once | ORAL | Status: DC | PRN

## 2022-11-24 MED ORDER — LACTATED RINGERS IV SOLN: INTRAVENOUS | Status: DC

## 2022-11-24 MED ORDER — ONDANSETRON HCL 4 MG/2ML IJ SOLN
INTRAMUSCULAR | Status: DC | PRN
  Administered 2022-11-24: 4 mg via INTRAVENOUS

## 2022-11-24 MED ORDER — PROPOFOL 500 MG/50ML IV EMUL
INTRAVENOUS | Status: DC | PRN
  Administered 2022-11-24: 125 ug/kg/min via INTRAVENOUS

## 2022-11-24 MED ORDER — PHENYLEPHRINE HCL-NACL 20-0.9 MG/250ML-% IV SOLN
INTRAVENOUS | Status: DC | PRN
  Administered 2022-11-24: 30 ug/min via INTRAVENOUS

## 2022-11-24 MED ORDER — LIDOCAINE 2% (20 MG/ML) 5 ML SYRINGE
INTRAMUSCULAR | Status: DC | PRN
  Administered 2022-11-24: 100 mg via INTRAVENOUS

## 2022-11-24 MED ORDER — DEXAMETHASONE SODIUM PHOSPHATE 10 MG/ML IJ SOLN
INTRAMUSCULAR | Status: DC | PRN
  Administered 2022-11-24: 5 mg via INTRAVENOUS

## 2022-11-24 MED ORDER — PHENYLEPHRINE 80 MCG/ML (10ML) SYRINGE FOR IV PUSH (FOR BLOOD PRESSURE SUPPORT)
PREFILLED_SYRINGE | INTRAVENOUS | Status: DC | PRN
  Administered 2022-11-24: 160 ug via INTRAVENOUS

## 2022-11-24 MED ORDER — OXYCODONE HCL 5 MG PO TABS: 5.0000 mg | ORAL_TABLET | Freq: Once | ORAL | Status: DC | PRN

## 2022-11-24 MED ORDER — FENTANYL CITRATE (PF) 100 MCG/2ML IJ SOLN: 25.0000 ug | INTRAMUSCULAR | Status: DC | PRN

## 2022-11-24 NOTE — Op Note (Addendum)
Video Bronchoscopy with Robotic Assisted Bronchoscopic Navigation   Date of Operation: 11/24/2022   Pre-op Diagnosis: Right middle lobe lung nodule  Post-op Diagnosis: Right middle lobe lung nodule  Surgeon: Garner Nash, DO  Assistants: None   Anesthesia: General endotracheal anesthesia  Operation: Flexible video fiberoptic bronchoscopy with robotic assistance and biopsies.  Estimated Blood Loss: Minimal  Complications: None  Indications and History: Marie Rhodes is a 87 y.o. female with history of right middle lobe lung nodule. The risks, benefits, complications, treatment options and expected outcomes were discussed with the patient.  The possibilities of pneumothorax, pneumonia, reaction to medication, pulmonary aspiration, perforation of a viscus, bleeding, failure to diagnose a condition and creating a complication requiring transfusion or operation were discussed with the patient who freely signed the consent.    Description of Procedure: The patient was seen in the Preoperative Area, was examined and was deemed appropriate to proceed.  The patient was taken to [facility] endoscopy room 3, identified as Marie Rhodes and the procedure verified as Flexible Video Fiberoptic Bronchoscopy.  A Time Out was held and the above information confirmed.   Prior to the date of the procedure a high-resolution CT scan of the chest was performed. Utilizing ION software program a virtual tracheobronchial tree was generated to allow the creation of distinct navigation pathways to the patient's parenchymal abnormalities. After being taken to the operating room general anesthesia was initiated and the patient  was orally intubated. The video fiberoptic bronchoscope was introduced via the endotracheal tube and a general inspection was performed which showed normal right and left lung anatomy, aspiration of the bilateral mainstems was completed to remove any remaining secretions. Robotic catheter inserted  into patient's endotracheal tube.   Target #1 right middle lobe lung nodule: The distinct navigation pathways prepared prior to this procedure were then utilized to navigate to patient's lesion identified on CT scan. The robotic catheter was secured into place and the vision probe was withdrawn.  Lesion location was approximated using fluoroscopy and three-dimensional cone beam CT imaging for CT-guided needle placement and peripheral targeting. Under fluoroscopic guidance transbronchial needle brushings, transbronchial needle biopsies, and transbronchial forceps biopsies were performed to be sent for cytology and pathology.   At the end of the procedure a general airway inspection was performed and there was no evidence of active bleeding. The bronchoscope was removed.  The patient tolerated the procedure well. There was no significant blood loss and there were no obvious complications. A post-procedural chest x-ray is pending.  Samples Target #1: 1. Transbronchial needle brushings from right middle lobe 2. Transbronchial Wang needle biopsies from right middle lobe 3. Transbronchial forceps biopsies from right middle lobe  Plans:  The patient will be discharged from the PACU to home when recovered from anesthesia and after chest x-ray is reviewed. We will review the cytology, pathology results with the patient when they become available. Outpatient followup will be with Garner Nash, DO.  Garner Nash, DO [facility] 11/24/2022 8:27 AM

## 2022-11-24 NOTE — Anesthesia Preprocedure Evaluation (Signed)
Anesthesia Evaluation  Patient identified by MRN, date of birth, ID band Patient awake    Reviewed: Allergy & Precautions, NPO status , Patient's Chart, lab work & pertinent test results  Airway Mallampati: I       Dental  (+) Poor Dentition, Missing,    Pulmonary former smoker   Pulmonary exam normal        Cardiovascular hypertension, Pt. on medications Normal cardiovascular exam     Neuro/Psych  PSYCHIATRIC DISORDERS Anxiety Depression       GI/Hepatic   Endo/Other  diabetes    Renal/GU   negative genitourinary   Musculoskeletal   Abdominal Normal abdominal exam  (+)   Peds  Hematology negative hematology ROS (+)   Anesthesia Other Findings   Reproductive/Obstetrics                              Anesthesia Physical Anesthesia Plan  ASA: 2  Anesthesia Plan: General   Post-op Pain Management:    Induction: Intravenous  PONV Risk Score and Plan:   Airway Management Planned: Oral ETT  Additional Equipment: None  Intra-op Plan:   Post-operative Plan: Extubation in OR  Informed Consent: I have reviewed the patients History and Physical, chart, labs and discussed the procedure including the risks, benefits and alternatives for the proposed anesthesia with the patient or authorized representative who has indicated his/her understanding and acceptance.     Dental advisory given  Plan Discussed with: CRNA  Anesthesia Plan Comments:          Anesthesia Quick Evaluation

## 2022-11-24 NOTE — Interval H&P Note (Signed)
History and Physical Interval Note:  11/24/2022 7:25 AM  Marie Rhodes  has presented today for surgery, with the diagnosis of lung nodule.  The various methods of treatment have been discussed with the patient and family. After consideration of risks, benefits and other options for treatment, the patient has consented to  Procedure(s): ROBOTIC ASSISTED NAVIGATIONAL BRONCHOSCOPY (Right) as a surgical intervention.  The patient's history has been reviewed, patient examined, no change in status, stable for surgery.  I have reviewed the patient's chart and labs.  Questions were answered to the patient's satisfaction.     [place]

## 2022-11-24 NOTE — Anesthesia Procedure Notes (Signed)
Procedure Name: Intubation Date/Time: 11/24/2022 7:46 AM  Performed by: Dorthea Cove, CRNAPre-anesthesia Checklist: Patient identified, Emergency Drugs available, Suction available and Patient being monitored Patient Re-evaluated:Patient Re-evaluated prior to induction Oxygen Delivery Method: Circle system utilized Preoxygenation: Pre-oxygenation with 100% oxygen Induction Type: IV induction Ventilation: Mask ventilation without difficulty Laryngoscope Size: Mac and 3 Grade View: Grade II Tube type: Oral Tube size: 8.5 mm Number of attempts: 1 Airway Equipment and Method: Stylet and Oral airway Placement Confirmation: ETT inserted through vocal cords under direct vision, positive ETCO2 and breath sounds checked- equal and bilateral Secured at: 22 cm Tube secured with: Tape Dental Injury: Teeth and Oropharynx as per pre-operative assessment

## 2022-11-24 NOTE — Anesthesia Postprocedure Evaluation (Signed)
Anesthesia Post Note  Patient: Marie Rhodes  Procedure(s) Performed: ROBOTIC ASSISTED NAVIGATIONAL BRONCHOSCOPY (Right) BRONCHIAL BIOPSIES BRONCHIAL NEEDLE ASPIRATION BIOPSIES BRONCHIAL BRUSHINGS     Patient location during evaluation: PACU Anesthesia Type: General Level of consciousness: awake Pain management: pain level controlled Vital Signs Assessment: post-procedure vital signs reviewed and stable Respiratory status: spontaneous breathing Cardiovascular status: stable Postop Assessment: no apparent nausea or vomiting Anesthetic complications: no  No notable events documented.  Last Vitals:  Vitals:   11/24/22 0915 11/24/22 0920  BP: (!) 119/54   Pulse: 69 70  Resp: 14 14  Temp:    SpO2: 91% 92%    Last Pain:  Vitals:   11/24/22 0900  TempSrc:   PainSc: 0-No pain   Pain Goal:                   Huston Foley

## 2022-11-24 NOTE — Discharge Instructions (Signed)

## 2022-11-24 NOTE — Transfer of Care (Signed)
Immediate Anesthesia Transfer of Care Note  Patient: Marie Rhodes  Procedure(s) Performed: ROBOTIC ASSISTED NAVIGATIONAL BRONCHOSCOPY (Right) BRONCHIAL BIOPSIES BRONCHIAL NEEDLE ASPIRATION BIOPSIES BRONCHIAL BRUSHINGS  Patient Location: PACU  Anesthesia Type:General  Level of Consciousness: awake, alert , and oriented  Airway & Oxygen Therapy: Patient Spontanous Breathing and Patient connected to nasal cannula oxygen  Post-op Assessment: Report given to RN and Post -op Vital signs reviewed and stable  Post vital signs: Reviewed and stable  Last Vitals:  Vitals Value Taken Time  BP 102/44 11/24/22 0834  Temp    Pulse 75 11/24/22 0835  Resp 12 11/24/22 0835  SpO2 93 % 11/24/22 0835  Vitals shown include unvalidated device data.  Last Pain:  Vitals:   11/24/22 0635  TempSrc:   PainSc: 0-No pain         Complications: No notable events documented.

## 2022-11-25 ENCOUNTER — Encounter (HOSPITAL_COMMUNITY): Payer: Self-pay | Admitting: Pulmonary Disease

## 2022-11-26 ENCOUNTER — Other Ambulatory Visit: Payer: Self-pay | Admitting: Physician Assistant

## 2022-11-26 NOTE — Telephone Encounter (Signed)
Pt requesting refill for Alprazolam 0.25 mg tablet. Last OV 08/27/2022.

## 2022-11-27 LAB — CYTOLOGY - NON PAP

## 2022-12-01 ENCOUNTER — Telehealth: Payer: Self-pay | Admitting: Adult Health

## 2022-12-01 ENCOUNTER — Encounter: Payer: Self-pay | Admitting: Adult Health

## 2022-12-01 ENCOUNTER — Ambulatory Visit (INDEPENDENT_AMBULATORY_CARE_PROVIDER_SITE_OTHER): Payer: Medicare PPO | Admitting: Adult Health

## 2022-12-01 VITALS — BP 100/60 | HR 94 | Temp 97.8°F | Ht 66.0 in | Wt 206.8 lb

## 2022-12-01 DIAGNOSIS — C7A090 Malignant carcinoid tumor of the bronchus and lung: Secondary | ICD-10-CM | POA: Diagnosis not present

## 2022-12-01 DIAGNOSIS — D3A00 Benign carcinoid tumor of unspecified site: Secondary | ICD-10-CM | POA: Insufficient documentation

## 2022-12-01 NOTE — Telephone Encounter (Signed)
Pt was returning Tammy call

## 2022-12-01 NOTE — Telephone Encounter (Signed)
Pt was ret Tammy's call, she said. 2nd call today. Pls call @ [phone]

## 2022-12-01 NOTE — Assessment & Plan Note (Addendum)
Right perihilar nodule measuring up to 2.6 cm status post navigational bronchoscopy November 24, 2022 with cytology Positive malignant cells consistent with carcinoid tumor. Case discussed in detail with Dr. Valeta Harms.  Patient will proceed with referral to radiation oncology for SBRT We reached out to radiation oncology and they are make an appointment for her to be seen next week. I will reach out to patient to discuss the above treatment plan

## 2022-12-01 NOTE — Progress Notes (Signed)
$'@Patient'w$  ID: Marie Rhodes, female    DOB: Nov 16, 1935, 87 y.o.   MRN: [id number]  Chief Complaint  Patient presents with   Follow-up    Referring provider: Inda Coke, [place]  HPI: 87 year old female former smoker seen for right sided lung nodule first noted August 31, 2022 on chest x-ray imaging Medical history significant for diabetes, Crohn's disease, chronic kidney disease  TEST/EVENTS :  CT chest October 08, 2022 2.5 cm lesion in the right upper lobe adjacent to the right hilum, no mediastinal or hilar adenopathy, emphysematous changes and pulmonary scarring  CT chest angio October 26, 2022 negative PE, diffuse bronchial wall thickening, bibasilar scarring, rounded enhancing nodule in the perihilar right middle lobe appears to occlude the lateral segment bronchus with endobronchial impaction distantly in the lateral segment measuring 2.6 x 2.2 cm, no adenopathy.  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  Patient was referred to our pulmonary clinic October 20, 2022 for abnormal lung nodule noted on CT chest.  As above a 2.5 cm right upper lobe nodule was noted.  A repeat CT angio chest was completed on January night that showed a 2.6 x 2.2 cm perihilar right middle lobe nodule.  Patient underwent navigational bronchoscopy on November 24, 2022.  She tolerated procedure well with no known difficulties.  Cytology was positive for malignant cells consistent with carcinoid.  Patient is fully independent.  Lives at home.  Her grandson has recently moved in with her a few months ago.  She works part-time at the [employer] 1 day a week.  She does her household chores and shopping.  She drives.  She has no known underlying lung problem.  Does have a former smoking history.  She does have Crohn's disease with a previous resection in 1992 and is on no maintenance medications for Crohn's.  She does have underlying diabetes, chronic kidney disease.   She denies any cough, hemoptysis, unintentional weight loss or shortness of breath.   Allergies  Allergen Reactions   Tylenol With Codeine #3 [Acetaminophen-Codeine] Other (See Comments)    Constipation    Immunization History  Administered Date(s) Administered   Fluad Quad(high Dose 65+) 06/16/2019, 06/17/2020, 07/11/2021, 07/27/2022   Influenza Split 09/10/2011   Influenza, High Dose Seasonal PF 08/15/2018   Influenza,inj,Quad PF,6+ Mos 10/08/2017   Pneumococcal Conjugate-13 01/22/2015   Pneumococcal Polysaccharide-23 01/26/2006   Td 01/26/2005   Tdap 10/10/2017   Zoster, Live 01/22/2015    Past Medical History:  Diagnosis Date   ANXIETY 04/27/2007   Arthritis    right big toe   Cancer ([place])    early stage squamous cell - leg   Chronic kidney disease    Stage 3 b - now stage 3 A   COVID 2020   high fever -   CROHN'S DISEASE, SMALL INTESTINE 06/08/2008   ileum, cecum   Diabetes mellitus without complication ([place])    Fatty liver 1992   never had problems with it   Fatty tumor    left shoulder   GOITER, MULTINODULAR 05/04/2008   Hyperlipemia    Hypertension    Molar pregnancy 1967   OBESITY 08/01/2010   Pneumonia    Thyroid nodule    UNSPECIFIED ANEMIA 05/04/2008   Mild Anemia   Vitamin D deficiency 04/08/2018    Tobacco History: Social History   Tobacco Use  Smoking Status Former   Types: Cigarettes   Quit date: 05/29/1988   Years  since quitting: 34.5   Passive exposure: Past  Smokeless Tobacco Never   Counseling given: Not Answered   Outpatient Medications Prior to Visit  Medication Sig Dispense Refill   acetaminophen (TYLENOL) 500 MG tablet Take 1,000 mg by mouth every 6 (six) hours as needed for moderate pain.     ALPRAZolam (XANAX) 0.25 MG tablet Take 1 tablet (0.25 mg total) by mouth at bedtime as needed for anxiety. 30 tablet 0   alum & mag hydroxide-simeth (MAALOX/MYLANTA) 200-200-20 MG/5ML suspension Take 15 mLs by mouth every 6 (six) hours  as needed for indigestion or heartburn.     ASPERCREME LIDOCAINE EX Apply 1 Application topically daily as needed (pain).     cholecalciferol (VITAMIN D3) 25 MCG (1000 UNIT) tablet Take 1,000 Units by mouth daily.     FARXIGA 5 MG TABS tablet TAKE 1 TABLET EVERY DAY 90 tablet 0   ferrous sulfate 325 (65 FE) MG tablet Take 1 tablet (325 mg total) by mouth 2 (two) times daily.  3   glucose blood (ONETOUCH VERIO) test strip 1 each by Other route as needed for other. And lancets 1/day 100 each 12   lidocaine (ASPERCREME LIDOCAINE) 4 % Place 1 patch onto the skin every Wednesday.     losartan (COZAAR) 25 MG tablet TAKE ONE TABLET BY MOUTH DAILY 30 tablet 5   Multiple Vitamin (MULTIVITAMIN) tablet Take 1 tablet by mouth at bedtime.     pravastatin (PRAVACHOL) 20 MG tablet Take 1 tablet (20 mg total) by mouth daily. 90 tablet 1   repaglinide (PRANDIN) 1 MG tablet Take 1 tablet (1 mg total) by mouth 2 (two) times daily before a meal. 180 tablet 1   vitamin B-12 (CYANOCOBALAMIN) 1000 MCG tablet Take 1,000 mcg by mouth daily.     No facility-administered medications prior to visit.     Review of Systems:   Constitutional:   No  weight loss, night sweats,  Fevers, chills, fatigue, or  lassitude.  HEENT:   No headaches,  Difficulty swallowing,  Tooth/dental problems, or  Sore throat,                No sneezing, itching, ear ache, nasal congestion, post nasal drip,   CV:  No chest pain,  Orthopnea, PND, swelling in lower extremities, anasarca, dizziness, palpitations, syncope.   GI  No heartburn, indigestion, abdominal pain, nausea, vomiting, diarrhea, change in bowel habits, loss of appetite, bloody stools.   Resp: No shortness of breath with exertion or at rest.  No excess mucus, no productive cough,  No non-productive cough,  No coughing up of blood.  No change in color of mucus.  No wheezing.  No chest wall deformity  Skin: no rash or lesions.  GU: no dysuria, change in color of urine, no  urgency or frequency.  No flank pain, no hematuria   MS:  No joint pain or swelling.  No decreased range of motion.  No back pain.    Physical Exam  BP 100/60 (BP Location: Left Arm, Patient Position: Sitting, Cuff Size: Large)   Pulse 94   Temp 97.8 F (36.6 C) (Oral)   Ht '5\' 6"'$  (1.676 m)   Wt 206 lb 12.8 oz (93.8 kg)   SpO2 95%   BMI 33.38 kg/m   GEN: A/Ox3; pleasant , NAD, well nourished    HEENT:  [place]/AT,   NOSE-clear, THROAT-clear, no lesions, no postnasal drip or exudate noted.   NECK:  Supple w/ fair ROM; no  JVD; normal carotid impulses w/o bruits; no thyromegaly or nodules palpated; no lymphadenopathy.    RESP  Clear  P & A; w/o, wheezes/ rales/ or rhonchi. no accessory muscle use, no dullness to percussion  CARD:  RRR, no m/r/g, no peripheral edema, pulses intact, no cyanosis or clubbing.  GI:   Soft & nt; nml bowel sounds; no organomegaly or masses detected.   Musco: Warm bil, no deformities or joint swelling noted.   Neuro: alert, no focal deficits noted.    Skin: Warm, no lesions or rashes    Lab Results:  CBC    Component Value Date/Time   WBC 7.9 11/24/2022 0550   RBC 4.61 11/24/2022 0550   HGB 14.7 11/24/2022 0550   HCT 42.6 11/24/2022 0550   PLT 194 11/24/2022 0550   MCV 92.4 11/24/2022 0550   MCH 31.9 11/24/2022 0550   MCHC 34.5 11/24/2022 0550   RDW 12.0 11/24/2022 0550   LYMPHSABS 3,580 10/25/2020 1540   MONOABS 0.5 10/19/2019 1007   EOSABS 320 10/25/2020 1540   BASOSABS 60 10/25/2020 1540    BMET    Component Value Date/Time   NA 139 11/24/2022 0550   K 4.3 11/24/2022 0550   CL 107 11/24/2022 0550   CO2 21 (L) 11/24/2022 0550   GLUCOSE 186 (H) 11/24/2022 0550   BUN 22 11/24/2022 0550   CREATININE 1.27 (H) 11/24/2022 0550   CREATININE 1.10 (H) 10/25/2020 1540   CALCIUM 10.1 11/24/2022 0550   GFRNONAA 42 (L) 11/24/2022 0550   GFRAA 42 (L) 03/26/2018 1232    BNP No results found for: "BNP"  ProBNP No results found for:  "PROBNP"  Imaging: DG Chest Port 1 View  Result Date: 11/24/2022 CLINICAL DATA:  Status post bronchoscopy and biopsy EXAM: PORTABLE CHEST 1 VIEW COMPARISON:  None Available. FINDINGS: Right middle lobe nodule noted with slightly less distinct margins. No significant pneumothorax. The lungs appear otherwise clear. Thoracic spondylosis. Borderline enlargement of the cardiopericardial silhouette. Atherosclerotic calcification of the aortic arch. IMPRESSION: 1. Right middle lobe nodule noted with slightly less distinct margins. No pneumothorax. 2. Borderline enlargement of the cardiopericardial silhouette. 3. Thoracic spondylosis. 4.  Aortic Atherosclerosis (ICD10-I70.0). Electronically Signed   By: Van Clines M.D.   On: 11/24/2022 09:08   DG C-ARM BRONCHOSCOPY  Result Date: 11/24/2022 C-ARM BRONCHOSCOPY: Fluoroscopy was utilized by the requesting physician.  No radiographic interpretation.   US THYROID  Result Date: 11/03/2022 CLINICAL DATA:  Goiter. No new multinodular thyroid gland. Patient previously underwent biopsy of right and left thyroid nodules in January of 2007 EXAM: THYROID ULTRASOUND TECHNIQUE: Ultrasound examination of the thyroid gland and adjacent soft tissues was performed. COMPARISON:  Most recent prior thyroid ultrasound 02/04/2016 FINDINGS: Parenchymal Echotexture: Moderately heterogenous Isthmus: 0.2 cm Right lobe: 5.3 x 1.6 x 1.8 cm Left lobe: 4.5 x 2.3 x 2.3 cm _________________________________________________________ Estimated total number of nodules >/= 1 cm: 3 Number of spongiform nodules >/=  2 cm not described below (TR1): 0 Number of mixed cystic and solid nodules >/= 1.5 cm not described below (TR2): 0 _________________________________________________________ Nodule # 1: Small spongiform nodule in the right mid gland measures incrementally larger at 1.3 x 1.2 x 0.8 cm compared to 1.0 x 0.8 x 0.4 cm. Lesion remains sonographically low risk and does not warrant further  follow-up. Nodule # 2: The previously biopsied lesion in the right lower gland measures grossly unchanged at 1.5 x 1.4 x 1.2 cm compared to 1.4 x 1.3 x 1.4 cm previously.  Nodule # 3: Diffuse goitrous change of the entire left thyroid gland. No discrete nodules are evident. IMPRESSION: 1. Interval diffuse goitrous replacement of the entire left hemi thyroid. The previously biopsied nodule canal longer be identified a separate from the background thyroid parenchyma. 2. The previously biopsied lesion in the right inferior gland measures grossly unchanged. 3. No new nodules or suspicious features. Electronically Signed   By: Jacqulynn Cadet M.D.   On: 11/03/2022 12:16          No data to display          No results found for: "NITRICOXIDE"      Assessment & Plan:   Carcinoid tumor Right perihilar nodule measuring up to 2.6 cm status post navigational bronchoscopy November 24, 2022 with cytology Positive malignant cells consistent with carcinoid tumor. Case discussed in detail with Dr. Valeta Harms.  Patient will proceed with referral to radiation oncology for SBRT We reached out to radiation oncology and they are make an appointment for her to be seen next week. I will reach out to patient to discuss the above treatment plan       Rexene Edison, NP 12/01/2022

## 2022-12-01 NOTE — Patient Instructions (Signed)
I will call with our treatment plan

## 2022-12-02 NOTE — Telephone Encounter (Signed)
Called x 2 12/01/22 and x 1 12/02/22. LMOMTCB  Will try back on 12/03/22.

## 2022-12-03 NOTE — Telephone Encounter (Signed)
Called patient and advised of plan

## 2022-12-03 NOTE — Telephone Encounter (Signed)
LMOMTCB 12/03/22.

## 2022-12-04 NOTE — Progress Notes (Signed)
Radiation Oncology         [phone] ________________________________  Name: Marie Rhodes        MRN: [id number]  Date of Service: 12/07/2022 DOB: 12-02-1935  CC:Marie Coke, PA  Icard, [place] L, DO     REFERRING PHYSICIAN: Garner Nash, DO   DIAGNOSIS: There were no encounter diagnoses.   HISTORY OF PRESENT ILLNESS: Marie Rhodes is a 87 y.o. female seen at the request of Dr. Valeta Harms for a right sided lung ***. A right sided lung nodule was first noted on CXR on 08/31/22.   Subsequent CT of the chest on 10/08/22 showed a 2.5 cm lesion in the right upper lobe adjacent to the right hilum, no mediastinal or hilar adenopathy, emphysematous changes, and pulmonary scarring.   There were concerns of this being a possible pulmonary AVM due to the Hounsfield unit of the lesion. CT angiogram was ordered to define pulmonary AVM or not.   CT chest angio of the chest on 10/26/22 showed bronchial wall thickening, bibasilar scarring, rounded enhancing nodule in the perihilar right middle lobe that appears to occlude the lateral segment bronchus with endobronchial impaction distantly in the lateral segment measuring 2.6 x 2.2 cm, and no adenopathy.   CTA did not suggest lesion is a pulmonary AVM, so patient underwent bronchoscopy and biopsy with Dr. Valeta Harms on 11/24/22. Pathology from the RML lung nodule showed malignant cells present consistent with typical carcinoid.   Due to her age she is not a surgical candidate for resection. She was kindly referred to [place] to discuss radiation therapy options.     PREVIOUS RADIATION THERAPY: {EXAM; YES/NO:19492::"No"}   PAST MEDICAL HISTORY:  Past Medical History:  Diagnosis Date   ANXIETY 04/27/2007   Arthritis    right big toe   Cancer ([place])    early stage squamous cell - leg   Chronic kidney disease    Stage 3 b - now stage 3 A   COVID 2020   high fever -   CROHN'S DISEASE, SMALL INTESTINE 06/08/2008   ileum, cecum   Diabetes mellitus  without complication ([place])    Fatty liver 1992   never had problems with it   Fatty tumor    left shoulder   GOITER, MULTINODULAR 05/04/2008   Hyperlipemia    Hypertension    Molar pregnancy 1967   OBESITY 08/01/2010   Pneumonia    Thyroid nodule    UNSPECIFIED ANEMIA 05/04/2008   Mild Anemia   Vitamin D deficiency 04/08/2018       PAST SURGICAL HISTORY: Past Surgical History:  Procedure Laterality Date   APPENDECTOMY     BIOPSY THYROID     BRONCHIAL BIOPSY  11/24/2022   Procedure: BRONCHIAL BIOPSIES;  Surgeon: Garner Nash, DO;  Location: [facility];  Service: Pulmonary;;   BRONCHIAL BRUSHINGS  11/24/2022   Procedure: BRONCHIAL BRUSHINGS;  Surgeon: Garner Nash, DO;  Location: [facility];  Service: Pulmonary;;   BRONCHIAL NEEDLE ASPIRATION BIOPSY  11/24/2022   Procedure: BRONCHIAL NEEDLE ASPIRATION BIOPSIES;  Surgeon: Garner Nash, DO;  Location: [facility];  Service: Pulmonary;;   COLONOSCOPY  08/10/2006   normal   LEFT OOPHORECTOMY  1992   MECKEL DIVERTICULUM EXCISION     Ileal and cecal resection      FAMILY HISTORY:  Family History  Problem Relation Age of Onset   Breast cancer Mother 8   Kidney disease Mother        Dialysis  Diabetes Mother    Kidney cancer Mother        Renal Cell Carcinoma   Skin cancer Father        mets   Diabetes Father    Melanoma Sister    Diabetes Brother    Tongue cancer Brother 21   Stroke Maternal Grandmother    Heart disease Paternal Grandmother    Colon cancer Paternal Uncle      SOCIAL HISTORY:  reports that she quit smoking about 34 years ago. Her smoking use included cigarettes. She has been exposed to tobacco smoke. She has never used smokeless tobacco. She reports that she does not drink alcohol and does not use drugs.   ALLERGIES: Tylenol with codeine #3 [acetaminophen-codeine]   MEDICATIONS:  Current Outpatient Medications  Medication Sig Dispense Refill   glucose blood test strip       acetaminophen (TYLENOL) 500 MG tablet Take 1,000 mg by mouth every 6 (six) hours as needed for moderate pain.     ALPRAZolam (XANAX) 0.25 MG tablet Take 1 tablet (0.25 mg total) by mouth at bedtime as needed for anxiety. 30 tablet 0   alum & mag hydroxide-simeth (MAALOX/MYLANTA) 200-200-20 MG/5ML suspension Take 15 mLs by mouth every 6 (six) hours as needed for indigestion or heartburn.     ASPERCREME LIDOCAINE EX Apply 1 Application topically daily as needed (pain).     cholecalciferol (VITAMIN D3) 25 MCG (1000 UNIT) tablet Take 1,000 Units by mouth daily.     FARXIGA 5 MG TABS tablet TAKE 1 TABLET EVERY DAY 90 tablet 0   ferrous sulfate 325 (65 FE) MG tablet Take 1 tablet (325 mg total) by mouth 2 (two) times daily.  3   glucose blood (ONETOUCH VERIO) test strip 1 each by Other route as needed for other. And lancets 1/day 100 each 12   lidocaine (ASPERCREME LIDOCAINE) 4 % Place 1 patch onto the skin every Wednesday.     losartan (COZAAR) 25 MG tablet TAKE ONE TABLET BY MOUTH DAILY 30 tablet 5   Multiple Vitamin (MULTIVITAMIN) tablet Take 1 tablet by mouth at bedtime.     pravastatin (PRAVACHOL) 20 MG tablet Take 1 tablet (20 mg total) by mouth daily. 90 tablet 1   repaglinide (PRANDIN) 1 MG tablet Take 1 tablet (1 mg total) by mouth 2 (two) times daily before a meal. 180 tablet 1   vitamin B-12 (CYANOCOBALAMIN) 1000 MCG tablet Take 1,000 mcg by mouth daily.     No current facility-administered medications for this visit.     REVIEW OF SYSTEMS: On review of systems, the patient reports that *** is doing well overall. *** denies any chest pain, shortness of breath, cough, fevers, chills, night sweats, unintended weight changes. *** denies any bowel or bladder disturbances, and denies abdominal pain, nausea or vomiting. *** denies any new musculoskeletal or joint aches or pains. A complete review of systems is obtained and is otherwise negative.     PHYSICAL EXAM:  Wt Readings from Last 3  Encounters:  12/01/22 206 lb 12.8 oz (93.8 kg)  11/24/22 205 lb (93 kg)  10/29/22 208 lb (94.3 kg)   Temp Readings from Last 3 Encounters:  12/01/22 97.8 F (36.6 C) (Oral)  11/24/22 98.7 F (37.1 C)  08/27/22 98 F (36.7 C) (Temporal)   BP Readings from Last 3 Encounters:  12/01/22 100/60  11/24/22 (!) 119/54  10/29/22 130/78   Pulse Readings from Last 3 Encounters:  12/01/22 94  11/24/22 70  10/29/22 96    /10  In general this is a well appearing *** in no acute distress. ***'s alert and oriented x4 and appropriate throughout the examination. Cardiopulmonary assessment is negative for acute distress and *** exhibits normal effort.     ECOG = ***  0 - Asymptomatic (Fully active, able to carry on all predisease activities without restriction)  1 - Symptomatic but completely ambulatory (Restricted in physically strenuous activity but ambulatory and able to carry out work of a light or sedentary nature. For example, light housework, office work)  2 - Symptomatic, <50% in bed during the day (Ambulatory and capable of all self care but unable to carry out any work activities. Up and about more than 50% of waking hours)  3 - Symptomatic, >50% in bed, but not bedbound (Capable of only limited self-care, confined to bed or chair 50% or more of waking hours)  4 - Bedbound (Completely disabled. Cannot carry on any self-care. Totally confined to bed or chair)  5 - Death   Eustace Pen MM, Creech RH, Tormey DC, et al. [phone]). "Toxicity and response criteria of the [facility]". [place] Oncol. 5 (6): 649-55    LABORATORY DATA:  Lab Results  Component Value Date   WBC 7.9 11/24/2022   HGB 14.7 11/24/2022   HCT 42.6 11/24/2022   MCV 92.4 11/24/2022   PLT 194 11/24/2022   Lab Results  Component Value Date   NA 139 11/24/2022   K 4.3 11/24/2022   CL 107 11/24/2022   CO2 21 (L) 11/24/2022   Lab Results  Component Value Date   ALT 26 02/03/2021    AST 23 02/03/2021   ALKPHOS 73 02/03/2021   BILITOT 0.4 02/03/2021      RADIOGRAPHY: DG Chest Port 1 View  Result Date: 11/24/2022 CLINICAL DATA:  Status post bronchoscopy and biopsy EXAM: PORTABLE CHEST 1 VIEW COMPARISON:  None Available. FINDINGS: Right middle lobe nodule noted with slightly less distinct margins. No significant pneumothorax. The lungs appear otherwise clear. Thoracic spondylosis. Borderline enlargement of the cardiopericardial silhouette. Atherosclerotic calcification of the aortic arch. IMPRESSION: 1. Right middle lobe nodule noted with slightly less distinct margins. No pneumothorax. 2. Borderline enlargement of the cardiopericardial silhouette. 3. Thoracic spondylosis. 4.  Aortic Atherosclerosis (ICD10-I70.0). Electronically Signed   By: Van Clines M.D.   On: 11/24/2022 09:08   DG C-ARM BRONCHOSCOPY  Result Date: 11/24/2022 C-ARM BRONCHOSCOPY: Fluoroscopy was utilized by the requesting physician.  No radiographic interpretation.       IMPRESSION/PLAN: 1.   In a visit lasting *** minutes, greater than 50% of the time was spent face to face discussing the patient's condition, in preparation for the discussion, and coordinating the patient's care.     Marie Singleton, PA-C    Tyler Pita, MD  [facility] Direct Dial: [phone]  Fax: [phone] [email or web address]  Skype  [email or web address]        **Disclaimer: This note was dictated with voice recognition software. Similar sounding words can inadvertently be transcribed and this note may contain transcription errors which may not have been corrected upon publication of note.**

## 2022-12-04 NOTE — Progress Notes (Signed)
Matt, thanks for seeing her   Thanks,  [place], DO [facility] 12/04/2022 8:28 AM

## 2022-12-04 NOTE — Progress Notes (Signed)
Thoracic Location of Tumor / Histology: Lung nodule right middle lobe  11/24/2022 Dr. Leory Plowman Icard DG Chest [place] I View CLINICAL DATA:  Status post bronchoscopy and biopsy  FINDINGS: Right middle lobe nodule noted with slightly less distinct margins.  No significant pneumothorax. The lungs appear otherwise clear.   Thoracic spondylosis. Borderline enlargement of the cardiopericardial silhouette. Atherosclerotic calcification of the aortic arch.   IMPRESSION: 1. Right middle lobe nodule noted with slightly less distinct margins. No pneumothorax. 2. Borderline enlargement of the cardiopericardial silhouette. 3. Thoracic spondylosis. 4.  Aortic Atherosclerosis (ICD10-I70.0).   10/08/2022 Dr. Inda Coke CT Chest with Contrast CLINICAL DATA:  Followup pulmonary nodule seen on recent chest x-rays. * Tracking Code: BO *   FINDINGS: Cardiovascular: The heart is normal in size. No pericardial effusion. The aorta is normal in caliber. No dissection. Age related atherosclerotic calcifications. The branch vessels are patent.  Three-vessel coronary artery calcifications are noted.   The pulmonary arteries are enlarged suggesting pulmonary hypertension.   Mediastinum/Nodes: Small scattered mediastinal and hilar lymph nodes but no mass or overt adenopathy. The esophagus is grossly normal.   Lungs/Pleura: The abnormality on the chest x-ray corresponds to a rounded 2.5 cm lesion in the right upper lobe adjacent to the right hilum. This measures 175 Hounsfield units and is similar to the aorta and left atrial appendage. Findings highly suspicious for a vascular lesion such as a pulmonary AVM. There appear to be some draining veins peripherally. CTA may be helpful for further evaluation. I do not think this is a pulmonary nodule/neoplasm. No other pulmonary lesions are identified.   Underlying emphysematous changes and streaky scarring changes.   Upper Abdomen: No significant upper abdominal  findings.  Low-attenuation right adrenal gland nodule is likely a benign adenoma. Moderate vascular disease.   Musculoskeletal: No breast masses, supraclavicular or axillary adenopathy. Multinodular thyroid goiter noted. Largest nodule in the left lobe measures 2.5 cm. Recommend thyroid US (ref: J Am Coll Radiol. 2015 Feb;12(2): 143-50).The bony thorax is intact.   IMPRESSION: 1. 2.5 cm lesion in the right upper lobe adjacent to the right hilum is most likely a vascular lesion such as a pulmonary AVM. CTA may be helpful for further evaluation. 2. No mediastinal or hilar mass or adenopathy. 3. Underlying emphysematous changes and pulmonary scarring. 4. Enlarged pulmonary arteries suggesting pulmonary hypertension. 5. Multinodular thyroid goiter with dominant lesion in the left thyroid lobe. Recommend thyroid US. Aortic Atherosclerosis (ICD10-I70.0) and Emphysema (ICD10-J43.9).   08/31/2022 Dr. Inda Coke DG Chest 2 View CLINICAL DATA:  Follow-up chest radiograph.   FINDINGS: Similar appearance of right perihilar nodule measuring up to 2.8 cm.  No focal consolidation, pleural effusion, or pneumothorax. Linear right mid lung field subpleural scarring. The cardiac silhouette is within limits. Atherosclerotic calcification of the aorta. No acute osseous pathology.   IMPRESSION: 1. No acute cardiopulmonary process. 2. Similar appearance of right perihilar nodule. Further evaluation with CT is recommended.    Tobacco/Marijuana/Snuff/ETOH use:  Former smoker (Sept. 1989), no drug, snuff, or alcohol use.  Past/Anticipated interventions by cardiothoracic surgery, if any:   11/24/2022 Dr. Leory Plowman Icard ROBOTIC ASSISTED NAVIGATIONAL BRONCHOSCOPY(Right Bronchial biopsy)  Past/Anticipated interventions by pulmonary, if any:   12/01/2022 Rexene Edison, NP Assessment/Plan: Carcinoid tumor Right perihilar nodule measuring up to 2.6 cm status post navigational bronchoscopy November 24, 2022  with cytology Positive malignant cells consistent with carcinoid tumor.  Case discussed in detail with Dr. Valeta Harms.  Patient will proceed with referral to radiation oncology for SBRT  We reached out to radiation oncology and they are make an appointment for her to be seen next week.  I will reach out to patient to discuss the above treatment plan  Signs/Symptoms Weight changes, if any: {:18581} Respiratory complaints, if any: {:18581} Hemoptysis, if any: {:18581} Pain issues, if any:  {:18581}  SAFETY ISSUES: Prior radiation? {:18581} Pacemaker/ICD? {:18581}  Possible current pregnancy? Postmenopausal Is the patient on methotrexate? No  Current Complaints / other details:

## 2022-12-07 ENCOUNTER — Encounter: Payer: Self-pay | Admitting: Radiation Oncology

## 2022-12-07 ENCOUNTER — Ambulatory Visit
Admission: RE | Admit: 2022-12-07 | Discharge: 2022-12-07 | Disposition: A | Discharge status: Home / Self Care | Payer: Medicare PPO | Source: Ambulatory Visit | Attending: Radiation Oncology | Admitting: Radiation Oncology

## 2022-12-07 ENCOUNTER — Other Ambulatory Visit: Payer: Self-pay

## 2022-12-07 VITALS — BP 135/61 | HR 101 | Temp 97.5°F | Resp 20 | Ht 66.0 in | Wt 208.0 lb

## 2022-12-07 DIAGNOSIS — N1832 Chronic kidney disease, stage 3b: Secondary | ICD-10-CM | POA: Insufficient documentation

## 2022-12-07 DIAGNOSIS — M47814 Spondylosis without myelopathy or radiculopathy, thoracic region: Secondary | ICD-10-CM | POA: Diagnosis not present

## 2022-12-07 DIAGNOSIS — I129 Hypertensive chronic kidney disease with stage 1 through stage 4 chronic kidney disease, or unspecified chronic kidney disease: Secondary | ICD-10-CM | POA: Insufficient documentation

## 2022-12-07 DIAGNOSIS — Z8616 Personal history of COVID-19: Secondary | ICD-10-CM | POA: Insufficient documentation

## 2022-12-07 DIAGNOSIS — Z87891 Personal history of nicotine dependence: Secondary | ICD-10-CM | POA: Insufficient documentation

## 2022-12-07 DIAGNOSIS — Z800 Family history of malignant neoplasm of digestive organs: Secondary | ICD-10-CM | POA: Insufficient documentation

## 2022-12-07 DIAGNOSIS — N2889 Other specified disorders of kidney and ureter: Secondary | ICD-10-CM | POA: Diagnosis not present

## 2022-12-07 DIAGNOSIS — R911 Solitary pulmonary nodule: Secondary | ICD-10-CM | POA: Diagnosis not present

## 2022-12-07 DIAGNOSIS — E119 Type 2 diabetes mellitus without complications: Secondary | ICD-10-CM | POA: Insufficient documentation

## 2022-12-07 DIAGNOSIS — Z8051 Family history of malignant neoplasm of kidney: Secondary | ICD-10-CM | POA: Insufficient documentation

## 2022-12-07 DIAGNOSIS — E785 Hyperlipidemia, unspecified: Secondary | ICD-10-CM | POA: Diagnosis not present

## 2022-12-07 DIAGNOSIS — Z79899 Other long term (current) drug therapy: Secondary | ICD-10-CM | POA: Insufficient documentation

## 2022-12-07 DIAGNOSIS — Z803 Family history of malignant neoplasm of breast: Secondary | ICD-10-CM | POA: Diagnosis not present

## 2022-12-07 DIAGNOSIS — E079 Disorder of thyroid, unspecified: Secondary | ICD-10-CM | POA: Insufficient documentation

## 2022-12-07 DIAGNOSIS — C342 Malignant neoplasm of middle lobe, bronchus or lung: Secondary | ICD-10-CM

## 2022-12-07 DIAGNOSIS — C3401 Malignant neoplasm of right main bronchus: Secondary | ICD-10-CM | POA: Diagnosis not present

## 2022-12-07 DIAGNOSIS — Z7984 Long term (current) use of oral hypoglycemic drugs: Secondary | ICD-10-CM | POA: Diagnosis not present

## 2022-12-08 ENCOUNTER — Ambulatory Visit: Payer: Medicare PPO | Admitting: Radiation Oncology

## 2022-12-08 ENCOUNTER — Telehealth: Payer: Self-pay | Admitting: Pulmonary Disease

## 2022-12-08 ENCOUNTER — Ambulatory Visit: Payer: Medicare PPO

## 2022-12-08 ENCOUNTER — Ambulatory Visit: Payer: Medicare PPO | Admitting: Adult Health

## 2022-12-08 NOTE — Telephone Encounter (Signed)
Called and spoke with patient, she verbalized understanding and appreciation.   Nothing further needed at time of call.

## 2022-12-08 NOTE — Telephone Encounter (Signed)
I called PT based on Dr. Juline Patch recall list. She states she was referred to Dr. Tammi Klippel by Dr. Valeta Harms so I will clear the recall. If a follow up appt is needed pls let the front desk pool know and we will set a FU appt.    She wanted to extend her thanks to Dr. Valeta Harms for all he did for her.

## 2022-12-08 NOTE — Telephone Encounter (Signed)
Dr. Valeta Harms, does she need to follow up with you since she is following with oncology?

## 2022-12-09 DIAGNOSIS — C7A090 Malignant carcinoid tumor of the bronchus and lung: Secondary | ICD-10-CM | POA: Insufficient documentation

## 2022-12-09 NOTE — Progress Notes (Signed)
  Radiation Oncology         [phone] ________________________________  Name: Marie Rhodes MRN: [id number]  Date: 12/10/2022  DOB: 22-Mar-1936  STEREOTACTIC BODY RADIOTHERAPY SIMULATION AND TREATMENT PLANNING NOTE    ICD-10-CM   1. 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  Identity was confirmed.  All relevant records and images related to the planned course of therapy were reviewed.  The patient freely provided informed written consent to proceed with treatment after reviewing the details related to the planned course of therapy. The consent form was witnessed and verified by the simulation staff.  Then, the patient was set-up in a stable reproducible  supine position for radiation therapy.  A BodyFix immobilization pillow was fabricated for reproducible positioning.  Then I personally applied the abdominal compression paddle to limit respiratory excursion.  4D respiratoy motion management CT images were obtained.  Surface markings were placed.  The CT images were loaded into the planning software.  Then, using Cine, MIP, and standard views, the internal target volume (ITV) and planning target volumes (PTV) were delinieated, and avoidance structures were contoured.  Treatment planning then occurred.  The radiation prescription was entered and confirmed.  A total of two complex treatment devices were fabricated in the form of the BodyFix immobilization pillow and a neck accuform cushion.  I have requested : 3D Simulation  I have requested a DVH of the following structures: Heart, Lungs, Esophagus, Chest Wall, Brachial Plexus, Major Blood Vessels, and targets.  SPECIAL TREATMENT PROCEDURE:  The planned course of therapy using radiation constitutes a special treatment procedure. Special care is required  in the management of this patient for the following reasons. This treatment constitutes a Special Treatment Procedure for the following reason: [ High dose per fraction requiring special monitoring for increased toxicities of treatment including daily imaging..  The special nature of the planned course of radiotherapy will require increased physician supervision and oversight to ensure patient's safety with optimal treatment outcomes.  This requires extended time and effort.    RESPIRATORY MOTION MANAGEMENT SIMULATION:  In order to account for effect of respiratory motion on target structures and other organs in the planning and delivery of radiotherapy, this patient underwent respiratory motion management simulation.  To accomplish this, when the patient was brought to the CT simulation planning suite, 4D respiratoy motion management CT images were obtained.  The CT images were loaded into the planning software.  Then, using a variety of tools including Cine, MIP, and standard views, the target volume and planning target volumes (PTV) were delineated.  Avoidance structures were contoured.  Treatment planning then occurred.  Dose volume histograms were generated and reviewed for each of the requested structure.  The resulting plan was carefully reviewed and approved today.  PLAN:  The patient will receive 60 Gy in 5 fractions of 12 Gy each.  ________________________________  Sheral Apley. Tammi Klippel, M.D.

## 2022-12-10 ENCOUNTER — Ambulatory Visit
Admission: RE | Admit: 2022-12-10 | Discharge: 2022-12-10 | Disposition: A | Discharge status: Home / Self Care | Payer: Medicare PPO | Source: Ambulatory Visit | Attending: Radiation Oncology | Admitting: Radiation Oncology

## 2022-12-10 DIAGNOSIS — C7A090 Malignant carcinoid tumor of the bronchus and lung: Secondary | ICD-10-CM | POA: Diagnosis not present

## 2022-12-10 DIAGNOSIS — C3401 Malignant neoplasm of right main bronchus: Secondary | ICD-10-CM | POA: Diagnosis not present

## 2022-12-10 DIAGNOSIS — Z87891 Personal history of nicotine dependence: Secondary | ICD-10-CM | POA: Diagnosis not present

## 2022-12-10 DIAGNOSIS — Z510 Encounter for antineoplastic radiation therapy: Secondary | ICD-10-CM | POA: Diagnosis not present

## 2022-12-11 DIAGNOSIS — R0981 Nasal congestion: Secondary | ICD-10-CM | POA: Diagnosis not present

## 2022-12-11 DIAGNOSIS — J40 Bronchitis, not specified as acute or chronic: Secondary | ICD-10-CM | POA: Diagnosis not present

## 2022-12-11 DIAGNOSIS — Z20822 Contact with and (suspected) exposure to covid-19: Secondary | ICD-10-CM | POA: Diagnosis not present

## 2022-12-18 DIAGNOSIS — C3401 Malignant neoplasm of right main bronchus: Secondary | ICD-10-CM | POA: Diagnosis not present

## 2022-12-18 DIAGNOSIS — Z87891 Personal history of nicotine dependence: Secondary | ICD-10-CM | POA: Diagnosis not present

## 2022-12-18 DIAGNOSIS — C7A090 Malignant carcinoid tumor of the bronchus and lung: Secondary | ICD-10-CM | POA: Diagnosis not present

## 2022-12-18 DIAGNOSIS — Z510 Encounter for antineoplastic radiation therapy: Secondary | ICD-10-CM | POA: Diagnosis not present

## 2022-12-28 ENCOUNTER — Ambulatory Visit
Admission: RE | Admit: 2022-12-28 | Discharge: 2022-12-28 | Disposition: A | Discharge status: Home / Self Care | Payer: Medicare PPO | Source: Ambulatory Visit | Attending: Radiation Oncology | Admitting: Radiation Oncology

## 2022-12-28 ENCOUNTER — Other Ambulatory Visit: Payer: Self-pay

## 2022-12-28 DIAGNOSIS — C7A090 Malignant carcinoid tumor of the bronchus and lung: Secondary | ICD-10-CM | POA: Diagnosis not present

## 2022-12-28 DIAGNOSIS — Z510 Encounter for antineoplastic radiation therapy: Secondary | ICD-10-CM | POA: Insufficient documentation

## 2022-12-28 LAB — RAD ONC ARIA SESSION SUMMARY
Course Elapsed Days: 0
Plan Fractions Treated to Date: 1
Plan Prescribed Dose Per Fraction: 12 Gy
Plan Total Fractions Prescribed: 5
Plan Total Prescribed Dose: 60 Gy
Reference Point Dosage Given to Date: 12 Gy
Reference Point Session Dosage Given: 12 Gy
Session Number: 1

## 2022-12-29 ENCOUNTER — Ambulatory Visit: Payer: Medicare PPO | Admitting: Radiation Oncology

## 2022-12-30 ENCOUNTER — Ambulatory Visit
Admission: RE | Admit: 2022-12-30 | Discharge: 2022-12-30 | Disposition: A | Discharge status: Home / Self Care | Payer: Medicare PPO | Source: Ambulatory Visit | Attending: Radiation Oncology | Admitting: Radiation Oncology

## 2022-12-30 ENCOUNTER — Other Ambulatory Visit: Payer: Self-pay

## 2022-12-30 DIAGNOSIS — Z510 Encounter for antineoplastic radiation therapy: Secondary | ICD-10-CM | POA: Diagnosis not present

## 2022-12-30 DIAGNOSIS — C7A090 Malignant carcinoid tumor of the bronchus and lung: Secondary | ICD-10-CM

## 2022-12-30 LAB — RAD ONC ARIA SESSION SUMMARY
Course Elapsed Days: 2
Plan Fractions Treated to Date: 2
Plan Prescribed Dose Per Fraction: 12 Gy
Plan Total Fractions Prescribed: 5
Plan Total Prescribed Dose: 60 Gy
Reference Point Dosage Given to Date: 24 Gy
Reference Point Session Dosage Given: 12 Gy
Session Number: 2

## 2022-12-31 ENCOUNTER — Ambulatory Visit: Payer: Medicare PPO | Admitting: Radiation Oncology

## 2023-01-01 ENCOUNTER — Other Ambulatory Visit: Payer: Self-pay

## 2023-01-01 ENCOUNTER — Ambulatory Visit
Admission: RE | Admit: 2023-01-01 | Discharge: 2023-01-01 | Disposition: A | Discharge status: Home / Self Care | Payer: Medicare PPO | Source: Ambulatory Visit | Attending: Radiation Oncology | Admitting: Radiation Oncology

## 2023-01-01 DIAGNOSIS — Z510 Encounter for antineoplastic radiation therapy: Secondary | ICD-10-CM | POA: Diagnosis not present

## 2023-01-01 DIAGNOSIS — C7A090 Malignant carcinoid tumor of the bronchus and lung: Secondary | ICD-10-CM | POA: Diagnosis not present

## 2023-01-01 LAB — RAD ONC ARIA SESSION SUMMARY
Course Elapsed Days: 4
Plan Fractions Treated to Date: 3
Plan Prescribed Dose Per Fraction: 12 Gy
Plan Total Fractions Prescribed: 5
Plan Total Prescribed Dose: 60 Gy
Reference Point Dosage Given to Date: 36 Gy
Reference Point Session Dosage Given: 12 Gy
Session Number: 3

## 2023-01-05 ENCOUNTER — Ambulatory Visit
Admission: RE | Admit: 2023-01-05 | Discharge: 2023-01-05 | Disposition: A | Discharge status: Home / Self Care | Payer: Medicare PPO | Source: Ambulatory Visit | Attending: Radiation Oncology | Admitting: Radiation Oncology

## 2023-01-05 ENCOUNTER — Other Ambulatory Visit: Payer: Self-pay

## 2023-01-05 ENCOUNTER — Other Ambulatory Visit: Payer: Self-pay | Admitting: Endocrinology

## 2023-01-05 DIAGNOSIS — Z510 Encounter for antineoplastic radiation therapy: Secondary | ICD-10-CM | POA: Diagnosis not present

## 2023-01-05 DIAGNOSIS — C7A090 Malignant carcinoid tumor of the bronchus and lung: Secondary | ICD-10-CM | POA: Diagnosis not present

## 2023-01-05 DIAGNOSIS — E119 Type 2 diabetes mellitus without complications: Secondary | ICD-10-CM

## 2023-01-05 LAB — RAD ONC ARIA SESSION SUMMARY
Course Elapsed Days: 8
Plan Fractions Treated to Date: 4
Plan Prescribed Dose Per Fraction: 12 Gy
Plan Total Fractions Prescribed: 5
Plan Total Prescribed Dose: 60 Gy
Reference Point Dosage Given to Date: 48 Gy
Reference Point Session Dosage Given: 12 Gy
Session Number: 4

## 2023-01-07 ENCOUNTER — Ambulatory Visit: Payer: Medicare PPO

## 2023-01-07 ENCOUNTER — Other Ambulatory Visit: Payer: Self-pay

## 2023-01-07 ENCOUNTER — Ambulatory Visit
Admission: RE | Admit: 2023-01-07 | Discharge: 2023-01-07 | Disposition: A | Discharge status: Home / Self Care | Payer: Medicare PPO | Source: Ambulatory Visit | Attending: Radiation Oncology | Admitting: Radiation Oncology

## 2023-01-07 ENCOUNTER — Encounter: Payer: Self-pay | Admitting: Urology

## 2023-01-07 DIAGNOSIS — Z87891 Personal history of nicotine dependence: Secondary | ICD-10-CM | POA: Diagnosis not present

## 2023-01-07 DIAGNOSIS — Z510 Encounter for antineoplastic radiation therapy: Secondary | ICD-10-CM | POA: Diagnosis not present

## 2023-01-07 DIAGNOSIS — C7A090 Malignant carcinoid tumor of the bronchus and lung: Secondary | ICD-10-CM | POA: Diagnosis not present

## 2023-01-07 DIAGNOSIS — C3401 Malignant neoplasm of right main bronchus: Secondary | ICD-10-CM | POA: Diagnosis not present

## 2023-01-07 LAB — RAD ONC ARIA SESSION SUMMARY
Course Elapsed Days: 10
Plan Fractions Treated to Date: 5
Plan Prescribed Dose Per Fraction: 12 Gy
Plan Total Fractions Prescribed: 5
Plan Total Prescribed Dose: 60 Gy
Reference Point Dosage Given to Date: 60 Gy
Reference Point Session Dosage Given: 12 Gy
Session Number: 5

## 2023-01-25 ENCOUNTER — Other Ambulatory Visit: Payer: Self-pay | Admitting: Physician Assistant

## 2023-01-27 ENCOUNTER — Telehealth: Payer: Self-pay

## 2023-01-27 NOTE — Patient Outreach (Signed)
  Care Coordination   01/27/2023 Name: Marie Rhodes MRN: [id number] DOB: 09/04/1936   Care Coordination Outreach Attempts:  An unsuccessful telephone outreach was attempted today to offer the patient information about available care coordination services.  Follow Up Plan:  Additional outreach attempts will be made to offer the patient care coordination information and services.   Encounter Outcome:  No Answer   Care Coordination Interventions:  No, not indicated    Bevelyn Ngo, BSW, CDP Social Worker, Certified Dementia Practitioner [facility] Care Management  Care Coordination [phone]

## 2023-01-28 ENCOUNTER — Other Ambulatory Visit: Payer: Self-pay | Admitting: Urology

## 2023-01-28 DIAGNOSIS — C7A090 Malignant carcinoid tumor of the bronchus and lung: Secondary | ICD-10-CM

## 2023-01-28 NOTE — Progress Notes (Signed)
  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  Volumetric arc fields were employed to deliver 6 MV X-rays.  Image guidance was performed with per fraction cone beam CT prior to treatment under personal MD supervision.  Immobilization was achieved using BodyFix Pillow.  Narrative: The patient tolerated radiation treatment relatively well with only modest fatigue.  Plan: The patient will receive a call in about one month from the radiation oncology department.  We will plan to obtain a posttreatment CT chest scan in July 2024 and we will call her with those results as soon as available.  She will continue follow up with her pulmonologist, Dr. Tonia Brooms, as well.  ________________________________  Artist Pais. Kathrynn Running, M.D.

## 2023-02-01 ENCOUNTER — Ambulatory Visit: Payer: Self-pay

## 2023-02-01 NOTE — Patient Instructions (Signed)
Visit Information  Thank you for taking time to visit with me today. Please don't hesitate to contact me if I can be of assistance to you.   Following are the goals we discussed today:  -Contact your primary care provider as needed  If you are experiencing a Mental Health or Behavioral Health Crisis or need someone to talk to, please go to [facility] [address], [place] ([phone])  Patient verbalizes understanding of instructions and care plan provided today and agrees to view in MyChart. Active MyChart status and patient understanding of how to access instructions and care plan via MyChart confirmed with patient.     Khyler Eschmann, BSW, CDP Social Worker, Certified Dementia Practitioner [facility] Care Management  Care Coordination [phone]          

## 2023-02-01 NOTE — Patient Outreach (Signed)
  Care Coordination   Initial Visit Note   02/01/2023 Name: Marie Rhodes MRN: [id number] DOB: January 15, 1936  Marie Rhodes is a 87 y.o. year old female who sees [place], [place], [place] for primary care. I spoke with  Marie Rhodes by phone today. SW educated the patient on the role of the care coordination team. No follow up is desired at this time.  What matters to the patients health and wellness today?  No concerns, patient reports she is doing well at this time.    Goals Addressed             This Visit's Progress    COMPLETED: Care Coordination Activities       Care Coordination Interventions: [facility] screening reviewed - no acute resource challenges identified at this time Determined the patient does not have concerns with medication costs and/or adherence at this time Educated the patient on the role of the care coordination team - no follow up desired at this time Encouraged the patient to contact her primary care provider as needed         SDOH assessments and interventions completed:  Yes  SDOH Interventions Today    Flowsheet Row Most Recent Value  SDOH Interventions   Food Insecurity Interventions Intervention Not Indicated  Housing Interventions Intervention Not Indicated  Transportation Interventions Intervention Not Indicated  Utilities Interventions Intervention Not Indicated        Care Coordination Interventions:  Yes, provided   Interventions Today    Flowsheet Row Most Recent Value  Chronic Disease   Chronic disease during today's visit Hypertension (HTN), Diabetes  General Interventions   General Interventions Discussed/Reviewed General Interventions Discussed, Doctor Visits  Doctor Visits Discussed/Reviewed Doctor Visits Reviewed  Education Interventions   Education Provided Provided Education  Provided Verbal Education On Other  [role of the care coordination team]        Follow up plan: No further intervention required.   Encounter  Outcome:  Pt. Visit Completed   Marie Rhodes, BSW, CDP Social Worker, Certified Dementia Practitioner [facility] Care Management  Care Coordination [phone]

## 2023-02-02 DIAGNOSIS — L821 Other seborrheic keratosis: Secondary | ICD-10-CM | POA: Diagnosis not present

## 2023-02-02 DIAGNOSIS — L570 Actinic keratosis: Secondary | ICD-10-CM | POA: Diagnosis not present

## 2023-02-02 DIAGNOSIS — Z85828 Personal history of other malignant neoplasm of skin: Secondary | ICD-10-CM | POA: Diagnosis not present

## 2023-02-02 DIAGNOSIS — D225 Melanocytic nevi of trunk: Secondary | ICD-10-CM | POA: Diagnosis not present

## 2023-02-02 DIAGNOSIS — D2262 Melanocytic nevi of left upper limb, including shoulder: Secondary | ICD-10-CM | POA: Diagnosis not present

## 2023-02-02 DIAGNOSIS — B351 Tinea unguium: Secondary | ICD-10-CM | POA: Diagnosis not present

## 2023-02-02 DIAGNOSIS — L720 Epidermal cyst: Secondary | ICD-10-CM | POA: Diagnosis not present

## 2023-02-09 ENCOUNTER — Ambulatory Visit
Admission: RE | Admit: 2023-02-09 | Discharge: 2023-02-09 | Disposition: A | Discharge status: Home / Self Care | Payer: Medicare PPO | Source: Ambulatory Visit | Attending: Radiation Oncology | Admitting: Radiation Oncology

## 2023-02-09 NOTE — Progress Notes (Signed)
  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   The patient has scheduled follow up with her medical oncologist Dr. Tonia Brooms for ongoing care, and was encouraged to call if she develops concerns or questions regarding radiation.    Ruel Favors, LPN

## 2023-02-15 ENCOUNTER — Ambulatory Visit (INDEPENDENT_AMBULATORY_CARE_PROVIDER_SITE_OTHER): Payer: Medicare PPO

## 2023-02-15 VITALS — BP 112/64 | HR 93 | Temp 97.7°F | Wt 203.8 lb

## 2023-02-15 DIAGNOSIS — E1122 Type 2 diabetes mellitus with diabetic chronic kidney disease: Secondary | ICD-10-CM | POA: Diagnosis not present

## 2023-02-15 DIAGNOSIS — I129 Hypertensive chronic kidney disease with stage 1 through stage 4 chronic kidney disease, or unspecified chronic kidney disease: Secondary | ICD-10-CM | POA: Diagnosis not present

## 2023-02-15 DIAGNOSIS — Z0000 Encounter for general adult medical examination without abnormal findings: Secondary | ICD-10-CM | POA: Diagnosis not present

## 2023-02-15 DIAGNOSIS — N2581 Secondary hyperparathyroidism of renal origin: Secondary | ICD-10-CM | POA: Diagnosis not present

## 2023-02-15 DIAGNOSIS — N1832 Chronic kidney disease, stage 3b: Secondary | ICD-10-CM | POA: Diagnosis not present

## 2023-02-15 NOTE — Progress Notes (Signed)
Subjective:   Marie Rhodes is a 87 y.o. female who presents for Medicare Annual (Subsequent) preventive examination.  Review of Systems     Cardiac Risk Factors include: advanced age (>61men, >60 women);diabetes mellitus;obesity (BMI >30kg/m2)     Objective:    Today's Vitals   02/15/23 1513  BP: 112/64  Pulse: 93  Temp: 97.7 F (36.5 C)  SpO2: 97%  Weight: 203 lb 12.8 oz (92.4 kg)   Body mass index is 32.89 kg/m.     02/15/2023    3:19 PM 12/07/2022   10:43 AM 11/24/2022    6:36 AM 02/02/2022    3:22 PM 01/27/2021    3:27 PM 10/19/2019   10:38 AM 03/26/2018    1:01 PM  Advanced Directives  Does Patient Have a Medical Advance Directive? Yes Yes Yes Yes Yes No No  Type of Estate agent of [place];Living will Healthcare Power of [place];Living will Healthcare Power of [employer] Power of Attorney     Does patient want to make changes to medical advance directive? No - Patient declined    Yes (MAU/Ambulatory/Procedural Areas - Information given)    Copy of Healthcare Power of Attorney in Chart? Yes - validated most recent copy scanned in chart (See row information)  No - copy requested Yes - validated most recent copy scanned in chart (See row information)     Would patient like information on creating a medical advance directive?      Yes (MAU/Ambulatory/Procedural Areas - Information given)     Current Medications (verified) Outpatient Encounter Medications as of 02/15/2023  Medication Sig   acetaminophen (TYLENOL) 500 MG tablet Take 1,000 mg by mouth every 6 (six) hours as needed for moderate pain.   ALPRAZolam (XANAX) 0.25 MG tablet Take 1 tablet (0.25 mg total) by mouth at bedtime as needed for anxiety.   alum & mag hydroxide-simeth (MAALOX/MYLANTA) 200-200-20 MG/5ML suspension Take 15 mLs by mouth every 6 (six) hours as needed for indigestion or heartburn.   cholecalciferol (VITAMIN D3) 25 MCG (1000 UNIT) tablet Take 1,000 Units by mouth  daily.   dapagliflozin propanediol (FARXIGA) 5 MG TABS tablet TAKE 1 TABLET EVERY DAY   ferrous sulfate 325 (65 FE) MG tablet Take 1 tablet (325 mg total) by mouth 2 (two) times daily.   lidocaine (ASPERCREME LIDOCAINE) 4 % Place 1 patch onto the skin every Wednesday.   losartan (COZAAR) 25 MG tablet TAKE ONE TABLET BY MOUTH DAILY   Multiple Vitamin (MULTIVITAMIN) tablet Take 1 tablet by mouth at bedtime.   pravastatin (PRAVACHOL) 20 MG tablet Take 1 tablet (20 mg total) by mouth daily.   repaglinide (PRANDIN) 1 MG tablet Take 1 tablet (1 mg total) by mouth 2 (two) times daily before a meal.   vitamin B-12 (CYANOCOBALAMIN) 1000 MCG tablet Take 1,000 mcg by mouth daily.   glucose blood (ONETOUCH VERIO) test strip 1 each by Other route as needed for other. And lancets 1/day (Patient not taking: Reported on 02/15/2023)   glucose blood test strip  (Patient not taking: Reported on 02/15/2023)   [DISCONTINUED] ASPERCREME LIDOCAINE EX Apply 1 Application topically daily as needed (pain).   No facility-administered encounter medications on file as of 02/15/2023.    Allergies (verified) Tylenol with codeine #3 [acetaminophen-codeine]   History: Past Medical History:  Diagnosis Date   ANXIETY 04/27/2007   Arthritis    right big toe   Cancer (HCC)    early stage squamous cell - leg  Chronic kidney disease    Stage 3 b - now stage 3 A   COVID 2020   high fever -   CROHN'S DISEASE, SMALL INTESTINE 06/08/2008   ileum, cecum   Diabetes mellitus without complication (HCC)    Fatty liver 1992   never had problems with it   Fatty tumor    left shoulder   GOITER, MULTINODULAR 05/04/2008   Hyperlipemia    Hypertension    Molar pregnancy 1967   OBESITY 08/01/2010   Pneumonia    Thyroid nodule    UNSPECIFIED ANEMIA 05/04/2008   Mild Anemia   Vitamin D deficiency 04/08/2018   Past Surgical History:  Procedure Laterality Date   APPENDECTOMY     BIOPSY THYROID     BRONCHIAL BIOPSY   11/24/2022   Procedure: BRONCHIAL BIOPSIES;  Surgeon: Josephine Igo, DO;  Location: [facility];  Service: Pulmonary;;   BRONCHIAL BRUSHINGS  11/24/2022   Procedure: BRONCHIAL BRUSHINGS;  Surgeon: Josephine Igo, DO;  Location: [facility];  Service: Pulmonary;;   BRONCHIAL NEEDLE ASPIRATION BIOPSY  11/24/2022   Procedure: BRONCHIAL NEEDLE ASPIRATION BIOPSIES;  Surgeon: Josephine Igo, DO;  Location: [facility];  Service: Pulmonary;;   COLONOSCOPY  08/10/2006   normal   LEFT OOPHORECTOMY  1992   MECKEL DIVERTICULUM EXCISION     Ileal and cecal resection    Family History  Problem Relation Age of Onset   Breast cancer Mother 50   Kidney disease Mother        Dialysis   Diabetes Mother    Kidney cancer Mother        Renal Cell Carcinoma   Skin cancer Father        mets   Diabetes Father    Melanoma Sister    Diabetes Brother    Tongue cancer Brother 37   Stroke Maternal Grandmother    Heart disease Paternal Grandmother    Colon cancer Paternal Uncle    Social History   Socioeconomic History   Marital status: Widowed    Spouse name: Not on file   Number of children: Not on file   Years of education: Not on file   Highest education level: Not on file  Occupational History   Occupation: retired Runner, broadcasting/film/video    Comment: retired Printmaker)  Tobacco Use   Smoking status: Former    Types: Cigarettes    Quit date: 05/29/1988    Years since quitting: 34.7    Passive exposure: Past   Smokeless tobacco: Never  Vaping Use   Vaping Use: Never used  Substance and Sexual Activity   Alcohol use: No   Drug use: No   Sexual activity: Not on file  Other Topics Concern   Not on file  Social History Narrative   Patient is widowed --husband died from brain aneurysm, she is a retired Runner, 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   Former Smoker (college only), no alcohol or drug use   Both of her grandchildren lived with her while they went to  college    As of November 2019, she lives alone   Social Determinants of Health   Financial Resource Strain: Low Risk  (02/15/2023)   Overall Financial Resource Strain (CARDIA)    Difficulty of Paying Living Expenses: Not hard at all  Food Insecurity: No Food Insecurity (02/15/2023)   Hunger Vital Sign    Worried About Running Out of Food in the Last  Year: Never true    Ran Out of Food in the Last Year: Never true  Transportation Needs: No Transportation Needs (02/15/2023)   PRAPARE - Administrator, Civil Service (Medical): No    Lack of Transportation (Non-Medical): No  Physical Activity: Inactive (02/15/2023)   Exercise Vital Sign    Days of Exercise per Week: 0 days    Minutes of Exercise per Session: 0 min  Stress: No Stress Concern Present (02/15/2023)   [employer] of Occupational Health - Occupational Stress Questionnaire    Feeling of Stress : Not at all  Social Connections: Moderately Isolated (02/15/2023)   Social Connection and Isolation Panel [NHANES]    Frequency of Communication with Friends and Family: More than three times a week    Frequency of Social Gatherings with Friends and Family: More than three times a week    Attends Religious Services: More than 4 times per year    Active Member of [employer] or Organizations: No    Attends Banker Meetings: Never    Marital Status: Widowed    Tobacco Counseling Counseling given: Not Answered   Clinical Intake:  Pre-visit preparation completed: Yes  Pain : No/denies pain     BMI - recorded: 32.89 Nutritional Status: BMI > 30  Obese Nutritional Risks: None Diabetes: No  How often do you need to have someone help you when you read instructions, pamphlets, or other written materials from your doctor or pharmacy?: 1 - Never  Diabetic?Nutrition Risk Assessment:  Has the patient had any N/V/D within the last 2 months?  No  Does the patient have any non-healing wounds?  No  Has the  patient had any unintentional weight loss or weight gain?  No   Diabetes:  Is the patient diabetic?  Yes  If diabetic, was a CBG obtained today?  No  Did the patient bring in their glucometer from home?  No  How often do you monitor your CBG's? N/a.   Financial Strains and Diabetes Management:  Are you having any financial strains with the device, your supplies or your medication? No .  Does the patient want to be seen by Chronic Care Management for management of their diabetes?  No  Would the patient like to be referred to a Nutritionist or for Diabetic Management?  No   Diabetic Exams:  Diabetic Eye Exam: Completed 07/31/22 Diabetic Foot Exam: Overdue, Pt has been advised about the importance in completing this exam. Pt is scheduled for diabetic foot exam on next appt .   Interpreter Needed?: No  Information entered by :: Lanier Ensign, LPN   Activities of Daily Living    02/15/2023    3:20 PM  In your present state of health, do you have any difficulty performing the following activities:  Hearing? 1  Vision? 0  Difficulty concentrating or making decisions? 0  Walking or climbing stairs? 0  Dressing or bathing? 0  Doing errands, shopping? 0  Preparing Food and eating ? N  Using the Toilet? N  In the past six months, have you accidently leaked urine? N  Do you have problems with loss of bowel control? N  Managing your Medications? N  Managing your Finances? N  Housekeeping or managing your Housekeeping? N    Patient Care Team: Jarold Motto, [place] as PCP - General (Physician Assistant) Iva Boop, MD as Attending Physician (Gastroenterology) Althea Charon, MD (Radiology) Venancio Poisson, MD as Attending Physician (Dermatology) Romero Belling, MD (  Inactive) as Consulting Physician (Endocrinology) Luane School, OD as Consulting Physician (Optometry)  Indicate any recent Medical Services you may have received from other than [facility] providers in the past  year (date may be approximate).     Assessment:   This is a routine wellness examination for Marie Rhodes.  Hearing/Vision screen Hearing Screening - Comments:: Pt stated slight hearing loss  Vision Screening - Comments:: Pt follows up with [facility] provider   Dietary issues and exercise activities discussed: Current Exercise Habits: The patient does not participate in regular exercise at present   Goals Addressed             This Visit's Progress    Patient Stated       Continue to stay healthy        Depression Screen    02/15/2023    3:17 PM 12/07/2022   10:51 AM 02/02/2022    3:20 PM 06/09/2021    1:08 PM 01/27/2021    3:23 PM 10/25/2020    2:49 PM 10/19/2019    9:41 AM  PHQ 2/9 Scores  PHQ - 2 Score 0 0 0 0 0 0 0  PHQ- 9 Score      1 2    Fall Risk    02/15/2023    3:20 PM 02/02/2022    3:24 PM 06/09/2021    1:08 PM 06/09/2021    1:00 PM 01/27/2021    3:29 PM  Fall Risk   Falls in the past year? 0 0 0 0 0  Number falls in past yr: 0 0   0  Injury with Fall? 0 0   0  Risk for fall due to : Impaired vision Impaired vision   Impaired vision;Impaired balance/gait  Risk for fall due to: Comment     a little wobble at times  Follow up Falls prevention discussed Falls prevention discussed   Falls prevention discussed    FALL RISK PREVENTION PERTAINING TO THE HOME:  Any stairs in or around the home? Yes  If so, are there any without handrails? No  Home free of loose throw rugs in walkways, pet beds, electrical cords, etc? Yes  Adequate lighting in your home to reduce risk of falls? Yes   ASSISTIVE DEVICES UTILIZED TO PREVENT FALLS:  Life alert? No  Use of a cane, walker or w/c? No  Grab bars in the bathroom? No  Shower chair or bench in shower? No  Elevated toilet seat or a handicapped toilet? No   TIMED UP AND GO:  Was the test performed? Yes .  Length of time to ambulate 10 feet: 10 sec.   Gait steady and fast without use of assistive device  Cognitive  Function:        02/15/2023    3:21 PM 02/02/2022    3:26 PM 01/27/2021    3:33 PM  6CIT Screen  What Year? 0 points 0 points 0 points  What month? 0 points 0 points 0 points  What time? 0 points 0 points   Count back from 20 0 points 0 points 0 points  Months in reverse 0 points 0 points 0 points  Repeat phrase 0 points 0 points 0 points  Total Score 0 points 0 points     Immunizations Immunization History  Administered Date(s) Administered   Fluad Quad(high Dose 65+) 06/16/2019, 06/17/2020, 07/11/2021, 07/27/2022   Influenza Split 09/10/2011   Influenza, High Dose Seasonal PF 08/15/2018   Influenza,inj,Quad PF,6+ Mos 10/08/2017  Pneumococcal Conjugate-13 01/22/2015   Pneumococcal Polysaccharide-23 01/26/2006   Td 01/26/2005   Tdap 10/10/2017   Zoster, Live 01/22/2015    TDAP status: Up to date  Flu Vaccine status: Up to date  Pneumococcal vaccine status: Up to date  Covid-19 vaccine status: Completed vaccines  Qualifies for Shingles Vaccine? Yes   Zostavax completed No   Shingrix Completed?: No.    Education has been provided regarding the importance of this vaccine. Patient has been advised to call insurance company to determine out of pocket expense if they have not yet received this vaccine. Advised may also receive vaccine at local pharmacy or Health Dept. Verbalized acceptance and understanding.  Screening Tests Health Maintenance  Topic Date Due   FOOT EXAM  07/28/2022   HEMOGLOBIN A1C  01/26/2023   INFLUENZA VACCINE  04/29/2023   OPHTHALMOLOGY EXAM  08/01/2023   Diabetic kidney evaluation - Urine ACR  08/28/2023   Diabetic kidney evaluation - eGFR measurement  11/25/2023   Medicare Annual Wellness (AWV)  02/15/2024   MAMMOGRAM  06/26/2024   DTaP/Tdap/Td (3 - Td or Tdap) 10/11/2027   Pneumonia Vaccine 76+ Years old  Completed   DEXA SCAN  Completed   HPV VACCINES  Aged Out   COVID-19 Vaccine  Discontinued   Zoster Vaccines- Shingrix  Discontinued     Health Maintenance  Health Maintenance Due  Topic Date Due   FOOT EXAM  07/28/2022   HEMOGLOBIN A1C  01/26/2023    Colorectal cancer screening: No longer required.   Mammogram status: Completed 06/26/22. Repeat every year  Bone Density status: Completed 11/01/20. Results reflect: Bone density results: OSTEOPENIA. Repeat every 2 years.   Additional Screening:  Vision Screening: Recommended annual ophthalmology exams for early detection of glaucoma and other disorders of the eye. Is the patient up to date with their annual eye exam?  Yes  Who is the provider or what is the name of the office in which the patient attends annual eye exams? [facility]  If pt is not established with a provider, would they like to be referred to a provider to establish care? No .   Dental Screening: Recommended annual dental exams for proper oral hygiene  Community Resource Referral / Chronic Care Management: CRR required this visit?  No   CCM required this visit?  No      Plan:     I have personally reviewed and noted the following in the patient's chart:   Medical and social history Use of alcohol, tobacco or illicit drugs  Current medications and supplements including opioid prescriptions. Patient is not currently taking opioid prescriptions. Functional ability and status Nutritional status Physical activity Advanced directives List of other physicians Hospitalizations, surgeries, and ER visits in previous 12 months Vitals Screenings to include cognitive, depression, and falls Referrals and appointments  In addition, I have reviewed and discussed with patient certain preventive protocols, quality metrics, and best practice recommendations. A written personalized care plan for preventive services as well as general preventive health recommendations were provided to patient.     Marzella Schlein, LPN   [id number]/61/0960   Nurse Notes: none

## 2023-02-15 NOTE — Patient Instructions (Signed)
Marie Rhodes , Thank you for taking time to come for your Medicare Wellness Visit. I appreciate your ongoing commitment to your health goals. Please review the following plan we discussed and let me know if I can assist you in the future.   These are the goals we discussed:  Goals      Patient Stated     Lose weight      Patient Stated     Continue losing weight         This is a list of the screening recommended for you and due dates:  Health Maintenance  Topic Date Due   Complete foot exam   07/28/2022   Hemoglobin A1C  01/26/2023   Flu Shot  04/29/2023   Eye exam for diabetics  08/01/2023   Yearly kidney health urinalysis for diabetes  08/28/2023   Yearly kidney function blood test for diabetes  11/25/2023   Medicare Annual Wellness Visit  02/15/2024   Mammogram  06/26/2024   DTaP/Tdap/Td vaccine (3 - Td or Tdap) 10/11/2027   Pneumonia Vaccine  Completed   DEXA scan (bone density measurement)  Completed   HPV Vaccine  Aged Out   COVID-19 Vaccine  Discontinued   Zoster (Shingles) Vaccine  Discontinued    Advanced directives: copies in chart   Conditions/risks identified: continue in good health  Next appointment: Follow up in one year for your annual wellness visit    Preventive Care 65 Years and Older, Female Preventive care refers to lifestyle choices and visits with your health care provider that can promote health and wellness. What does preventive care include? A yearly physical exam. This is also called an annual well check. Dental exams once or twice a year. Routine eye exams. Ask your health care provider how often you should have your eyes checked. Personal lifestyle choices, including: Daily care of your teeth and gums. Regular physical activity. Eating a healthy diet. Avoiding tobacco and drug use. Limiting alcohol use. Practicing safe sex. Taking low-dose aspirin every day. Taking vitamin and mineral supplements as recommended by your health care  provider. What happens during an annual well check? The services and screenings done by your health care provider during your annual well check will depend on your age, overall health, lifestyle risk factors, and family history of disease. Counseling  Your health care provider may ask you questions about your: Alcohol use. Tobacco use. Drug use. Emotional well-being. Home and relationship well-being. Sexual activity. Eating habits. History of falls. Memory and ability to understand (cognition). Work and work Astronomer. Reproductive health. Screening  You may have the following tests or measurements: Height, weight, and BMI. Blood pressure. Lipid and cholesterol levels. These may be checked every 5 years, or more frequently if you are over 49 years old. Skin check. Lung cancer screening. You may have this screening every year starting at age 42 if you have a 30-pack-year history of smoking and currently smoke or have quit within the past 15 years. Fecal occult blood test (FOBT) of the stool. You may have this test every year starting at age 23. Flexible sigmoidoscopy or colonoscopy. You may have a sigmoidoscopy every 5 years or a colonoscopy every 10 years starting at age 69. Hepatitis C blood test. Hepatitis B blood test. Sexually transmitted disease (STD) testing. Diabetes screening. This is done by checking your blood sugar (glucose) after you have not eaten for a while (fasting). You may have this done every 1-3 years. Bone density scan. This is done  to screen for osteoporosis. You may have this done starting at age 65. Mammogram. This may be done every 1-2 years. Talk to your health care provider about how often you should have regular mammograms. Talk with your health care provider about your test results, treatment options, and if necessary, the need for more tests. Vaccines  Your health care provider may recommend certain vaccines, such as: Influenza vaccine. This is  recommended every year. Tetanus, diphtheria, and acellular pertussis (Tdap, Td) vaccine. You may need a Td booster every 10 years. Zoster vaccine. You may need this after age 24. Pneumococcal 13-valent conjugate (PCV13) vaccine. One dose is recommended after age 69. Pneumococcal polysaccharide (PPSV23) vaccine. One dose is recommended after age 82. Talk to your health care provider about which screenings and vaccines you need and how often you need them. This information is not intended to replace advice given to you by your health care provider. Make sure you discuss any questions you have with your health care provider. Document Released: 10/11/2015 Document Revised: 06/03/2016 Document Reviewed: 07/16/2015 Elsevier Interactive Patient Education  2017 [employer].  Fall Prevention in the Home Falls can cause injuries. They can happen to people of all ages. There are many things you can do to make your home safe and to help prevent falls. What can I do on the outside of my home? Regularly fix the edges of walkways and driveways and fix any cracks. Remove anything that might make you trip as you walk through a door, such as a raised step or threshold. Trim any bushes or trees on the path to your home. Use bright outdoor lighting. Clear any walking paths of anything that might make someone trip, such as rocks or tools. Regularly check to see if handrails are loose or broken. Make sure that both sides of any steps have handrails. Any raised decks and porches should have guardrails on the edges. Have any leaves, snow, or ice cleared regularly. Use sand or salt on walking paths during winter. Clean up any spills in your garage right away. This includes oil or grease spills. What can I do in the bathroom? Use night lights. Install grab bars by the toilet and in the tub and shower. Do not use towel bars as grab bars. Use non-skid mats or decals in the tub or shower. If you need to sit down in  the shower, use a plastic, non-slip stool. Keep the floor dry. Clean up any water that spills on the floor as soon as it happens. Remove soap buildup in the tub or shower regularly. Attach bath mats securely with double-sided non-slip rug tape. Do not have throw rugs and other things on the floor that can make you trip. What can I do in the bedroom? Use night lights. Make sure that you have a light by your bed that is easy to reach. Do not use any sheets or blankets that are too big for your bed. They should not hang down onto the floor. Have a firm chair that has side arms. You can use this for support while you get dressed. Do not have throw rugs and other things on the floor that can make you trip. What can I do in the kitchen? Clean up any spills right away. Avoid walking on wet floors. Keep items that you use a lot in easy-to-reach places. If you need to reach something above you, use a strong step stool that has a grab bar. Keep electrical cords out of the  way. Do not use floor polish or wax that makes floors slippery. If you must use wax, use non-skid floor wax. Do not have throw rugs and other things on the floor that can make you trip. What can I do with my stairs? Do not leave any items on the stairs. Make sure that there are handrails on both sides of the stairs and use them. Fix handrails that are broken or loose. Make sure that handrails are as long as the stairways. Check any carpeting to make sure that it is firmly attached to the stairs. Fix any carpet that is loose or worn. Avoid having throw rugs at the top or bottom of the stairs. If you do have throw rugs, attach them to the floor with carpet tape. Make sure that you have a light switch at the top of the stairs and the bottom of the stairs. If you do not have them, ask someone to add them for you. What else can I do to help prevent falls? Wear shoes that: Do not have high heels. Have rubber bottoms. Are comfortable  and fit you well. Are closed at the toe. Do not wear sandals. If you use a stepladder: Make sure that it is fully opened. Do not climb a closed stepladder. Make sure that both sides of the stepladder are locked into place. Ask someone to hold it for you, if possible. Clearly mark and make sure that you can see: Any grab bars or handrails. First and last steps. Where the edge of each step is. Use tools that help you move around (mobility aids) if they are needed. These include: Canes. Walkers. Scooters. Crutches. Turn on the lights when you go into a dark area. Replace any light bulbs as soon as they burn out. Set up your furniture so you have a clear path. Avoid moving your furniture around. If any of your floors are uneven, fix them. If there are any pets around you, be aware of where they are. Review your medicines with your doctor. Some medicines can make you feel dizzy. This can increase your chance of falling. Ask your doctor what other things that you can do to help prevent falls. This information is not intended to replace advice given to you by your health care provider. Make sure you discuss any questions you have with your health care provider. Document Released: 07/11/2009 Document Revised: 02/20/2016 Document Reviewed: 10/19/2014 Elsevier Interactive Patient Education  2017 [employer].

## 2023-04-08 ENCOUNTER — Telehealth: Payer: Self-pay | Admitting: *Deleted

## 2023-04-08 NOTE — Telephone Encounter (Signed)
Returned patient's phone call, spoke with patient 

## 2023-04-22 ENCOUNTER — Ambulatory Visit (HOSPITAL_COMMUNITY)
Admission: RE | Admit: 2023-04-22 | Discharge: 2023-04-22 | Disposition: A | Discharge status: Home / Self Care | Payer: Medicare PPO | Source: Ambulatory Visit | Attending: Urology | Admitting: Urology

## 2023-04-22 DIAGNOSIS — I700 Atherosclerosis of aorta: Secondary | ICD-10-CM | POA: Diagnosis not present

## 2023-04-22 DIAGNOSIS — E0800 Diabetes mellitus due to underlying condition with hyperosmolarity without nonketotic hyperglycemic-hyperosmolar coma (NKHHC): Secondary | ICD-10-CM | POA: Diagnosis not present

## 2023-04-22 DIAGNOSIS — C3490 Malignant neoplasm of unspecified part of unspecified bronchus or lung: Secondary | ICD-10-CM | POA: Diagnosis not present

## 2023-04-22 DIAGNOSIS — C7A090 Malignant carcinoid tumor of the bronchus and lung: Secondary | ICD-10-CM | POA: Diagnosis not present

## 2023-04-22 DIAGNOSIS — E049 Nontoxic goiter, unspecified: Secondary | ICD-10-CM | POA: Diagnosis not present

## 2023-04-22 LAB — POCT I-STAT CREATININE: Creatinine, Ser: 1.4 mg/dL — ABNORMAL HIGH (ref 0.44–1.00)

## 2023-04-22 MED ORDER — SODIUM CHLORIDE (PF) 0.9 % IJ SOLN
INTRAMUSCULAR | Status: AC
  Filled 2023-04-22: qty 50

## 2023-04-22 MED ORDER — IOHEXOL 300 MG/ML  SOLN
60.0000 mL | Freq: Once | INTRAMUSCULAR | Status: AC | PRN
  Administered 2023-04-22: 60 mL via INTRAVENOUS

## 2023-04-29 ENCOUNTER — Encounter: Payer: Self-pay | Admitting: Urology

## 2023-04-29 ENCOUNTER — Ambulatory Visit
Admission: RE | Admit: 2023-04-29 | Discharge: 2023-04-29 | Disposition: A | Discharge status: Home / Self Care | Payer: Medicare PPO | Source: Ambulatory Visit | Attending: Urology | Admitting: Urology

## 2023-04-29 VITALS — Ht 66.0 in | Wt 199.0 lb

## 2023-04-29 DIAGNOSIS — Z87891 Personal history of nicotine dependence: Secondary | ICD-10-CM | POA: Diagnosis not present

## 2023-04-29 DIAGNOSIS — C7A090 Malignant carcinoid tumor of the bronchus and lung: Secondary | ICD-10-CM

## 2023-04-29 DIAGNOSIS — C3401 Malignant neoplasm of right main bronchus: Secondary | ICD-10-CM | POA: Diagnosis not present

## 2023-04-29 NOTE — Progress Notes (Signed)
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  The patient was notified in advance and gave permission to proceed with this visit format. She tolerated radiation treatment relatively well with only modest fatigue. She had a recent post-treatment CT Chest on 04/22/23 which shows a stable appearance of the treated RML lung nodule with surrounding postradiation changes but no new nodules, lymphadenopathy or other findings to suggest disease recurrence or progression. We reviewed these findings by telephone today.                             On review of systems, the patient states that she is doing very well in general and is without complaints. She denies any increased shortness of breath, productive cough, hemoptysis, chest pain, fever or chills. She has a healthy appetite and is maintaining her weight and staying active. Overall, she is quite pleased with her progress to date.  ALLERGIES:  is allergic to tylenol with codeine #3 [acetaminophen-codeine].  Meds: Current Outpatient Medications  Medication Sig Dispense Refill   acetaminophen (TYLENOL) 500 MG tablet Take 1,000 mg by mouth every 6 (six) hours as needed for moderate pain.     ALPRAZolam (XANAX) 0.25 MG tablet Take 1 tablet (0.25 mg total) by mouth at bedtime as needed for anxiety. 30 tablet 0   alum & mag  hydroxide-simeth (MAALOX/MYLANTA) 200-200-20 MG/5ML suspension Take 15 mLs by mouth every 6 (six) hours as needed for indigestion or heartburn.     cholecalciferol (VITAMIN D3) 25 MCG (1000 UNIT) tablet Take 1,000 Units by mouth daily.     dapagliflozin propanediol (FARXIGA) 5 MG TABS tablet TAKE 1 TABLET EVERY DAY 90 tablet 1   ferrous sulfate 325 (65 FE) MG tablet Take 1 tablet (325 mg total) by mouth 2 (two) times daily.  3   glucose blood (ONETOUCH VERIO) test strip 1 each by Other route as needed for other. And lancets 1/day (Patient not taking: Reported on 02/15/2023) 100 each 12   glucose blood test strip  (Patient not taking: Reported on 02/15/2023)     lidocaine (ASPERCREME LIDOCAINE) 4 % Place 1 patch onto the skin every Wednesday.     losartan (COZAAR) 25 MG tablet TAKE ONE TABLET BY MOUTH DAILY 30 tablet 5   Multiple Vitamin (MULTIVITAMIN) tablet Take 1 tablet by mouth at bedtime.     pravastatin (PRAVACHOL) 20 MG tablet Take 1 tablet (20 mg total) by mouth daily. 90 tablet 1   repaglinide (PRANDIN) 1 MG tablet Take 1 tablet (1 mg total) by mouth 2 (two) times daily before a meal. 180 tablet 1   vitamin B-12 (CYANOCOBALAMIN) 1000 MCG tablet Take 1,000 mcg by mouth daily.     No current facility-administered medications for this encounter.  Physical Findings:  height is 5\' 6"  (1.676 m) and weight is 199 lb (90.3 kg).  Pain Assessment Pain Score: 0-No pain/10  Unable to assess due to telephone follow up visit format.  Lab Findings: Lab Results  Component Value Date   WBC 7.9 11/24/2022   HGB 14.7 11/24/2022   HCT 42.6 11/24/2022   MCV 92.4 11/24/2022   PLT 194 11/24/2022     Radiographic Findings: CT CHEST W CONTRAST  Result Date: 04/28/2023 CLINICAL DATA:  Non-small cell lung cancer; * Tracking Code: BO * EXAM: CT CHEST WITH CONTRAST TECHNIQUE: Multidetector CT imaging of the chest was performed during intravenous contrast administration. RADIATION DOSE REDUCTION: This  exam was performed according to the departmental dose-optimization program which includes automated exposure control, adjustment of the mA and/or kV according to patient size and/or use of iterative reconstruction technique. CONTRAST:  60mL OMNIPAQUE IOHEXOL 300 MG/ML  SOLN COMPARISON:  Chest CT dated October 25, 2022 FINDINGS: Cardiovascular: Normal heart size. Trace pericardial effusion. Normal caliber thoracic aorta with moderate atherosclerotic disease. Dilated main pulmonary artery, unchanged when compared with the prior exam. Mediastinum/Nodes: Esophagus is unremarkable. Heterogeneous thyroid with enlargement of the left thyroid lobe, unchanged when compared with the prior exam. No enlarged lymph nodes seen in the chest. Lungs/Pleura: Solid right middle lobe nodule measuring 2.6 x 2.2 cm, unchanged when compared with the prior exam. Persistent occlusion of the lateral distal right middle lobe bronchus. New adjacent irregular linear consolidations are seen in the right hemithorax. Stable small scattered pulmonary nodules. Reference subpleural nodule of the left upper lobe measuring 3 mm on series 7, image 41. No pleural effusion. Upper Abdomen: Stable low-attenuation nodule of the right adrenal gland measuring 14 mm, consistent with benign adenoma. Musculoskeletal: No chest wall abnormality. No acute or significant osseous findings. IMPRESSION: 1. Stable size of the right middle lobe nodule. New adjacent irregular linear consolidations, likely postradiation change. 2. Stable small scattered pulmonary nodules. 3. No enlarged lymph nodes seen in the chest. 4. Aortic Atherosclerosis (ICD10-I70.0). Electronically Signed   By: Allegra Lai M.D.   On: 04/28/2023 10:43    Impression/Plan: 1. 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 She has recovered well from the effects of her recent SBRT and is currently without complaints.  Her recent posttreatment CT chest  from 04/22/2023 shows a stable appearance of the treated RML lung nodule so we will plan to obtain a repeat CT chest scan in 4 months and pending this scan is stable, we will then move to serial CT chest scans every 6 months until we reach 5 years disease-free, at which time we will then transition to annual low-dose screening scans.  I will plan to call her following each scan to review results and any recommendations and she knows to call at anytime in the interim with any questions or concerns.. We will also keep her pulmonologist, Dr. Tonia Brooms, informed of her progress.     I personally spent 30 minutes in this encounter including chart review, reviewing radiological studies, telephone conversation with the patient, entering orders, coordinating care and completing documentation.     Marguarite Arbour, PA-C

## 2023-04-29 NOTE — Progress Notes (Signed)
Telephone nursing appointment for patient to review most recent scan results from 04/22/2023. I verified patient's identity x2 and began nursing interview.   Patient reports occasional soft cough but is doing well. No other issues conveyed at this time.   Meaningful use complete.   Patient aware of their 8:30am-04/29/2023 telephone appointment w/ Ashlyn Bruning PA-C. I left my extension [phone] in case patient needs anything. Patient verbalized understanding. This concludes the nursing interview.   Patient contact [phone]     Ruel Favors, LPN

## 2023-05-12 ENCOUNTER — Ambulatory Visit: Payer: Medicare PPO | Admitting: Urology

## 2023-05-13 ENCOUNTER — Other Ambulatory Visit: Payer: Self-pay | Admitting: Physician Assistant

## 2023-05-13 ENCOUNTER — Ambulatory Visit: Payer: Medicare PPO | Admitting: Urology

## 2023-05-17 ENCOUNTER — Other Ambulatory Visit: Payer: Self-pay | Admitting: Physician Assistant

## 2023-05-24 DIAGNOSIS — J208 Acute bronchitis due to other specified organisms: Secondary | ICD-10-CM | POA: Diagnosis not present

## 2023-05-24 DIAGNOSIS — R0981 Nasal congestion: Secondary | ICD-10-CM | POA: Diagnosis not present

## 2023-05-24 DIAGNOSIS — R051 Acute cough: Secondary | ICD-10-CM | POA: Diagnosis not present

## 2023-06-28 ENCOUNTER — Other Ambulatory Visit: Payer: Self-pay | Admitting: Internal Medicine

## 2023-06-28 DIAGNOSIS — E119 Type 2 diabetes mellitus without complications: Secondary | ICD-10-CM

## 2023-07-29 ENCOUNTER — Other Ambulatory Visit: Payer: Self-pay | Admitting: Physician Assistant

## 2023-09-07 ENCOUNTER — Telehealth: Payer: Self-pay | Admitting: *Deleted

## 2023-09-07 NOTE — Telephone Encounter (Signed)
CALLED PATIENT TO INFORM OF CT FOR 09-14-23- ARRIVAL TIME- 3:45 PM @ [facility], NO RESTRICTIONS TO SCAN, PATIENT TO BE CALLED ON 09-16-23 @ 10 AM WITH RESULTS BY ASHLYN BRUNING, SPOKE WITH PATIENT AND SHE IS AWARE OF THESE APPTS. AND THE INSTRUCTIONS

## 2023-09-10 ENCOUNTER — Ambulatory Visit: Payer: Medicare PPO | Admitting: Internal Medicine

## 2023-09-10 DIAGNOSIS — R509 Fever, unspecified: Secondary | ICD-10-CM | POA: Diagnosis not present

## 2023-09-10 DIAGNOSIS — R059 Cough, unspecified: Secondary | ICD-10-CM | POA: Diagnosis not present

## 2023-09-14 ENCOUNTER — Ambulatory Visit (HOSPITAL_COMMUNITY)
Admission: RE | Admit: 2023-09-14 | Discharge: 2023-09-14 | Disposition: A | Discharge status: Home / Self Care | Payer: Medicare PPO | Source: Ambulatory Visit | Attending: Urology | Admitting: Urology

## 2023-09-14 DIAGNOSIS — C7A090 Malignant carcinoid tumor of the bronchus and lung: Secondary | ICD-10-CM | POA: Diagnosis not present

## 2023-09-14 DIAGNOSIS — E042 Nontoxic multinodular goiter: Secondary | ICD-10-CM | POA: Diagnosis not present

## 2023-09-14 DIAGNOSIS — J984 Other disorders of lung: Secondary | ICD-10-CM | POA: Diagnosis not present

## 2023-09-14 DIAGNOSIS — I700 Atherosclerosis of aorta: Secondary | ICD-10-CM | POA: Diagnosis not present

## 2023-09-14 DIAGNOSIS — C7A8 Other malignant neuroendocrine tumors: Secondary | ICD-10-CM | POA: Diagnosis not present

## 2023-09-14 LAB — POCT I-STAT CREATININE: Creatinine, Ser: 1.4 mg/dL — ABNORMAL HIGH (ref 0.44–1.00)

## 2023-09-14 MED ORDER — IOHEXOL 300 MG/ML  SOLN
75.0000 mL | Freq: Once | INTRAMUSCULAR | Status: AC | PRN
  Administered 2023-09-14: 60 mL via INTRAVENOUS

## 2023-09-16 ENCOUNTER — Encounter: Payer: Self-pay | Admitting: Urology

## 2023-09-16 ENCOUNTER — Ambulatory Visit
Admission: RE | Admit: 2023-09-16 | Discharge: 2023-09-16 | Disposition: A | Discharge status: Home / Self Care | Payer: Medicare PPO | Source: Ambulatory Visit | Attending: Urology | Admitting: Urology

## 2023-09-16 DIAGNOSIS — C7A090 Malignant carcinoid tumor of the bronchus and lung: Secondary | ICD-10-CM

## 2023-09-16 DIAGNOSIS — C3401 Malignant neoplasm of right main bronchus: Secondary | ICD-10-CM | POA: Diagnosis not present

## 2023-09-16 DIAGNOSIS — Z87891 Personal history of nicotine dependence: Secondary | ICD-10-CM | POA: Diagnosis not present

## 2023-09-16 NOTE — Progress Notes (Signed)
Telephone nursing appointment for review of most recent CT-results. I verified patient's identity x2 and began nursing interview.   Patient reports doing well. Patient denies any related issues at this time.   Meaningful use complete.   Patient aware of their 10am-09/16/2023 telephone appointment w/ Ashlyn Bruning PA-C. I left my extension [phone] in case patient needs anything. Patient verbalized understanding. This concludes the nursing interview.   Patient contact [phone]     Ruel Favors, LPN

## 2023-09-16 NOTE — Progress Notes (Signed)
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  The patient was notified in advance and gave permission to proceed with this visit format.  She tolerated radiation treatment relatively well with only modest fatigue. She had a recent post-treatment CT Chest on 09/14/23 which shows a stable appearance of the treated RML lung nodule with evolving postradiation changes but no new nodules, lymphadenopathy or other findings to suggest disease recurrence or progression. We reviewed these findings by telephone today.                             On review of systems, the patient states that she continues doing very well in general and is without complaints. She denies any increased shortness of breath, productive cough, hemoptysis, chest pain, fever or chills. She has a healthy appetite and is maintaining her weight and staying active. Overall, she is quite pleased with her progress to date.  ALLERGIES:  is allergic to tylenol with codeine #3 [acetaminophen-codeine].  Meds: Current Outpatient Medications  Medication Sig Dispense Refill   acetaminophen (TYLENOL) 500 MG tablet Take 1,000 mg by mouth every 6 (six) hours as needed for moderate pain.     ALPRAZolam (XANAX) 0.25 MG tablet Take 1 tablet (0.25 mg total) by mouth at bedtime as needed for anxiety. 30  tablet 0   alum & mag hydroxide-simeth (MAALOX/MYLANTA) 200-200-20 MG/5ML suspension Take 15 mLs by mouth every 6 (six) hours as needed for indigestion or heartburn.     cholecalciferol (VITAMIN D3) 25 MCG (1000 UNIT) tablet Take 1,000 Units by mouth daily.     dapagliflozin propanediol (FARXIGA) 5 MG TABS tablet TAKE 1 TABLET EVERY DAY 90 tablet 0   ferrous sulfate 325 (65 FE) MG tablet Take 1 tablet (325 mg total) by mouth 2 (two) times daily.  3   glucose blood (ONETOUCH VERIO) test strip 1 each by Other route as needed for other. And lancets 1/day (Patient not taking: Reported on 02/15/2023) 100 each 12   glucose blood test strip  (Patient not taking: Reported on 02/15/2023)     lidocaine (ASPERCREME LIDOCAINE) 4 % Place 1 patch onto the skin every Wednesday.     losartan (COZAAR) 25 MG tablet TAKE ONE TABLET BY MOUTH DAILY 30 tablet 5   Multiple Vitamin (MULTIVITAMIN) tablet Take 1 tablet by mouth at bedtime.     pravastatin (PRAVACHOL) 20 MG tablet Take 1 tablet (20 mg total) by mouth daily. 90 tablet 1   repaglinide (PRANDIN) 1 MG tablet Take 1 tablet (1 mg total) by mouth 2 (two) times daily before a meal. 180 tablet 1   vitamin B-12 (CYANOCOBALAMIN) 1000 MCG tablet Take 1,000 mcg by mouth daily.     No current facility-administered  medications for this encounter.    Physical Findings:  vitals were not taken for this visit.  Pain Assessment Pain Score: 0-No pain/10  Unable to assess due to telephone follow up visit format.  Lab Findings: Lab Results  Component Value Date   WBC 7.9 11/24/2022   HGB 14.7 11/24/2022   HCT 42.6 11/24/2022   MCV 92.4 11/24/2022   PLT 194 11/24/2022     Radiographic Findings: CT Chest W Contrast Result Date: 09/16/2023 CLINICAL DATA:  Neuroendocrine tumor of the right lung. Restaging. * Tracking Code: BO * EXAM: CT CHEST WITH CONTRAST TECHNIQUE: Multidetector CT imaging of the chest was performed during intravenous contrast administration.  RADIATION DOSE REDUCTION: This exam was performed according to the departmental dose-optimization program which includes automated exposure control, adjustment of the mA and/or kV according to patient size and/or use of iterative reconstruction technique. CONTRAST:  60mL OMNIPAQUE IOHEXOL 300 MG/ML  SOLN COMPARISON:  04/22/2023 FINDINGS: Cardiovascular: The heart size is normal. No substantial pericardial effusion. Mild atherosclerotic calcification is noted in the wall of the thoracic aorta. Enlargement of the pulmonary outflow tract/main pulmonary arteries suggests pulmonary arterial hypertension. Mediastinum/Nodes: Bilateral thyroid nodules again noted. This has been evaluated on previous imaging. (ref: J Am Coll Radiol. 2015 Feb;12(2): 143-50).There is no hilar lymphadenopathy. The esophagus has normal imaging features. There is no axillary lymphadenopathy. Lungs/Pleura: Interval progression of bandlike consolidative opacity in volume loss in the parahilar right lung, now more confluent in the right middle lobe with progression of right middle lobe volume loss. These changes now somewhat incorporate the right middle lobe pulmonary nodule which is partially discernible on image 78 of series 6 measuring approximately 1.9 x 1.7 cm which compares to 2.6 x 2.2 cm previously. Tiny additional pulmonary nodule seen previously are stable. No new suspicious pulmonary nodule or mass. There is no evidence of pleural effusion. Upper Abdomen: Stable 14 mm right adrenal nodule compatible with adenoma. No followup imaging is recommended. Musculoskeletal: No worrisome lytic or sclerotic osseous abnormality. IMPRESSION: 1. Interval progression of bandlike consolidative opacity and volume loss in the parahilar right lung, now more confluent in the right middle lobe with progression of right middle lobe volume loss. These probable post treatment changes now somewhat incorporate the right middle lobe pulmonary nodule which is  partially discernible measuring approximately 1.9 x 1.7 cm which compares to 2.6 x 2.2 cm previously. No new suspicious pulmonary nodule or mass. 2. Enlargement of the pulmonary outflow tract/main pulmonary arteries suggests pulmonary arterial hypertension. 3.  Aortic Atherosclerosis (ICD10-I70.0). Electronically Signed   By: Kennith Center M.D.   On: 09/16/2023 09:01    Impression/Plan: 1. 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 She has recovered well from the effects of her recent SBRT and is currently without complaints.  Her recent posttreatment CT chest from 09/14/2023 shows a stable appearance of the treated RML lung nodule so we will plan to move to serial CT chest scans every 6 months until we reach 5 years disease-free, at which time we will then transition to annual low-dose screening scans.  I will plan to call her following each scan to review results and any recommendations and she knows to call at anytime in the interim with any questions or concerns.. We will also keep her pulmonologist, Dr. Tonia Brooms, informed of her progress.     I personally spent 30 minutes in this encounter including chart review, reviewing radiological studies, telephone  conversation with the patient, entering orders, coordinating care and completing documentation.     Marguarite Arbour, PA-C

## 2023-10-06 ENCOUNTER — Other Ambulatory Visit: Payer: Self-pay | Admitting: Internal Medicine

## 2023-10-06 DIAGNOSIS — E119 Type 2 diabetes mellitus without complications: Secondary | ICD-10-CM

## 2023-10-12 DIAGNOSIS — E119 Type 2 diabetes mellitus without complications: Secondary | ICD-10-CM

## 2023-10-18 MED ORDER — DAPAGLIFLOZIN PROPANEDIOL 5 MG PO TABS: 5.0000 mg | ORAL_TABLET | Freq: Every day | ORAL | 3 refills | Status: DC

## 2023-10-18 NOTE — Telephone Encounter (Signed)
Copied from CRM [id number]. Topic: General - Other >> Oct 18, 2023  9:47 AM Kathryne Eriksson wrote: Reason for CRM: Medication >> Oct 18, 2023  9:50 AM Kathryne Eriksson wrote: Patient states she received a text from [facility] in regards to a medication that was suppose to be sent over to [facility]. Patient is wanting a follow up on this, to ensure it has been completed. Patient is requesting a call back from either Jarold Motto PA herself or her nurse. Call back number [phone]

## 2023-10-18 NOTE — Telephone Encounter (Signed)
Spoke to pt asked her what medication she needed? Pt said Marcelline Deist, she said [facility] sent a message. Told her we did not receive a message. I will gladly send medication over to [facility]. Pt verbalized understanding. Rx sent.

## 2023-10-20 NOTE — Telephone Encounter (Signed)
Called [facility] and spoke to [place], told her pt's Rx for Marcelline Deist was sent to [facility] on 1/20 and was received. Pt was aware. Delorise Shiner verbalized understanding.

## 2023-10-20 NOTE — Telephone Encounter (Unsigned)
Copied from CRM [id number]. Topic: Clinical - Prescription Issue >> Oct 20, 2023  3:43 PM Deaijah H wrote: Reason for CRM: [facility] called in to have medication dapagliflozin propanediol (FARXIGA) 5 MG TABS tablet to be sent to [facility] - [address] [place] [address] / please call [phone]

## 2023-11-12 ENCOUNTER — Other Ambulatory Visit: Payer: Self-pay | Admitting: Physician Assistant

## 2023-11-25 ENCOUNTER — Other Ambulatory Visit: Payer: Self-pay | Admitting: Physician Assistant

## 2023-12-06 ENCOUNTER — Telehealth: Payer: Self-pay | Admitting: *Deleted

## 2023-12-06 NOTE — Telephone Encounter (Signed)
 RETURNED PATIENT'S PHONE CALL, SPOKE WITH PATIENT. ?

## 2024-01-24 ENCOUNTER — Other Ambulatory Visit: Payer: Self-pay | Admitting: Physician Assistant

## 2024-01-31 DIAGNOSIS — N1832 Chronic kidney disease, stage 3b: Secondary | ICD-10-CM | POA: Diagnosis not present

## 2024-01-31 LAB — VITAMIN D 25 HYDROXY (VIT D DEFICIENCY, FRACTURES): Vit D, 25-Hydroxy: 27

## 2024-01-31 LAB — CBC AND DIFFERENTIAL: Hemoglobin: 16.9 — AB (ref 12.0–16.0)

## 2024-01-31 LAB — COMPREHENSIVE METABOLIC PANEL WITH GFR
Calcium: 10.6 (ref 8.7–10.7)
eGFR: 49

## 2024-01-31 LAB — BASIC METABOLIC PANEL WITH GFR
Creatinine: 1.1 (ref 0.5–1.1)
Potassium: 4.9 meq/L (ref 3.5–5.1)

## 2024-02-07 DIAGNOSIS — L603 Nail dystrophy: Secondary | ICD-10-CM | POA: Diagnosis not present

## 2024-02-07 DIAGNOSIS — L814 Other melanin hyperpigmentation: Secondary | ICD-10-CM | POA: Diagnosis not present

## 2024-02-07 DIAGNOSIS — Z85828 Personal history of other malignant neoplasm of skin: Secondary | ICD-10-CM | POA: Diagnosis not present

## 2024-02-07 DIAGNOSIS — L570 Actinic keratosis: Secondary | ICD-10-CM | POA: Diagnosis not present

## 2024-02-07 DIAGNOSIS — L821 Other seborrheic keratosis: Secondary | ICD-10-CM | POA: Diagnosis not present

## 2024-02-21 ENCOUNTER — Ambulatory Visit: Payer: Medicare PPO

## 2024-03-09 ENCOUNTER — Telehealth: Payer: Self-pay | Admitting: *Deleted

## 2024-03-09 NOTE — Telephone Encounter (Signed)
 CALLED PATIENT TO INFORM OF CT FOR 03-23-24- ARRIVAL TIME- 1:45 PM @ [facility], NO RESTRICTIONS TO SCAN, PATIENT TO BE CALLED WITH RESULTS BY ASHLYN BRUNING ON 03-30-24 @ 10 AM, SPOKE WITH PATIENT AND SHE IS AWARE OF THESE APPTS. AND THE INSTRUCTIONS

## 2024-03-14 DIAGNOSIS — J189 Pneumonia, unspecified organism: Secondary | ICD-10-CM | POA: Diagnosis not present

## 2024-03-14 DIAGNOSIS — R051 Acute cough: Secondary | ICD-10-CM | POA: Diagnosis not present

## 2024-03-14 DIAGNOSIS — Z20822 Contact with and (suspected) exposure to covid-19: Secondary | ICD-10-CM | POA: Diagnosis not present

## 2024-03-21 NOTE — Progress Notes (Signed)
 Telephone nursing appointment for review of most recent CT-Chest results. I verified patient's identity x2 and began nursing interview.   Patient states issues as follows...  -Pain: Denies -Fatigue: Moderate -Skin: Denies -Lungs: Denies -Appetite: Good   Patient denies any other related issues at this time.   Meaningful use complete.   Patient aware of their telephone appointment w/ Ashlyn Bruning PA-C. I left my extension [phone] in case patient needs anything. Patient verbalized understanding. This concludes the nursing interview.   Patient preferred phone # [phone]   Rosaline Minerva, LPN

## 2024-03-22 ENCOUNTER — Ambulatory Visit: Payer: Medicare PPO | Admitting: Urology

## 2024-03-22 ENCOUNTER — Other Ambulatory Visit (HOSPITAL_COMMUNITY)

## 2024-03-23 ENCOUNTER — Ambulatory Visit: Admitting: Urology

## 2024-03-23 ENCOUNTER — Ambulatory Visit (HOSPITAL_COMMUNITY)
Admission: RE | Admit: 2024-03-23 | Discharge: 2024-03-23 | Disposition: A | Discharge status: Home / Self Care | Source: Ambulatory Visit | Attending: Urology | Admitting: Urology

## 2024-03-23 DIAGNOSIS — R918 Other nonspecific abnormal finding of lung field: Secondary | ICD-10-CM | POA: Diagnosis not present

## 2024-03-23 DIAGNOSIS — C7A8 Other malignant neuroendocrine tumors: Secondary | ICD-10-CM | POA: Diagnosis not present

## 2024-03-23 DIAGNOSIS — C7A00 Malignant carcinoid tumor of unspecified site: Secondary | ICD-10-CM | POA: Diagnosis not present

## 2024-03-23 DIAGNOSIS — C7A090 Malignant carcinoid tumor of the bronchus and lung: Secondary | ICD-10-CM | POA: Insufficient documentation

## 2024-03-23 LAB — POCT I-STAT CREATININE: Creatinine, Ser: 1.3 mg/dL — ABNORMAL HIGH (ref 0.44–1.00)

## 2024-03-23 MED ORDER — IOHEXOL 300 MG/ML  SOLN
75.0000 mL | Freq: Once | INTRAMUSCULAR | Status: AC | PRN
  Administered 2024-03-23: 60 mL via INTRAVENOUS

## 2024-03-30 ENCOUNTER — Ambulatory Visit
Admission: RE | Admit: 2024-03-30 | Discharge: 2024-03-30 | Disposition: A | Discharge status: Home / Self Care | Source: Ambulatory Visit | Attending: Urology | Admitting: Urology

## 2024-03-30 ENCOUNTER — Encounter: Payer: Self-pay | Admitting: Urology

## 2024-03-30 VITALS — Ht 66.0 in | Wt 197.0 lb

## 2024-03-30 DIAGNOSIS — C7A090 Malignant carcinoid tumor of the bronchus and lung: Secondary | ICD-10-CM

## 2024-03-30 DIAGNOSIS — Z87891 Personal history of nicotine dependence: Secondary | ICD-10-CM | POA: Diagnosis not present

## 2024-03-30 DIAGNOSIS — C3401 Malignant neoplasm of right main bronchus: Secondary | ICD-10-CM | POA: Diagnosis not present

## 2024-03-30 NOTE — Progress Notes (Signed)
 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  The patient was notified in advance and gave permission to proceed with this visit format.  She tolerated radiation treatment relatively well with only modest fatigue. She had a recent post-treatment CT Chest on 03/23/24 which shows an overall stable appearance of the treated RML lung nodule with evolving postradiation changes but no new nodules, lymphadenopathy or other findings to suggest disease recurrence or progression. There is some minimal increase in size of the previously treated right perihilar nodule at 2.1 x 2.2 as compared to 1.9 x 1.7 previously, likely secondary to treatment changes. We reviewed these findings by telephone today.                             On review of systems, the patient states that she continues doing very well in general and is without complaints. She denies any increased shortness of breath, productive cough, hemoptysis, chest pain, fever or chills. She has a healthy appetite and is maintaining her weight and staying active. Overall, she is quite pleased with her progress to date.  ALLERGIES:  is allergic to tylenol  with codeine  #3 [acetaminophen -codeine ].  Meds: Current Outpatient Medications  Medication Sig Dispense Refill   acetaminophen  (TYLENOL ) 500 MG tablet Take 1,000 mg by mouth every 6  (six) hours as needed for moderate pain.     ALPRAZolam  (XANAX ) 0.25 MG tablet Take 1 tablet (0.25 mg total) by mouth at bedtime as needed for anxiety. 30 tablet 0   alum & mag hydroxide-simeth (MAALOX/MYLANTA) 200-200-20 MG/5ML suspension Take 15 mLs by mouth every 6 (six) hours as needed for indigestion or heartburn.     cholecalciferol (VITAMIN D3) 25 MCG (1000 UNIT) tablet Take 1,000 Units by mouth daily.     dapagliflozin  propanediol (FARXIGA ) 5 MG TABS tablet Take 1 tablet (5 mg total) by mouth daily. 90 tablet 3   ferrous sulfate  325 (65 FE) MG tablet Take 1 tablet (325 mg total) by mouth 2 (two) times daily.  3   glucose blood test strip  (Patient not taking: Reported on 02/15/2023)     lidocaine  (ASPERCREME LIDOCAINE ) 4 % Place 1 patch onto the skin every Wednesday.     losartan  (COZAAR ) 25 MG tablet TAKE ONE TABLET BY MOUTH DAILY 30 tablet 5   Multiple Vitamin (MULTIVITAMIN) tablet Take 1 tablet by mouth at bedtime.     pravastatin  (PRAVACHOL ) 20 MG tablet Take 1 tablet (20 mg total) by mouth daily. 90 tablet 1   repaglinide  (PRANDIN ) 1 MG tablet Take 1 tablet (1 mg total) by mouth 2 (two) times daily before a meal. 180 tablet 1   vitamin B-12 (CYANOCOBALAMIN) 1000 MCG tablet Take 1,000 mcg by mouth daily.  No current facility-administered medications for this encounter.    Physical Findings:  height is 5' 6 (1.676 m) and weight is 197 lb (89.4 kg).  Pain Assessment Pain Score: 0-No pain/10  Unable to assess due to telephone follow up visit format.  Lab Findings: Lab Results  Component Value Date   WBC 7.9 11/24/2022   HGB 14.7 11/24/2022   HCT 42.6 11/24/2022   MCV 92.4 11/24/2022   PLT 194 11/24/2022     Radiographic Findings: CT Chest W Contrast Result Date: 03/28/2024 CLINICAL DATA:  Neuroendocrine tumor. Restaging carcinoid tumor of the RIGHT middle lobe. Patient status post SB RT. * Tracking Code: BO * EXAM: CT CHEST WITH CONTRAST TECHNIQUE: Multidetector CT  imaging of the chest was performed during intravenous contrast administration. RADIATION DOSE REDUCTION: This exam was performed according to the departmental dose-optimization program which includes automated exposure control, adjustment of the mA and/or kV according to patient size and/or use of iterative reconstruction technique. CONTRAST:  60mL OMNIPAQUE  IOHEXOL  300 MG/ML  SOLN COMPARISON:  CT 09/14/2023 FINDINGS: Cardiovascular: No significant vascular findings. Normal heart size. No pericardial effusion. Mediastinum/Nodes: No axillary or supraclavicular adenopathy. No mediastinal or hilar adenopathy. No pericardial fluid. Esophagus normal. Lungs/Pleura: Nodule adjacent the RIGHT hilum measures 2.1 by 2.2 cm (image 75/series 6) compared to 1.9 x 1.7 cm on most recent CT. RIGHT perihilar consolidation is improved compared to prior. Platelike consolidation remains typical of radiation change. No new pulmonary nodules within the LEFT lung or the RIGHT lung. Upper Abdomen: Limited view of the liver, kidneys, pancreas are unremarkable. Normal adrenal glands. Musculoskeletal: No aggressive osseous lesion. IMPRESSION: 1. Slight increase in size of RIGHT perihilar nodule. 2. Improved perihilar consolidation related to radiation therapy 3. No new pulmonary nodules. 4. No thoracic adenopathy. Electronically Signed   By: Jackquline Boxer M.D.   On: 03/28/2024 17:03    Impression/Plan: 1. 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 She has recovered well from the effects of her recent SBRT and is currently without complaints.  Her recent posttreatment CT chest from 03/23/24 shows an overall stable appearance of the previously treated RML lung nodule with minimal increase in size at 2.1 x 2.2 as compared to 1.9 x 1.7 previously, likely secondary to treatment changes.We will plan to continue with serial CT chest scans every 6 months until we reach 5 years  disease-free, at which time we will then transition to annual low-dose screening scans.  I will plan to call her following each scan to review results and any recommendations and she knows to call at anytime in the interim with any questions or concerns. Her pulmonologist, Dr. Brenna, has left the practice but I will share our discussion with Lauraine Lites, NP to ensure she gets plugged in with one of the other MDs in the practice for routine follow up care.     I personally spent 30 minutes in this encounter including chart review, reviewing radiological studies, telephone conversation with the patient, entering orders, coordinating care and completing documentation.     Sabra MICAEL Rusk, PA-C

## 2024-04-11 ENCOUNTER — Ambulatory Visit (INDEPENDENT_AMBULATORY_CARE_PROVIDER_SITE_OTHER)

## 2024-04-11 VITALS — BP 122/68 | HR 78 | Temp 98.1°F | Ht 66.0 in | Wt 199.0 lb

## 2024-04-11 DIAGNOSIS — Z0000 Encounter for general adult medical examination without abnormal findings: Secondary | ICD-10-CM

## 2024-04-11 NOTE — Progress Notes (Signed)
 Subjective:   Marie Rhodes is a 88 y.o. who presents for a Medicare Wellness preventive visit.  As a reminder, Annual Wellness Visits don't include a physical exam, and some assessments may be limited, especially if this visit is performed virtually. We may recommend an in-person follow-up visit with your provider if needed.  Visit Complete: In person    Persons Participating in Visit: Patient.  AWV Questionnaire: No: Patient Medicare AWV questionnaire was not completed prior to this visit.  Cardiac Risk Factors include: advanced age (>62men, >41 women);dyslipidemia;diabetes mellitus;obesity (BMI >30kg/m2);hypertension     Objective:    Today's Vitals   04/11/24 1351  BP: 122/68  Pulse: 78  Temp: 98.1 F (36.7 C)  SpO2: 93%  Weight: 199 lb (90.3 kg)  Height: 5' 6 (1.676 m)   Body mass index is 32.12 kg/m.     04/11/2024    1:58 PM 03/30/2024    9:40 AM 09/16/2023    9:28 AM 04/29/2023    8:07 AM 02/15/2023    3:19 PM 12/07/2022   10:43 AM 11/24/2022    6:36 AM  Advanced Directives  Does Patient Have a Medical Advance Directive? Yes Yes Yes Yes Yes Yes Yes  Type of Estate agent of [place];Living will  Living will;Healthcare Power of Attorney Living will;Healthcare Power of [employer] Power of [place];Living will Healthcare Power of [place];Living will Healthcare Power of Attorney  Does patient want to make changes to medical advance directive?     No - Patient declined    Copy of Healthcare Power of Attorney in Chart? No - copy requested    Yes - validated most recent copy scanned in chart (See row information)  No - copy requested    Current Medications (verified) Outpatient Encounter Medications as of 04/11/2024  Medication Sig   acetaminophen  (TYLENOL ) 500 MG tablet Take 1,000 mg by mouth every 6 (six) hours as needed for moderate pain.   ALPRAZolam  (XANAX ) 0.25 MG tablet Take 1 tablet (0.25 mg total) by mouth at bedtime as needed  for anxiety.   alum & mag hydroxide-simeth (MAALOX/MYLANTA) 200-200-20 MG/5ML suspension Take 15 mLs by mouth every 6 (six) hours as needed for indigestion or heartburn.   cholecalciferol (VITAMIN D3) 25 MCG (1000 UNIT) tablet Take 1,000 Units by mouth daily.   dapagliflozin  propanediol (FARXIGA ) 5 MG TABS tablet Take 1 tablet (5 mg total) by mouth daily.   ferrous sulfate  325 (65 FE) MG tablet Take 1 tablet (325 mg total) by mouth 2 (two) times daily.   lidocaine  (ASPERCREME LIDOCAINE ) 4 % Place 1 patch onto the skin every Wednesday.   losartan  (COZAAR ) 25 MG tablet TAKE ONE TABLET BY MOUTH DAILY   Multiple Vitamin (MULTIVITAMIN) tablet Take 1 tablet by mouth at bedtime.   pravastatin  (PRAVACHOL ) 20 MG tablet Take 1 tablet (20 mg total) by mouth daily.   repaglinide  (PRANDIN ) 1 MG tablet Take 1 tablet (1 mg total) by mouth 2 (two) times daily before a meal.   vitamin B-12 (CYANOCOBALAMIN) 1000 MCG tablet Take 1,000 mcg by mouth daily.   glucose blood test strip  (Patient not taking: Reported on 04/11/2024)   No facility-administered encounter medications on file as of 04/11/2024.    Allergies (verified) Tylenol  with codeine  #3 [acetaminophen -codeine ]   History: Past Medical History:  Diagnosis Date   ANXIETY 04/27/2007   Arthritis    right big toe   Cancer (HCC)    early stage squamous cell - leg  Chronic kidney disease    Stage 3 b - now stage 3 A   COVID 2020   high fever -   CROHN'S DISEASE, SMALL INTESTINE 06/08/2008   ileum, cecum   Diabetes mellitus without complication (HCC)    Fatty liver 1992   never had problems with it   Fatty tumor    left shoulder   GOITER, MULTINODULAR 05/04/2008   Hyperlipemia    Hypertension    Molar pregnancy 1967   OBESITY 08/01/2010   Pneumonia    Thyroid  nodule    UNSPECIFIED ANEMIA 05/04/2008   Mild Anemia   Vitamin D  deficiency 04/08/2018   Past Surgical History:  Procedure Laterality Date   APPENDECTOMY     BIOPSY THYROID       BRONCHIAL BIOPSY  11/24/2022   Procedure: BRONCHIAL BIOPSIES;  Surgeon: Brenna Adine CROME, DO;  Location: [facility];  Service: Pulmonary;;   BRONCHIAL BRUSHINGS  11/24/2022   Procedure: BRONCHIAL BRUSHINGS;  Surgeon: Brenna Adine CROME, DO;  Location: [facility];  Service: Pulmonary;;   BRONCHIAL NEEDLE ASPIRATION BIOPSY  11/24/2022   Procedure: BRONCHIAL NEEDLE ASPIRATION BIOPSIES;  Surgeon: Brenna Adine CROME, DO;  Location: [facility];  Service: Pulmonary;;   COLONOSCOPY  08/10/2006   normal   LEFT OOPHORECTOMY  1992   MECKEL DIVERTICULUM EXCISION     Ileal and cecal resection    Family History  Problem Relation Age of Onset   Breast cancer Mother 83   Kidney disease Mother        Dialysis   Diabetes Mother    Kidney cancer Mother        Renal Cell Carcinoma   Skin cancer Father        mets   Diabetes Father    Melanoma Sister    Diabetes Brother    Tongue cancer Brother 39   Stroke Maternal Grandmother    Heart disease Paternal Grandmother    Colon cancer Paternal Uncle    Social History   Socioeconomic History   Marital status: Widowed    Spouse name: Not on file   Number of children: Not on file   Years of education: Not on file   Highest education level: Not on file  Occupational History   Occupation: retired Runner, broadcasting/film/video    Comment: retired Printmaker)  Tobacco Use   Smoking status: Former    Current packs/day: 0.00    Types: Cigarettes    Quit date: 05/29/1988    Years since quitting: 35.8    Passive exposure: Past   Smokeless tobacco: Never  Vaping Use   Vaping status: Never Used  Substance and Sexual Activity   Alcohol use: No   Drug use: No   Sexual activity: Not on file  Other Topics Concern   Not on file  Social History Narrative   Patient is widowed --husband died from brain aneurysm, she is a retired Runner, 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   Former Smoker (college only), no alcohol or drug use   Both of  her grandchildren lived with her while they went to college    As of November 2019, she lives alone   Social Drivers of Corporate investment banker Strain: Low Risk  (04/11/2024)   Overall Financial Resource Strain (CARDIA)    Difficulty of Paying Living Expenses: Not hard at all  Food Insecurity: No Food Insecurity (04/11/2024)   Hunger Vital Sign    Worried About Running  Out of Food in the Last Year: Never true    Ran Out of Food in the Last Year: Never true  Transportation Needs: No Transportation Needs (04/11/2024)   PRAPARE - Administrator, Civil Service (Medical): No    Lack of Transportation (Non-Medical): No  Physical Activity: Inactive (04/11/2024)   Exercise Vital Sign    Days of Exercise per Week: 0 days    Minutes of Exercise per Session: 0 min  Stress: No Stress Concern Present (04/11/2024)   [employer] of Occupational Health - Occupational Stress Questionnaire    Feeling of Stress: Not at all  Social Connections: Socially Isolated (04/11/2024)   Social Connection and Isolation Panel    Frequency of Communication with Friends and Family: More than three times a week    Frequency of Social Gatherings with Friends and Family: More than three times a week    Attends Religious Services: Never    Database administrator or Organizations: No    Attends Banker Meetings: Never    Marital Status: Widowed    Tobacco Counseling Counseling given: Not Answered    Clinical Intake:  Pre-visit preparation completed: Yes  Pain : No/denies pain     BMI - recorded: 32.12 Nutritional Status: BMI > 30  Obese Nutritional Risks: None Diabetes: Yes CBG done?: No Did pt. bring in CBG monitor from home?: No  Lab Results  Component Value Date   HGBA1C 6.6 (A) 07/27/2022   HGBA1C 7.5 (A) 04/24/2022   HGBA1C 7.5 (A) 01/12/2022     How often do you need to have someone help you when you read instructions, pamphlets, or other written materials from  your doctor or pharmacy?: 1 - Never  Interpreter Needed?: No  Information entered by :: Ellouise Haws, LPN   Activities of Daily Living      04/11/2024    1:53 PM  In your present state of health, do you have any difficulty performing the following activities:  Hearing? 0  Vision? 0  Difficulty concentrating or making decisions? 0  Walking or climbing stairs? 0  Dressing or bathing? 0  Doing errands, shopping? 0  Preparing Food and eating ? N  Using the Toilet? N  In the past six months, have you accidently leaked urine? N  Do you have problems with loss of bowel control? N  Managing your Medications? N  Managing your Finances? N  Housekeeping or managing your Housekeeping? N    Patient Care Team: Job Lukes, [place] as PCP - General (Physician Assistant) Avram Lupita BRAVO, MD as Attending Physician (Gastroenterology) Ardeen Rollene Kass, MD (Radiology) Cary Doffing, MD as Attending Physician (Dermatology) Kassie Mallick, MD (Inactive) as Consulting Physician (Endocrinology) Leila Bound, OD as Consulting Physician (Optometry) Shelah Lamar RAMAN, MD as Consulting Physician (Pulmonary Disease)  I have updated your Care Teams any recent Medical Services you may have received from other providers in the past year.     Assessment:   This is a routine wellness examination for Liv.  Hearing/Vision screen Hearing Screening - Comments:: Pt denies any hearing loss  Vision Screening - Comments:: Wears rx glasses - up to date with routine eye exams with [facility]  Dr Bound Leila   Goals Addressed             This Visit's Progress    Patient Stated       Lose weight        Depression Screen  04/11/2024    1:54 PM 02/15/2023    3:17 PM 12/07/2022   10:51 AM 02/02/2022    3:20 PM 06/09/2021    1:08 PM 01/27/2021    3:23 PM 10/25/2020    2:49 PM  PHQ 2/9 Scores  PHQ - 2 Score 0 0 0 0 0 0 0  PHQ- 9 Score       1    Fall Risk     04/11/2024    2:00 PM  02/15/2023    3:20 PM 02/02/2022    3:24 PM 06/09/2021    1:08 PM 06/09/2021    1:00 PM  Fall Risk   Falls in the past year? 0 0 0 0 0  Number falls in past yr: 0 0 0    Injury with Fall? 0 0 0    Risk for fall due to : No Fall Risks Impaired vision Impaired vision    Follow up Falls prevention discussed Falls prevention discussed Falls prevention discussed        Data saved with a previous flowsheet row definition    MEDICARE RISK AT HOME:   Medicare Risk at Home Any stairs in or around the home?: No If so, are there any without handrails?: No Home free of loose throw rugs in walkways, pet beds, electrical cords, etc?: Yes Adequate lighting in your home to reduce risk of falls?: Yes Life alert?: No Use of a cane, walker or w/c?: No Grab bars in the bathroom?: No Shower chair or bench in shower?: No Elevated toilet seat or a handicapped toilet?: No  TIMED UP AND GO:  Was the test performed?  Yes  Length of time to ambulate 10 feet: 10 sec Gait steady and fast without use of assistive device  Cognitive Function: 6CIT completed        04/11/2024    2:01 PM 02/15/2023    3:21 PM 02/02/2022    3:26 PM 01/27/2021    3:33 PM  6CIT Screen  What Year? 0 points 0 points 0 points 0 points  What month? 0 points 0 points 0 points 0 points  What time? 0 points 0 points 0 points   Count back from 20 0 points 0 points 0 points 0 points  Months in reverse 0 points 0 points 0 points 0 points  Repeat phrase 0 points 0 points 0 points 0 points  Total Score 0 points 0 points 0 points     Immunizations Immunization History  Administered Date(s) Administered   Fluad Quad(high Dose 65+) 06/16/2019, 06/17/2020, 07/11/2021, 07/27/2022   Influenza Split 09/10/2011   Influenza, High Dose Seasonal PF 08/15/2018   Influenza,inj,Quad PF,6+ Mos 10/08/2017   Pneumococcal Conjugate-13 01/22/2015   Pneumococcal Polysaccharide-23 01/26/2006   Td 01/26/2005   Tdap 10/10/2017   Zoster, Live  01/22/2015    Screening Tests Health Maintenance  Topic Date Due   Diabetic kidney evaluation - Urine ACR  09/12/2013   FOOT EXAM  07/28/2022   HEMOGLOBIN A1C  01/26/2023   OPHTHALMOLOGY EXAM  08/01/2023   Diabetic kidney evaluation - eGFR measurement  11/25/2023   INFLUENZA VACCINE  04/28/2024   MAMMOGRAM  06/26/2024   Medicare Annual Wellness (AWV)  04/11/2025   DTaP/Tdap/Td (3 - Td or Tdap) 10/11/2027   Pneumococcal Vaccine: 50+ Years  Completed   DEXA SCAN  Completed   Hepatitis B Vaccines  Aged Out   HPV VACCINES  Aged Out   Meningococcal B Vaccine  Aged Out   COVID-19  Vaccine  Discontinued   Zoster Vaccines- Shingrix  Discontinued    Health Maintenance  Health Maintenance Due  Topic Date Due   Diabetic kidney evaluation - Urine ACR  09/12/2013   FOOT EXAM  07/28/2022   HEMOGLOBIN A1C  01/26/2023   OPHTHALMOLOGY EXAM  08/01/2023   Diabetic kidney evaluation - eGFR measurement  11/25/2023   Health Maintenance Items Addressed: See Nurse Notes at the end of this note  Additional Screening:  Vision Screening: Recommended annual ophthalmology exams for early detection of glaucoma and other disorders of the eye. Would you like a referral to an eye doctor? No    Dental Screening: Recommended annual dental exams for proper oral hygiene  Community Resource Referral / Chronic Care Management: CRR required this visit?  No   CCM required this visit?  No   Plan:    I have personally reviewed and noted the following in the patient's chart:   Medical and social history Use of alcohol, tobacco or illicit drugs  Current medications and supplements including opioid prescriptions. Patient is not currently taking opioid prescriptions. Functional ability and status Nutritional status Physical activity Advanced directives List of other physicians Hospitalizations, surgeries, and ER visits in previous 12 months Vitals Screenings to include cognitive, depression, and  falls Referrals and appointments  In addition, I have reviewed and discussed with patient certain preventive protocols, quality metrics, and best practice recommendations. A written personalized care plan for preventive services as well as general preventive health recommendations were provided to patient.   Ellouise VEAR Haws, LPN   [id number]/84/[id number]   After Visit Summary: (In Person-Printed) AVS printed and given to the patient  Notes: Nothing significant to report at this time.

## 2024-04-11 NOTE — Patient Instructions (Signed)
 Ms. Marie Rhodes , Thank you for taking time out of your busy schedule to complete your Annual Wellness Visit with me. I enjoyed our conversation and look forward to speaking with you again next year. I, as well as your care team,  appreciate your ongoing commitment to your health goals. Please review the following plan we discussed and let me know if I can assist you in the future. Your Game plan/ To Do List    Referrals: If you haven't heard from the office you've been referred to, please reach out to them at the phone provided.   Follow up Visits: Next Medicare AWV with our clinical staff: 04/19/25   Have you seen your provider in the last 6 months (3 months if uncontrolled diabetes)? No Next Office Visit with your provider: will make an appt   Clinician Recommendations:  Aim for 30 minutes of exercise or brisk walking, 6-8 glasses of water, and 5 servings of fruits and vegetables each day.        This is a list of the screening recommended for you and due dates:  Health Maintenance  Topic Date Due   Yearly kidney health urinalysis for diabetes  09/12/2013   Complete foot exam   07/28/2022   Hemoglobin A1C  01/26/2023   Eye exam for diabetics  08/01/2023   Yearly kidney function blood test for diabetes  11/25/2023   Medicare Annual Wellness Visit  02/15/2024   Flu Shot  04/28/2024   Mammogram  06/26/2024   DTaP/Tdap/Td vaccine (3 - Td or Tdap) 10/11/2027   Pneumococcal Vaccine for age over 61  Completed   DEXA scan (bone density measurement)  Completed   Hepatitis B Vaccine  Aged Out   HPV Vaccine  Aged Out   Meningitis B Vaccine  Aged Out   COVID-19 Vaccine  Discontinued   Zoster (Shingles) Vaccine  Discontinued    Advanced directives: (In Chart) A copy of your advanced directives are scanned into your chart should your provider ever need it. Advance Care Planning is important because it:  [x]  Makes sure you receive the medical care that is consistent with your values, goals, and  preferences  [x]  It provides guidance to your family and loved ones and reduces their decisional burden about whether or not they are making the right decisions based on your wishes.  Follow the link provided in your after visit summary or read over the paperwork we have mailed to you to help you started getting your Advance Directives in place. If you need assistance in completing these, please reach out to us  so that we can help you!  See attachments for Preventive Care and Fall Prevention Tips.

## 2024-04-27 DIAGNOSIS — N1831 Chronic kidney disease, stage 3a: Secondary | ICD-10-CM | POA: Diagnosis not present

## 2024-04-27 DIAGNOSIS — I129 Hypertensive chronic kidney disease with stage 1 through stage 4 chronic kidney disease, or unspecified chronic kidney disease: Secondary | ICD-10-CM | POA: Diagnosis not present

## 2024-04-27 DIAGNOSIS — E1122 Type 2 diabetes mellitus with diabetic chronic kidney disease: Secondary | ICD-10-CM | POA: Diagnosis not present

## 2024-04-27 DIAGNOSIS — E213 Hyperparathyroidism, unspecified: Secondary | ICD-10-CM | POA: Diagnosis not present

## 2024-04-27 LAB — PROTEIN / CREATININE RATIO, URINE: Creatinine, Urine: 30

## 2024-05-01 ENCOUNTER — Encounter: Payer: Self-pay | Admitting: Physician Assistant

## 2024-05-08 ENCOUNTER — Other Ambulatory Visit: Payer: Self-pay | Admitting: Physician Assistant

## 2024-05-11 ENCOUNTER — Other Ambulatory Visit: Payer: Self-pay | Admitting: Physician Assistant

## 2024-05-15 ENCOUNTER — Encounter: Payer: Self-pay | Admitting: Physician Assistant

## 2024-05-15 ENCOUNTER — Ambulatory Visit: Admitting: Physician Assistant

## 2024-05-15 VITALS — BP 110/60 | HR 95 | Temp 97.3°F | Ht 66.0 in | Wt 200.2 lb

## 2024-05-15 DIAGNOSIS — E1159 Type 2 diabetes mellitus with other circulatory complications: Secondary | ICD-10-CM

## 2024-05-15 DIAGNOSIS — I152 Hypertension secondary to endocrine disorders: Secondary | ICD-10-CM

## 2024-05-15 DIAGNOSIS — N1831 Chronic kidney disease, stage 3a: Secondary | ICD-10-CM

## 2024-05-15 DIAGNOSIS — Z7984 Long term (current) use of oral hypoglycemic drugs: Secondary | ICD-10-CM

## 2024-05-15 DIAGNOSIS — C7A090 Malignant carcinoid tumor of the bronchus and lung: Secondary | ICD-10-CM | POA: Diagnosis not present

## 2024-05-15 DIAGNOSIS — E119 Type 2 diabetes mellitus without complications: Secondary | ICD-10-CM

## 2024-05-15 MED ORDER — ALPRAZOLAM 0.25 MG PO TABS: 0.2500 mg | ORAL_TABLET | Freq: Every evening | ORAL | 0 refills | Status: AC | PRN

## 2024-05-15 NOTE — Progress Notes (Signed)
 Marie Rhodes is a 88 y.o. female here for a follow up of a pre-existing problem.  History of Present Illness:   Chief Complaint  Patient presents with   Medical Management of Chronic Issues    Pt here for f/u Diabetes, Hypertension.    Discussed the use of AI scribe software for clinical note transcription with the patient, who gave verbal consent to proceed.  History of Present Illness Marie Rhodes is an 88 year old female with diabetes and chronic kidney disease who presents for routine follow-up and A1c check.  She takes Farxiga  5 mg daily and losartan  25 mg daily for hypertension. She takes repaglinide  1 mg twice daily before breakfast and dinner for diabetes mellitus. She follows with nephrology closely. She is also on pravastatin  20 mg daily for hyperlipidemia and takes iron twice daily for anemia. She does not regularly check her blood sugars due to cost but does not experience hypoglycemia symptoms.  Her lung health includes two recent contrast scans post-radiation, with a slight enlargement noted but not concerning. She was treated for suspected bronchitis and possible pneumonia in June 2025 with a Z-Pak, which resolved her symptoms. She will be establishing with pulmonary soon.    Past Medical History:  Diagnosis Date   ANXIETY 04/27/2007   Arthritis    right big toe   Cancer (HCC)    early stage squamous cell - leg   Chronic kidney disease    Stage 3 b - now stage 3 A   COVID 2020   high fever -   CROHN'S DISEASE, SMALL INTESTINE 06/08/2008   ileum, cecum   Diabetes mellitus without complication (HCC)    Fatty liver 1992   never had problems with it   Fatty tumor    left shoulder   GOITER, MULTINODULAR 05/04/2008   Hyperlipemia    Hypertension    Molar pregnancy 1967   OBESITY 08/01/2010   Pneumonia    Thyroid  nodule    UNSPECIFIED ANEMIA 05/04/2008   Mild Anemia   Vitamin D  deficiency 04/08/2018     Social History   Tobacco Use   Smoking status:  Former    Current packs/day: 0.00    Types: Cigarettes    Quit date: 05/29/1988    Years since quitting: 35.9    Passive exposure: Past   Smokeless tobacco: Never  Vaping Use   Vaping status: Never Used  Substance Use Topics   Alcohol use: No   Drug use: No    Past Surgical History:  Procedure Laterality Date   APPENDECTOMY     BIOPSY THYROID      BRONCHIAL BIOPSY  11/24/2022   Procedure: BRONCHIAL BIOPSIES;  Surgeon: Brenna Adine CROME, DO;  Location: [facility];  Service: Pulmonary;;   BRONCHIAL BRUSHINGS  11/24/2022   Procedure: BRONCHIAL BRUSHINGS;  Surgeon: Brenna Adine CROME, DO;  Location: [facility];  Service: Pulmonary;;   BRONCHIAL NEEDLE ASPIRATION BIOPSY  11/24/2022   Procedure: BRONCHIAL NEEDLE ASPIRATION BIOPSIES;  Surgeon: Brenna Adine CROME, DO;  Location: [facility];  Service: Pulmonary;;   COLONOSCOPY  08/10/2006   normal   LEFT OOPHORECTOMY  1992   MECKEL DIVERTICULUM EXCISION     Ileal and cecal resection     Family History  Problem Relation Age of Onset   Breast cancer Mother 31   Kidney disease Mother        Dialysis   Diabetes Mother    Kidney cancer Mother  Renal Cell Carcinoma   Skin cancer Father        mets   Diabetes Father    Melanoma Sister    Diabetes Brother    Tongue cancer Brother 20   Stroke Maternal Grandmother    Heart disease Paternal Grandmother    Colon cancer Paternal Uncle     Allergies  Allergen Reactions   Tylenol  With Codeine  #3 [Acetaminophen -Codeine ] Other (See Comments)    Constipation    Current Medications:   Current Outpatient Medications:    acetaminophen  (TYLENOL ) 500 MG tablet, Take 1,000 mg by mouth every 6 (six) hours as needed for moderate pain., Disp: , Rfl:    alum & mag hydroxide-simeth (MAALOX/MYLANTA) 200-200-20 MG/5ML suspension, Take 15 mLs by mouth every 6 (six) hours as needed for indigestion or heartburn., Disp: , Rfl:    cholecalciferol (VITAMIN D3) 25 MCG (1000 UNIT) tablet, Take 1,000  Units by mouth daily., Disp: , Rfl:    dapagliflozin  propanediol (FARXIGA ) 5 MG TABS tablet, Take 1 tablet (5 mg total) by mouth daily., Disp: 90 tablet, Rfl: 3   ferrous sulfate  325 (65 FE) MG tablet, Take 1 tablet (325 mg total) by mouth 2 (two) times daily., Disp: , Rfl: 3   glucose blood test strip, , Disp: , Rfl:    lidocaine  (ASPERCREME LIDOCAINE ) 4 %, Place 1 patch onto the skin every Wednesday., Disp: , Rfl:    losartan  (COZAAR ) 25 MG tablet, TAKE ONE TABLET BY MOUTH DAILY, Disp: 30 tablet, Rfl: 0   Multiple Vitamin (MULTIVITAMIN) tablet, Take 1 tablet by mouth at bedtime., Disp: , Rfl:    pravastatin  (PRAVACHOL ) 20 MG tablet, Take 1 tablet (20 mg total) by mouth daily., Disp: 90 tablet, Rfl: 1   repaglinide  (PRANDIN ) 1 MG tablet, Take 1 tablet (1 mg total) by mouth 2 (two) times daily before a meal., Disp: 180 tablet, Rfl: 1   vitamin B-12 (CYANOCOBALAMIN) 1000 MCG tablet, Take 1,000 mcg by mouth daily. (Patient taking differently: Take 1,000 mcg by mouth 2 (two) times daily.), Disp: , Rfl:    ALPRAZolam  (XANAX ) 0.25 MG tablet, Take 1 tablet (0.25 mg total) by mouth at bedtime as needed for anxiety., Disp: 30 tablet, Rfl: 0   Review of Systems:   Negative unless otherwise specified per HPI.  Vitals:   Vitals:   05/15/24 1446  BP: 110/60  Pulse: 95  Temp: (!) 97.3 F (36.3 C)  TempSrc: Temporal  SpO2: 96%  Weight: 200 lb 4 oz (90.8 kg)  Height: 5' 6 (1.676 m)     Body mass index is 32.32 kg/m.  Physical Exam:   Physical Exam Vitals and nursing note reviewed.  Constitutional:      General: She is not in acute distress.    Appearance: She is well-developed. She is not ill-appearing or toxic-appearing.  Cardiovascular:     Rate and Rhythm: Normal rate and regular rhythm.     Pulses: Normal pulses.     Heart sounds: Normal heart sounds, S1 normal and S2 normal.  Pulmonary:     Effort: Pulmonary effort is normal.     Breath sounds: Normal breath sounds.  Skin:     General: Skin is warm and dry.  Neurological:     Mental Status: She is alert.     GCS: GCS eye subscore is 4. GCS verbal subscore is 5. GCS motor subscore is 6.  Psychiatric:        Speech: Speech normal.  Behavior: Behavior normal. Behavior is cooperative.     Assessment and Plan:   Assessment and Plan Assessment & Plan Type 2 diabetes mellitus with stage 3a chronic kidney disease Chronic condition with kidney function at 49%. Blood pressure controlled with losartan . Farxiga  and repaglinide  effective without hypoglycemia. Farxiga  affordable through [facility]. - Check A1c level. - Continue Farxiga  5 mg and repaglinide  as prescribed. Will adjust repaglinide  based on Hemoglobin A1c. - Continue losartan  25 mg daily for blood pressure control.  Essential hypertension Blood pressure well-controlled with current medication. - Continue losartan  for blood pressure control.  History of malignant carcinoid tumor of the lung Lung carcinoid tumor with slight enlargement. Recent imaging and urgent care visit indicated possible pneumonia, treated with Z-Pak. Referred to pulmonologist Dr. Cheryal. - Follow up with pulmonologist in early September for further evaluation. - Continue close follow up with rad oncology      Lucie Buttner, PA-C

## 2024-05-15 NOTE — Patient Instructions (Addendum)
 It was great to see you!  I'm so glad that you are doing well! We will be in touch with all results.  Let's follow-up in 6 months, sooner if you have concerns.  Take care,  Lucie Buttner PA-C

## 2024-05-16 ENCOUNTER — Ambulatory Visit: Payer: Self-pay | Admitting: Physician Assistant

## 2024-05-16 LAB — MICROALBUMIN / CREATININE URINE RATIO
Creatinine,U: 84.2 mg/dL
Microalb Creat Ratio: 10 mg/g (ref 0.0–30.0)
Microalb, Ur: 0.8 mg/dL (ref 0.0–1.9)

## 2024-05-16 LAB — LIPID PANEL
Cholesterol: 161 mg/dL (ref 0–200)
HDL: 50.2 mg/dL (ref >39.00)
LDL Cholesterol: 50 mg/dL (ref 0–99)
NonHDL: 110.62
Total CHOL/HDL Ratio: 3
Triglycerides: 301 mg/dL — ABNORMAL HIGH (ref 0.0–149.0)
VLDL: 60.2 mg/dL — ABNORMAL HIGH (ref 0.0–40.0)

## 2024-05-16 LAB — COMPREHENSIVE METABOLIC PANEL WITH GFR
ALT: 25 U/L (ref 0–35)
AST: 29 U/L (ref 0–37)
Albumin: 4.4 g/dL (ref 3.5–5.2)
Alkaline Phosphatase: 95 U/L (ref 39–117)
BUN: 26 mg/dL — ABNORMAL HIGH (ref 6–23)
CO2: 23 meq/L (ref 19–32)
Calcium: 10.4 mg/dL (ref 8.4–10.5)
Chloride: 106 meq/L (ref 96–112)
Creatinine, Ser: 1.1 mg/dL (ref 0.40–1.20)
GFR: 45.68 mL/min — ABNORMAL LOW (ref >60.00)
Glucose, Bld: 85 mg/dL (ref 70–99)
Potassium: 4.1 meq/L (ref 3.5–5.1)
Sodium: 139 meq/L (ref 135–145)
Total Bilirubin: 0.6 mg/dL (ref 0.2–1.2)
Total Protein: 7 g/dL (ref 6.0–8.3)

## 2024-05-16 LAB — HEMOGLOBIN A1C: Hgb A1c MFr Bld: 7.1 % — ABNORMAL HIGH (ref 4.6–6.5)

## 2024-06-01 ENCOUNTER — Other Ambulatory Visit: Payer: Self-pay | Admitting: Physician Assistant

## 2024-06-09 ENCOUNTER — Encounter: Payer: Self-pay | Admitting: Emergency Medicine

## 2024-06-09 ENCOUNTER — Other Ambulatory Visit: Payer: Self-pay | Admitting: Physician Assistant

## 2024-06-09 ENCOUNTER — Ambulatory Visit: Admitting: Emergency Medicine

## 2024-06-09 VITALS — BP 130/78 | HR 109 | Temp 98.5°F | Ht 66.0 in | Wt 203.8 lb

## 2024-06-09 DIAGNOSIS — C7A090 Malignant carcinoid tumor of the bronchus and lung: Secondary | ICD-10-CM | POA: Diagnosis not present

## 2024-06-09 DIAGNOSIS — R052 Subacute cough: Secondary | ICD-10-CM

## 2024-06-09 NOTE — Progress Notes (Signed)
 Subjective:    Patient ID: Marie Rhodes, female    DOB: 08-07-1936, 88 y.o.   MRN: [id number]  HPI Marie Rhodes is an 88 year old former smoker (minimal) who has been followed in our office for a right 2.6 cm perihilar nodule that was biopsied 10/2022 and found to be consistent with a typical carcinoid tumor.  She underwent SBRT 12/2022 with most recent surveillance CT chest done 02/2024 as below. PMH: Diabetes, Crohn's disease, CKD stage IIIA, hypertension, goiter, anemia Today she reports that she is doing well. She still works 1 day a week. She is not having any dyspnea. She is able to exert, does get SOB w yard work. She cooks, does her grocery shopping. Minimal cough at baseline, does catch bronchitis a few times a year.  She is not on any bronchodilator therapy currently. She has used albuterol  before when she has bronchitis.   CT scan of the chest 03/23/2024 reviewed by me, shows no mediastinal or hilar adenopathy, 2.1 x 2.2 cm right hilar nodule.  There is some platelike consolidation consistent with radiation change.  No new pulmonary nodules.   Review of Systems As per HPI  Past Medical History:  Diagnosis Date   ANXIETY 04/27/2007   Arthritis    right big toe   Cancer (HCC)    early stage squamous cell - leg   Chronic kidney disease    Stage 3 b - now stage 3 A   COVID 2020   high fever -   CROHN'S DISEASE, SMALL INTESTINE 06/08/2008   ileum, cecum   Diabetes mellitus without complication (HCC)    Fatty liver 1992   never had problems with it   Fatty tumor    left shoulder   GOITER, MULTINODULAR 05/04/2008   Hyperlipemia    Hypertension    Molar pregnancy 1967   OBESITY 08/01/2010   Pneumonia    Thyroid  nodule    UNSPECIFIED ANEMIA 05/04/2008   Mild Anemia   Vitamin D  deficiency 04/08/2018     Family History  Problem Relation Age of Onset   Breast cancer Mother 72   Kidney disease Mother        Dialysis   Diabetes Mother    Kidney cancer Mother         Renal Cell Carcinoma   Skin cancer Father        mets   Diabetes Father    Melanoma Sister    Diabetes Brother    Tongue cancer Brother 82   Stroke Maternal Grandmother    Heart disease Paternal Grandmother    Colon cancer Paternal Uncle      Social History   Socioeconomic History   Marital status: Widowed    Spouse name: Not on file   Number of children: Not on file   Years of education: Not on file   Highest education level: Not on file  Occupational History   Occupation: retired Runner, broadcasting/film/video    Comment: retired Printmaker)  Tobacco Use   Smoking status: Former    Current packs/day: 0.00    Types: Cigarettes    Quit date: 05/29/1988    Years since quitting: 36.0    Passive exposure: Past   Smokeless tobacco: Never  Vaping Use   Vaping status: Never Used  Substance and Sexual Activity   Alcohol use: No   Drug use: No   Sexual activity: Not on file  Other Topics Concern   Not on file  Social History Narrative  Patient is widowed --husband died from brain aneurysm, she is a retired Runner, 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   Former Games developer (college only), no alcohol or drug use   Both of her grandchildren lived with her while they went to college    As of November 2019, she lives alone   Social Drivers of Corporate investment banker Strain: Low Risk  (04/11/2024)   Overall Financial Resource Strain (CARDIA)    Difficulty of Paying Living Expenses: Not hard at all  Food Insecurity: No Food Insecurity (04/11/2024)   Hunger Vital Sign    Worried About Running Out of Food in the Last Year: Never true    Ran Out of Food in the Last Year: Never true  Transportation Needs: No Transportation Needs (04/11/2024)   PRAPARE - Administrator, Civil Service (Medical): No    Lack of Transportation (Non-Medical): No  Physical Activity: Inactive (04/11/2024)   Exercise Vital Sign    Days of Exercise per Week: 0 days    Minutes of  Exercise per Session: 0 min  Stress: No Stress Concern Present (04/11/2024)   [employer] of Occupational Health - Occupational Stress Questionnaire    Feeling of Stress: Not at all  Social Connections: Socially Isolated (04/11/2024)   Social Connection and Isolation Panel    Frequency of Communication with Friends and Family: More than three times a week    Frequency of Social Gatherings with Friends and Family: More than three times a week    Attends Religious Services: Never    Database administrator or Organizations: No    Attends Banker Meetings: Never    Marital Status: Widowed  Intimate Partner Violence: Not At Risk (04/11/2024)   Humiliation, Afraid, Rape, and Kick questionnaire    Fear of Current or Ex-Partner: No    Emotionally Abused: No    Physically Abused: No    Sexually Abused: No     Allergies  Allergen Reactions   Tylenol  With Codeine  #3 [Acetaminophen -Codeine ] Other (See Comments)    Constipation     Outpatient Medications Prior to Visit  Medication Sig Dispense Refill   acetaminophen  (TYLENOL ) 500 MG tablet Take 1,000 mg by mouth every 6 (six) hours as needed for moderate pain.     ALPRAZolam  (XANAX ) 0.25 MG tablet Take 1 tablet (0.25 mg total) by mouth at bedtime as needed for anxiety. 30 tablet 0   alum & mag hydroxide-simeth (MAALOX/MYLANTA) 200-200-20 MG/5ML suspension Take 15 mLs by mouth every 6 (six) hours as needed for indigestion or heartburn.     cholecalciferol (VITAMIN D3) 25 MCG (1000 UNIT) tablet Take 1,000 Units by mouth daily.     dapagliflozin  propanediol (FARXIGA ) 5 MG TABS tablet Take 1 tablet (5 mg total) by mouth daily. 90 tablet 3   ferrous sulfate  325 (65 FE) MG tablet Take 1 tablet (325 mg total) by mouth 2 (two) times daily.  3   glucose blood test strip      lidocaine  (ASPERCREME LIDOCAINE ) 4 % Place 1 patch onto the skin every Wednesday.     losartan  (COZAAR ) 25 MG tablet TAKE ONE TABLET BY MOUTH DAILY 30 tablet 0    Multiple Vitamin (MULTIVITAMIN) tablet Take 1 tablet by mouth at bedtime.     pravastatin  (PRAVACHOL ) 20 MG tablet Take 1 tablet (20 mg total) by mouth daily. 90 tablet 1   repaglinide  (PRANDIN ) 1 MG tablet Take  1 tablet (1 mg total) by mouth 2 (two) times daily before a meal. 180 tablet 1   vitamin B-12 (CYANOCOBALAMIN) 1000 MCG tablet Take 1,000 mcg by mouth daily. (Patient taking differently: Take 1,000 mcg by mouth 2 (two) times daily.)     No facility-administered medications prior to visit.        Objective:   Physical Exam Vitals:   06/09/24 0923  BP: 130/78  Pulse: (!) 109  Temp: 98.5 F (36.9 C)  TempSrc: Oral  SpO2: 95%  Weight: 203 lb 12.8 oz (92.4 kg)  Height: 5' 6 (1.676 m)   Gen: Pleasant, well-nourished, in no distress,  normal affect  ENT: No lesions,  mouth clear,  oropharynx clear, no postnasal drip  Neck: No JVD, no stridor  Lungs: No use of accessory muscles, subtle inspiratory squeak on the right.  Good air movement.  No wheezing or crackles.  Cardiovascular: RRR, heart sounds normal, no murmur or gallops, no peripheral edema  Musculoskeletal: No deformities, no cyanosis or clubbing  Neuro: alert, awake, non focal  Skin: Warm, no lesions or rash      Assessment & Plan:   Malignant carcinoid tumor of right lung [facility]) Reviewed her CT scans of the chest with her today.  No evidence of progression.  She does have some postradiation parenchymal change, likely scar.  I do not see any evidence of bronchiectasis or pneumonia.  We will be able to follow this area for any evidence of progression on her surveillance imaging, next scan is to be done in January 2026.  Cough No cough currently.  She does have a history of occasional bronchitis.  Asked her to contact us  if she becomes symptomatic.  Time spent 38 minutes reviewing imaging, history, symptoms, surveillance plan.  Lamar Chris, MD, PhD 06/09/2024, 9:53 AM [facility] [phone] or if no answer before 7:00PM call [phone] For any issues after 7:00PM please call eLink [phone]

## 2024-06-09 NOTE — Assessment & Plan Note (Signed)
 Reviewed her CT scans of the chest with her today.  No evidence of progression.  She does have some postradiation parenchymal change, likely scar.  I do not see any evidence of bronchiectasis or pneumonia.  We will be able to follow this area for any evidence of progression on her surveillance imaging, next scan is to be done in January 2026.

## 2024-06-09 NOTE — Patient Instructions (Signed)
 It is a pleasure to meet you today. We reviewed your CT scans of the chest.  These are stable following your radiation treatment with a small amount of post radiation scarring in that region.  Good news. Get your repeat CT chest in January 2026 and subsequent CTs as planned by radiation oncology Follow Dr. Shelah in June 2026 or please call sooner if you have any new symptoms or respiratory problems.

## 2024-06-09 NOTE — Assessment & Plan Note (Signed)
 No cough currently.  She does have a history of occasional bronchitis.  Asked her to contact us  if she becomes symptomatic.

## 2024-07-21 ENCOUNTER — Other Ambulatory Visit: Payer: Self-pay | Admitting: Physician Assistant

## 2024-08-07 DIAGNOSIS — J208 Acute bronchitis due to other specified organisms: Secondary | ICD-10-CM | POA: Diagnosis not present

## 2024-08-07 DIAGNOSIS — J019 Acute sinusitis, unspecified: Secondary | ICD-10-CM | POA: Diagnosis not present

## 2024-09-25 ENCOUNTER — Telehealth: Payer: Self-pay | Admitting: *Deleted

## 2024-09-25 NOTE — Progress Notes (Signed)
 Follow up call to discuss results from CT chest scan on 09/29/24.  Nursing interview completed. Patient states no pain or complaints today.   IMPRESSION: 1. Mild decrease in size of right perihilar lung nodule, now measuring 23 mm x 18 mm compared to 22 mm x 21 mm. 2. Mild improvement in perihilar bandlike radiation consolidation in the right lung. 3. No new pulmonary nodules. 4. No evidence of metastatic disease.

## 2024-09-25 NOTE — Telephone Encounter (Signed)
 Called patient to inform of CT for 09-29-24- arrival time- 4:45 pm m @ [facility], no restrictions to scan, patient to receive results from PA Ashlyn Bruning on 10-04-24 @ 10:30 am via telephone, spoke with patient and she is aware of these appts. and the instructions

## 2024-09-29 ENCOUNTER — Ambulatory Visit (HOSPITAL_COMMUNITY)
Admission: RE | Admit: 2024-09-29 | Discharge: 2024-09-29 | Disposition: A | Discharge status: Home / Self Care | Source: Ambulatory Visit | Attending: Urology | Admitting: Urology

## 2024-09-29 DIAGNOSIS — C7A090 Malignant carcinoid tumor of the bronchus and lung: Secondary | ICD-10-CM | POA: Insufficient documentation

## 2024-09-29 LAB — POCT I-STAT CREATININE: Creatinine, Ser: 1.3 mg/dL — ABNORMAL HIGH (ref 0.44–1.00)

## 2024-09-29 MED ORDER — IOHEXOL 300 MG/ML  SOLN
60.0000 mL | Freq: Once | INTRAMUSCULAR | Status: AC | PRN
  Administered 2024-09-29: 60 mL via INTRAVENOUS

## 2024-10-04 ENCOUNTER — Ambulatory Visit
Admission: RE | Admit: 2024-10-04 | Discharge: 2024-10-04 | Disposition: A | Discharge status: Home / Self Care | Source: Ambulatory Visit | Attending: Urology | Admitting: Urology

## 2024-10-04 DIAGNOSIS — C7A090 Malignant carcinoid tumor of the bronchus and lung: Secondary | ICD-10-CM

## 2024-10-04 NOTE — Progress Notes (Signed)
 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  The patient was notified in advance and gave permission to proceed with this visit format.  She tolerated radiation treatment relatively well with only modest fatigue. She had a recent post-treatment CT Chest on 09/29/2024 which shows a mild decrease in the size of the treated right perihilar lung nodule and mild improvement in the perihilar bandlike radiation consolidation in the right lung with no new nodules, lymphadenopathy or other findings to suggest disease recurrence or progression. We reviewed these findings by telephone today.                             On review of systems, the patient states that she continues doing very well in general and is without complaints. She denies any increased shortness of breath, productive cough, hemoptysis, chest pain, fever or chills. She has a healthy appetite and is maintaining her weight and staying active.  She continues working 1 day a week at the [employer] which she truly enjoys.  Overall, she is quite pleased with her progress to date.  ALLERGIES:  is allergic to tylenol  with codeine  #3 [acetaminophen -codeine ].  Meds: Current Outpatient Medications  Medication Sig Dispense Refill   acetaminophen  (TYLENOL ) 500 MG tablet Take 1,000 mg by mouth every 6 (six) hours  as needed for moderate pain.     ALPRAZolam  (XANAX ) 0.25 MG tablet Take 1 tablet (0.25 mg total) by mouth at bedtime as needed for anxiety. 30 tablet 0   alum & mag hydroxide-simeth (MAALOX/MYLANTA) 200-200-20 MG/5ML suspension Take 15 mLs by mouth every 6 (six) hours as needed for indigestion or heartburn.     cholecalciferol (VITAMIN D3) 25 MCG (1000 UNIT) tablet Take 1,000 Units by mouth daily.     dapagliflozin  propanediol (FARXIGA ) 5 MG TABS tablet Take 1 tablet (5 mg total) by mouth daily. 90 tablet 3   ferrous sulfate  325 (65 FE) MG tablet Take 1 tablet (325 mg total) by mouth 2 (two) times daily.  3   glucose blood test strip      lidocaine  (ASPERCREME LIDOCAINE ) 4 % Place 1 patch onto the skin every Wednesday.     losartan  (COZAAR ) 25 MG tablet TAKE ONE TABLET BY MOUTH DAILY 30 tablet 5   Multiple Vitamin (MULTIVITAMIN) tablet Take 1 tablet by mouth at bedtime.     pravastatin  (PRAVACHOL ) 20 MG tablet Take 1 tablet (20 mg total) by mouth daily. 90 tablet 1   repaglinide  (PRANDIN ) 1 MG tablet Take 1 tablet (1 mg total) by mouth 2 (two) times daily before a meal. 180 tablet 1   vitamin B-12 (CYANOCOBALAMIN) 1000 MCG tablet Take 1,000 mcg by mouth daily. (Patient taking differently: Take 1,000 mcg  by mouth 2 (two) times daily.)     No current facility-administered medications for this encounter.    Physical Findings:  vitals were not taken for this visit.  Pain Assessment Pain Score: 0-No pain/10  Unable to assess due to telephone follow up visit format.  Lab Findings: Lab Results  Component Value Date   WBC 7.9 11/24/2022   HGB 16.9 (A) 01/31/2024   HCT 42.6 11/24/2022   MCV 92.4 11/24/2022   PLT 194 11/24/2022     Radiographic Findings: CT Chest W Contrast Result Date: 10/03/2024 EXAM: CT CHEST WITH CONTRAST 09/29/2024 05:18:51 PM TECHNIQUE: CT of the chest was performed with the administration of 60 mL of iohexol  (OMNIPAQUE ) 300 MG/ML solution. Multiplanar reformatted  images are provided for review. Automated exposure control, iterative reconstruction, and/or weight based adjustment of the mA/kV was utilized to reduce the radiation dose to as low as reasonably achievable. COMPARISON: CT chest 03/23/2024. CLINICAL HISTORY: Neuroendocrine tumor (NET); disease restaging for carcinoid lung tumor s/p SBRT RUL 12/2022. * Tracking Code: BO *. FINDINGS: MEDIASTINUM: Heart and pericardium are unremarkable. The central airways are clear. LYMPH NODES: No mediastinal, hilar or axillary lymphadenopathy. LUNGS AND PLEURA: Right perihilar lung nodule measures 23 x 18 mm, compared to 22 x 21 mm on the comparison exam. Perihilar bandlike consolidation is slightly improved from the comparison exam. No new pulmonary nodules. No focal consolidation or pulmonary edema. No pleural effusion or pneumothorax. SOFT TISSUES/BONES: Nodular enlargement of the left lobe of the thyroid  gland with associated calcifications is stable compared to prior exams, most consistent with multinodular goiter. No acute abnormality of the bones or soft tissues. UPPER ABDOMEN: Limited images of the upper abdomen demonstrates no acute abnormality. IMPRESSION: 1. Mild decrease in size of right perihilar lung nodule, now measuring 23 mm x 18 mm compared to 22 mm x 21 mm. 2. Mild improvement in perihilar bandlike radiation consolidation in the right lung. 3. No new pulmonary nodules. 4. No evidence of metastatic disease. Electronically signed by: Norleen Boxer MD 10/03/2024 09:24 AM EST RP Workstation: [id number]    Impression/Plan: 1. 89 yo woman with history of a 2.5 cm malignant carcinoid tumor of the right middle lobe of the lung. She has recovered well from the effects of the SBRT and is currently without complaints.  Her recent posttreatment CT chest from 09/29/2024 shows a mild decrease in the size of the treated right perihilar lung nodule and mild improvement in the perihilar bandlike radiation consolidation in the  right lung with no new nodules, lymphadenopathy or other findings to suggest disease recurrence or progression.  Therefore, we will plan to continue with serial CT chest scans every 6 months until we reach 5 years disease-free, at which time we will then transition to annual low-dose screening scans.  I will plan to call her following each scan to review results and any recommendations and she knows to call at anytime in the interim with any questions or concerns. Her pulmonologist, Dr. Brenna, has left the practice but she has recently established care with Dr. Shelah for continued routine follow up care.   I personally spent 30 minutes in this encounter including chart review, reviewing radiological studies, telephone conversation with the patient, entering orders, coordinating care and completing documentation.     Sabra MICAEL Rusk, PA-C  "

## 2024-10-12 ENCOUNTER — Other Ambulatory Visit: Payer: Self-pay | Admitting: Physician Assistant

## 2024-10-12 DIAGNOSIS — E119 Type 2 diabetes mellitus without complications: Secondary | ICD-10-CM

## 2024-12-04 ENCOUNTER — Ambulatory Visit: Admitting: Physician Assistant

## 2025-04-11 ENCOUNTER — Ambulatory Visit: Admitting: Urology

## 2025-04-19 ENCOUNTER — Ambulatory Visit
